# Patient Record
Sex: Male | Born: 1940 | Race: White | Hispanic: No | Marital: Married | Smoking: Current every day smoker
Health system: Southern US, Community
[De-identification: ages and names within clinical notes are randomized; demographics above are authoritative.]

## PROBLEM LIST (undated history)

## (undated) DIAGNOSIS — I10 Essential (primary) hypertension: Secondary | ICD-10-CM

## (undated) DIAGNOSIS — N289 Disorder of kidney and ureter, unspecified: Secondary | ICD-10-CM

## (undated) DIAGNOSIS — C801 Malignant (primary) neoplasm, unspecified: Secondary | ICD-10-CM

## (undated) DIAGNOSIS — E119 Type 2 diabetes mellitus without complications: Secondary | ICD-10-CM

## (undated) DIAGNOSIS — I729 Aneurysm of unspecified site: Secondary | ICD-10-CM

## (undated) HISTORY — PX: JOINT REPLACEMENT: SHX530

## (undated) HISTORY — PX: TOTAL HIP REVISION: SHX763

## (undated) HISTORY — PX: HERNIA REPAIR: SHX51

---

## 2011-06-24 DIAGNOSIS — T84039A Mechanical loosening of unspecified internal prosthetic joint, initial encounter: Secondary | ICD-10-CM | POA: Insufficient documentation

## 2011-06-24 DIAGNOSIS — T84059A Periprosthetic osteolysis of unspecified internal prosthetic joint, initial encounter: Secondary | ICD-10-CM | POA: Insufficient documentation

## 2011-12-24 ENCOUNTER — Ambulatory Visit: Payer: Self-pay

## 2012-01-07 ENCOUNTER — Ambulatory Visit: Payer: Self-pay

## 2012-02-07 ENCOUNTER — Ambulatory Visit: Payer: Self-pay

## 2014-04-14 ENCOUNTER — Inpatient Hospital Stay: Payer: Self-pay | Admitting: Vascular Surgery

## 2014-04-14 LAB — CBC WITH DIFFERENTIAL/PLATELET
Basophil #: 0 10*3/uL (ref 0.0–0.1)
Basophil %: 0.4 %
EOS PCT: 0.1 %
Eosinophil #: 0 10*3/uL (ref 0.0–0.7)
HCT: 34.5 % — ABNORMAL LOW (ref 40.0–52.0)
HGB: 11.3 g/dL — ABNORMAL LOW (ref 13.0–18.0)
LYMPHS ABS: 0.5 10*3/uL — AB (ref 1.0–3.6)
Lymphocyte %: 4.4 %
MCH: 31.1 pg (ref 26.0–34.0)
MCHC: 32.9 g/dL (ref 32.0–36.0)
MCV: 95 fL (ref 80–100)
MONOS PCT: 8.4 %
Monocyte #: 0.9 x10 3/mm (ref 0.2–1.0)
Neutrophil #: 9.4 10*3/uL — ABNORMAL HIGH (ref 1.4–6.5)
Neutrophil %: 86.7 %
Platelet: 142 10*3/uL — ABNORMAL LOW (ref 150–440)
RBC: 3.64 10*6/uL — AB (ref 4.40–5.90)
RDW: 15 % — AB (ref 11.5–14.5)
WBC: 10.9 10*3/uL — AB (ref 3.8–10.6)

## 2014-04-14 LAB — COMPREHENSIVE METABOLIC PANEL
ALBUMIN: 3.2 g/dL — AB (ref 3.4–5.0)
ALK PHOS: 36 U/L — AB
ANION GAP: 10 (ref 7–16)
ANION GAP: 8 (ref 7–16)
Albumin: 2.7 g/dL — ABNORMAL LOW (ref 3.4–5.0)
Alkaline Phosphatase: 44 U/L — ABNORMAL LOW
BILIRUBIN TOTAL: 0.4 mg/dL (ref 0.2–1.0)
BUN: 19 mg/dL — ABNORMAL HIGH (ref 7–18)
BUN: 19 mg/dL — ABNORMAL HIGH (ref 7–18)
Bilirubin,Total: 0.3 mg/dL (ref 0.2–1.0)
CALCIUM: 8.5 mg/dL (ref 8.5–10.1)
CHLORIDE: 108 mmol/L — AB (ref 98–107)
CREATININE: 1.67 mg/dL — AB (ref 0.60–1.30)
Calcium, Total: 7.5 mg/dL — ABNORMAL LOW (ref 8.5–10.1)
Chloride: 105 mmol/L (ref 98–107)
Co2: 26 mmol/L (ref 21–32)
Co2: 25 mmol/L (ref 21–32)
Creatinine: 1.78 mg/dL — ABNORMAL HIGH (ref 0.60–1.30)
EGFR (African American): 48 — ABNORMAL LOW
EGFR (Non-African Amer.): 40 — ABNORMAL LOW
EGFR (Non-African Amer.): 43 — ABNORMAL LOW
GFR CALC AF AMER: 52 — AB
Glucose: 256 mg/dL — ABNORMAL HIGH (ref 65–99)
Glucose: 222 mg/dL — ABNORMAL HIGH (ref 65–99)
OSMOLALITY: 292 (ref 275–301)
OSMOLALITY: 290 (ref 275–301)
POTASSIUM: 3.7 mmol/L (ref 3.5–5.1)
Potassium: 4.7 mmol/L (ref 3.5–5.1)
SGOT(AST): 33 U/L (ref 15–37)
SGOT(AST): 25 U/L (ref 15–37)
SGPT (ALT): 46 U/L
SGPT (ALT): 39 U/L
SODIUM: 141 mmol/L (ref 136–145)
Sodium: 141 mmol/L (ref 136–145)
TOTAL PROTEIN: 6.3 g/dL — AB (ref 6.4–8.2)
Total Protein: 5.5 g/dL — ABNORMAL LOW (ref 6.4–8.2)

## 2014-04-14 LAB — MAGNESIUM: MAGNESIUM: 1.4 mg/dL — AB

## 2014-04-14 LAB — CBC
HCT: 38.6 % — AB (ref 40.0–52.0)
HGB: 12.3 g/dL — ABNORMAL LOW (ref 13.0–18.0)
MCH: 30.8 pg (ref 26.0–34.0)
MCHC: 31.9 g/dL — ABNORMAL LOW (ref 32.0–36.0)
MCV: 97 fL (ref 80–100)
Platelet: 205 10*3/uL (ref 150–440)
RBC: 4.01 10*6/uL — ABNORMAL LOW (ref 4.40–5.90)
RDW: 13.9 % (ref 11.5–14.5)
WBC: 15.2 10*3/uL — AB (ref 3.8–10.6)

## 2014-04-14 LAB — URINALYSIS, COMPLETE
Bacteria: NONE SEEN
Bilirubin,UR: NEGATIVE
Blood: NEGATIVE
Glucose,UR: 50 mg/dL (ref 0–75)
Ketone: NEGATIVE
LEUKOCYTE ESTERASE: NEGATIVE
NITRITE: NEGATIVE
PH: 5 (ref 4.5–8.0)
Protein: 30
SPECIFIC GRAVITY: 1.058 (ref 1.003–1.030)
Squamous Epithelial: NONE SEEN

## 2014-04-14 LAB — LIPASE, BLOOD: Lipase: 169 U/L (ref 73–393)

## 2014-04-14 LAB — PROTIME-INR
INR: 1.1
INR: 1.2
Prothrombin Time: 13.9 secs (ref 11.5–14.7)
Prothrombin Time: 14.7 secs (ref 11.5–14.7)

## 2014-04-15 LAB — CBC WITH DIFFERENTIAL/PLATELET
BASOS ABS: 0 10*3/uL (ref 0.0–0.1)
Basophil %: 0.1 %
EOS ABS: 0 10*3/uL (ref 0.0–0.7)
Eosinophil %: 0 %
HCT: 31 % — AB (ref 40.0–52.0)
HGB: 10 g/dL — AB (ref 13.0–18.0)
Lymphocyte #: 0.9 10*3/uL — ABNORMAL LOW (ref 1.0–3.6)
Lymphocyte %: 6.7 %
MCH: 30.5 pg (ref 26.0–34.0)
MCHC: 32.3 g/dL (ref 32.0–36.0)
MCV: 95 fL (ref 80–100)
MONOS PCT: 11.9 %
Monocyte #: 1.6 x10 3/mm — ABNORMAL HIGH (ref 0.2–1.0)
Neutrophil #: 10.6 10*3/uL — ABNORMAL HIGH (ref 1.4–6.5)
Neutrophil %: 81.3 %
Platelet: 139 10*3/uL — ABNORMAL LOW (ref 150–440)
RBC: 3.27 10*6/uL — ABNORMAL LOW (ref 4.40–5.90)
RDW: 15.1 % — ABNORMAL HIGH (ref 11.5–14.5)
WBC: 13 10*3/uL — ABNORMAL HIGH (ref 3.8–10.6)

## 2014-04-15 LAB — COMPREHENSIVE METABOLIC PANEL
AST: 39 U/L — AB (ref 15–37)
Albumin: 2.7 g/dL — ABNORMAL LOW (ref 3.4–5.0)
Alkaline Phosphatase: 41 U/L — ABNORMAL LOW
Anion Gap: 8 (ref 7–16)
BILIRUBIN TOTAL: 0.6 mg/dL (ref 0.2–1.0)
BUN: 17 mg/dL (ref 7–18)
CHLORIDE: 110 mmol/L — AB (ref 98–107)
CREATININE: 1.43 mg/dL — AB (ref 0.60–1.30)
Calcium, Total: 8 mg/dL — ABNORMAL LOW (ref 8.5–10.1)
Co2: 26 mmol/L (ref 21–32)
GFR CALC NON AF AMER: 51 — AB
Glucose: 171 mg/dL — ABNORMAL HIGH (ref 65–99)
Osmolality: 292 (ref 275–301)
Potassium: 4.6 mmol/L (ref 3.5–5.1)
SGPT (ALT): 29 U/L
Sodium: 144 mmol/L (ref 136–145)
Total Protein: 6.1 g/dL — ABNORMAL LOW (ref 6.4–8.2)

## 2014-04-15 LAB — PROTIME-INR
INR: 1.1
Prothrombin Time: 14.4 secs (ref 11.5–14.7)

## 2014-04-15 LAB — MAGNESIUM: Magnesium: 1.7 mg/dL — ABNORMAL LOW

## 2014-04-16 LAB — CBC WITH DIFFERENTIAL/PLATELET
BASOS ABS: 0 10*3/uL (ref 0.0–0.1)
Basophil %: 0.1 %
EOS ABS: 0 10*3/uL (ref 0.0–0.7)
Eosinophil %: 0.2 %
HCT: 26.9 % — ABNORMAL LOW (ref 40.0–52.0)
HGB: 8.6 g/dL — ABNORMAL LOW (ref 13.0–18.0)
Lymphocyte #: 1 10*3/uL (ref 1.0–3.6)
Lymphocyte %: 8.5 %
MCH: 30.7 pg (ref 26.0–34.0)
MCHC: 31.8 g/dL — AB (ref 32.0–36.0)
MCV: 96 fL (ref 80–100)
Monocyte #: 1.3 x10 3/mm — ABNORMAL HIGH (ref 0.2–1.0)
Monocyte %: 10.8 %
NEUTROS PCT: 80.4 %
Neutrophil #: 9.5 10*3/uL — ABNORMAL HIGH (ref 1.4–6.5)
PLATELETS: 119 10*3/uL — AB (ref 150–440)
RBC: 2.79 10*6/uL — ABNORMAL LOW (ref 4.40–5.90)
RDW: 14.6 % — ABNORMAL HIGH (ref 11.5–14.5)
WBC: 11.8 10*3/uL — ABNORMAL HIGH (ref 3.8–10.6)

## 2014-04-16 LAB — BASIC METABOLIC PANEL
Anion Gap: 5 — ABNORMAL LOW (ref 7–16)
BUN: 22 mg/dL — ABNORMAL HIGH (ref 7–18)
CALCIUM: 8 mg/dL — AB (ref 8.5–10.1)
CHLORIDE: 107 mmol/L (ref 98–107)
CREATININE: 1.46 mg/dL — AB (ref 0.60–1.30)
Co2: 29 mmol/L (ref 21–32)
EGFR (Non-African Amer.): 50 — ABNORMAL LOW
Glucose: 142 mg/dL — ABNORMAL HIGH (ref 65–99)
Osmolality: 287 (ref 275–301)
POTASSIUM: 4.1 mmol/L (ref 3.5–5.1)
SODIUM: 141 mmol/L (ref 136–145)

## 2014-04-16 LAB — MAGNESIUM: Magnesium: 2.3 mg/dL

## 2014-04-17 LAB — BASIC METABOLIC PANEL
Anion Gap: 5 — ABNORMAL LOW (ref 7–16)
BUN: 21 mg/dL — ABNORMAL HIGH (ref 7–18)
Calcium, Total: 8.4 mg/dL — ABNORMAL LOW (ref 8.5–10.1)
Chloride: 100 mmol/L (ref 98–107)
Co2: 32 mmol/L (ref 21–32)
Creatinine: 1.4 mg/dL — ABNORMAL HIGH (ref 0.60–1.30)
GFR CALC NON AF AMER: 53 — AB
GLUCOSE: 147 mg/dL — AB (ref 65–99)
Osmolality: 279 (ref 275–301)
Potassium: 3.8 mmol/L (ref 3.5–5.1)
SODIUM: 137 mmol/L (ref 136–145)

## 2014-04-17 LAB — CBC WITH DIFFERENTIAL/PLATELET
Basophil #: 0 10*3/uL (ref 0.0–0.1)
Basophil %: 0.2 %
EOS ABS: 0 10*3/uL (ref 0.0–0.7)
Eosinophil %: 0.5 %
HCT: 27.6 % — ABNORMAL LOW (ref 40.0–52.0)
HGB: 9.1 g/dL — ABNORMAL LOW (ref 13.0–18.0)
Lymphocyte #: 0.8 10*3/uL — ABNORMAL LOW (ref 1.0–3.6)
Lymphocyte %: 8.4 %
MCH: 31 pg (ref 26.0–34.0)
MCHC: 32.8 g/dL (ref 32.0–36.0)
MCV: 95 fL (ref 80–100)
Monocyte #: 1.1 x10 3/mm — ABNORMAL HIGH (ref 0.2–1.0)
Monocyte %: 11.4 %
NEUTROS ABS: 7.8 10*3/uL — AB (ref 1.4–6.5)
Neutrophil %: 79.5 %
PLATELETS: 134 10*3/uL — AB (ref 150–440)
RBC: 2.92 10*6/uL — ABNORMAL LOW (ref 4.40–5.90)
RDW: 14.2 % (ref 11.5–14.5)
WBC: 9.8 10*3/uL (ref 3.8–10.6)

## 2014-04-19 LAB — BASIC METABOLIC PANEL
Anion Gap: 8 (ref 7–16)
BUN: 26 mg/dL — ABNORMAL HIGH (ref 7–18)
CHLORIDE: 94 mmol/L — AB (ref 98–107)
CO2: 33 mmol/L — AB (ref 21–32)
Calcium, Total: 8.5 mg/dL (ref 8.5–10.1)
Creatinine: 1.47 mg/dL — ABNORMAL HIGH (ref 0.60–1.30)
EGFR (African American): >60
EGFR (Non-African Amer.): 50 — ABNORMAL LOW
GLUCOSE: 208 mg/dL — AB (ref 65–99)
Osmolality: 281 (ref 275–301)
POTASSIUM: 3 mmol/L — AB (ref 3.5–5.1)
Sodium: 135 mmol/L — ABNORMAL LOW (ref 136–145)

## 2014-04-19 LAB — CBC WITH DIFFERENTIAL/PLATELET
BASOS ABS: 0 10*3/uL (ref 0.0–0.1)
Basophil %: 0.1 %
EOS ABS: 0.2 10*3/uL (ref 0.0–0.7)
Eosinophil %: 2.4 %
HCT: 28.3 % — ABNORMAL LOW (ref 40.0–52.0)
HGB: 9.2 g/dL — ABNORMAL LOW (ref 13.0–18.0)
LYMPHS PCT: 8 %
Lymphocyte #: 0.8 10*3/uL — ABNORMAL LOW (ref 1.0–3.6)
MCH: 30.9 pg (ref 26.0–34.0)
MCHC: 32.6 g/dL (ref 32.0–36.0)
MCV: 95 fL (ref 80–100)
Monocyte #: 1.2 x10 3/mm — ABNORMAL HIGH (ref 0.2–1.0)
Monocyte %: 11.4 %
NEUTROS ABS: 8.1 10*3/uL — AB (ref 1.4–6.5)
Neutrophil %: 78.1 %
Platelet: 196 10*3/uL (ref 150–440)
RBC: 2.99 10*6/uL — ABNORMAL LOW (ref 4.40–5.90)
RDW: 14 % (ref 11.5–14.5)
WBC: 10.3 10*3/uL (ref 3.8–10.6)

## 2014-08-07 NOTE — Discharge Summary (Signed)
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   PROCEDURES PERFORMED WHILE IN HOSPITAL: Endovascular repair of a left iliac artery aneurysm with coil embolization of left hypogastric artery and aortobiiliac Excluder stent placement. For full details of that, please see the dictated operative summary.   BRIEF HISTORY OF PRESENT ILLNESS: The patient was admitted the morning of January 7th with acute abdominal and pelvic pain and hypotension. He was found to have a ruptured aneurysm. He was taken emergently to surgery.   HOSPITAL COURSE: The patient was taken emergently to surgery, where he did reasonably well. For full details of that, please see the dictated operative summary. He had an expected postoperative ileus secondary to his retroperitoneal blood and it took about 3 days to advance his diet to regular and increase his activity. He required no blood products after his initial day and his day of surgery. By postoperative day #6, he was afebrile with vital signs stable. He had been walking in the halls. He is tolerating a regular diet and has had regular bowel movements, and his activity had returned to a baseline which was safe for home. He was discharged to home accompanied by his family.   DIET: Regular.   Activity: Will be as tolerated.   RETURN APPOINTMENT: Will be in 3-4 weeks in my office.   HOME MEDICATIONS: Will include lisinopril 20 mg daily, hydrochlorothiazide 25 mg daily, metformin 500 mg b.i.d., multivitamin, vitamin C, and aspirin 325 mg daily.   DISCHARGE INSTRUCTIONS: He is instructed to call or contact or office with any fever greater than 101, wound redness or drainage, severe pain, or other issues.    ____________________________ Algernon Huxley,  MD jsd:mw D: 05/03/2014 15:34:55 ET T: 05/03/2014 17:25:10 ET JOB#: [id number]  cc: Algernon Huxley, MD, <Dictator> Algernon Huxley MD ELECTRONICALLY SIGNED 05/11/2014 11:51

## 2014-08-07 NOTE — Consult Note (Signed)
CHIEF COMPLAINT and HISTORY:  Subjective/Chief Complaint left abdominal and pelvis pain   History of Present Illness Patient presents with one day history of left pelvis and abdominal pain.  He had some nausea and hypotension as well.  Was syncopal at home.  Initial thought it was diverticulitis but CT scan was done and he was found to have a ruptured aneurysm mostly arising from large left iliac aneurysm.  I have reviewed this scan and agree with assessment.  No previous known history of aneurysm.  Awake and alert at current after NS bolus.  Large amount of retroperitoneal blood.   PAST MEDICAL/SURGICAL HISTORY:  Past Medical History:   Kidney Cancer:    Prostate Cancer:    Kidney Stones:    HTN:    Borderline Diabetes:    Knee Surgery - Left:    Hip Replacement - Left:   ALLERGIES:  Allergies:  Sulfa drugs: GI Distress  Aspirin: Other  HOME MEDICATIONS:  Home Medications: Medication Instructions Status  lisinopril 20 mg oral tablet 1 tab(s) orally once a day Active  hydrochlorothiazide 25 mg oral tablet 1 tab(s) orally once a day Active  metformin 500 mg oral tablet 1 tab(s) orally 2 times a day Active  naproxen 250 mg oral tablet 1 tab(s) orally 2 times a day Active  b-12 tablet 1  orally once a day Active  vitamin c extended release 571m  1  orally once a day Active   Family and Social History:  Family History Non-Contributory   Social History positive tobacco (Greater than 1 year), negative ETOH, negative Illicit drugs   Place of Living Home   Review of Systems:  Subjective/Chief Complaint Positive for syncope No TIA/stroke/seizure No heat or cold intolerance No dysuria/hematuria No blurry or double vision No tinnitus or ear pain No rashes or ulcer   Fever/Chills No   Cough No   Sputum No   Abdominal Pain Yes   Diarrhea No   Constipation No   Nausea/Vomiting Yes   SOB/DOE No   Chest Pain No   Telemetry Reviewed NSR   Dysuria No    Tolerating Diet No  Nauseated   Medications/Allergies Reviewed Medications/Allergies reviewed   Physical Exam:  GEN well developed, well nourished, sitting in bed awake and alert.  BP responded to volume   HEENT pink conjunctivae, moist oral mucosa   NECK No masses  trachea midline   RESP normal resp effort  no use of accessory muscles   CARD regular rate  no JVD   VASCULAR ACCESS none   ABD positive tenderness  no hernia  normal BS   GU superpubic tenderness   LYMPH negative neck, negative axillae   EXTR negative cyanosis/clubbing, negative edema   SKIN normal to palpation, skin turgor good   NEURO cranial nerves intact, follows commands, motor/sensory function intact   PSYCH alert, A+O to time, place, person   LABS:  Laboratory Results: Hepatic:    07-Jan-16 00:12, Comprehensive Metabolic Panel  Bilirubin, Total 0.3  Alkaline Phosphatase 44  46-116  NOTE: New Reference Range  10/26/13  SGPT (ALT) 46  14-63  NOTE: New Reference Range  10/26/13  SGOT (AST) 33  Total Protein, Serum 6.3  Albumin, Serum 3.2  Routine Chem:  Glucose, Serum 256  BUN 19  Creatinine (comp) 1.78  Sodium, Serum 141  Potassium, Serum 3.7  Chloride, Serum 105  CO2, Serum 26  Calcium (Total), Serum 8.5  Osmolality (calc) 292  eGFR (African American) 48  eGFR (  Non-African American) 40  eGFR values <37m/min/1.73 m2 may be an indication of chronic  kidney disease (CKD).  Calculated eGFR, using the MRDR Study equation, is useful in   patients with stable renal function.  The eGFR calculation will not be reliable in acutely ill patients  when serum creatinine is changing rapidly. It is not useful in  patients on dialysis. The eGFR calculation may not be applicable  to patients at the low and high extremes of body sizes, pregnant  women, and vegetarians.  Anion Gap 10    07-Jan-16 00:12, Lipase  Lipase 169  Result(s) reported on 14 Apr 2014 at 02:30AM.  Routine Coag:     07-Jan-16 02:12, Prothrombin Time  Prothrombin 13.9  INR 1.1  INR reference interval applies to patients on anticoagulant therapy.  A single INR therapeutic range for coumarins is not optimal for all  indications; however, the suggested range for most indications is  2.0 - 3.0.  Exceptions to the INR Reference Range may include: Prosthetic heart  valves, acute myocardial infarction, prevention of myocardial  infarction, and combinations of aspirin and anticoagulant. The need  for a higher or lower target INR must be assessed individually.  Reference: The Pharmacology and Management of the Vitamin K   antagonists: the seventh ACCP Conference on Antithrombotic and  Thrombolytic Therapy. CGXQJJ.9417Sept:126 (3suppl): [id number]  A HCT value >55% may artifactually increase the PT.  In one study,   the increase was an average of 25%.  Reference:  "Effect on Routine and Special Coagulation Testing Values  of Citrate Anticoagulant Adjustment in Patients with High HCT Values."  American Journal of Clinical Pathology 2006;126:400-405.  Routine Hem:    07-Jan-16 00:12, Hemogram, Platelet Count  WBC (CBC) 15.2  RBC (CBC) 4.01  Hemoglobin (CBC) 12.3  Hematocrit (CBC) 38.6  Platelet Count (CBC) 205  Result(s) reported on 14 Apr 2014 at 02:40AM.  MCV 97  MCH 30.8  MCHC 31.9  RDW 13.9   RADIOLOGY:  Radiology Results: LabUnknown:    07-Jan-16 03:08, CT Angiography Abdomen/Pelvis W/WO Contrast  PACS Image  CT:  CT Angiography Abdomen/Pelvis W/WO Contrast  REASON FOR EXAM:    midline/LLQ pain vomiting hypotension  COMMENTS:       PROCEDURE: CT  - CT ANGIOGRAPHY ABD/PEL W/WO  - Apr 14 2014  3:08AM     CLINICAL DATA:  Left lower abdominal pain. Nausea and vomiting. Pain  began at midnight today. History of prostate cancer with x-ray  therapy.    EXAM:  CTA ABDOMEN AND PELVIS WITHOUT CONTRAST    TECHNIQUE:  Multidetector CT imaging of the abdomen and pelvis was performed  using the  standard protocol during bolus administration of  intravenous contrast. Multiplanar reconstructed images and MIPs were  obtained and reviewed to evaluate the vascular anatomy.    CONTRAST:  80 mL Omnipaque 350    COMPARISON:  None.    FINDINGS:  Images obtained during angiographic phase after administration  intravenouscontrast material demonstrate a large aneurysm arising  from the left common iliac artery measuring about 4.7 cm maximal  diameter. There is extensive retroperitoneal hematoma extending  throughout the left side of the abdomen and pelvis, involving  perirenal, para cell as, and iliopsoas spaces, down to the left  groin and left scrotum. No focal contrast extravasation is noted but  appearance is consistent with acute rupture.    Calcification of the abdominal aorta without additional aneurysm  demonstrated. The abdominal aorta, celiac axis, superior mesenteric  artery, single bilateral renal arteries, and bilateral iliac,  external iliac, internal iliac, and common femoral arteries are  patent.    Diffuse fatty infiltration of the liver. No focal liver lesions  identified. The gallbladder, pancreas, spleen, adrenal glands,  inferior vena cava, and retroperitoneal lymph nodes are  unremarkable. Subcentimeter cysts in the kidneys without evidence of  hydronephrosis. The stomach, small bowel, and colon are  decompressed. No free air in the abdomen.  Pelvis: Prostate gland is enlarged. Prostate calcifications are  present. Bladder wall is mildly thickened, probably due to under  distention. No free pelvic fluid collections. Appendix is normal.  Postoperative changes with left hip arthroplasty. Degenerative  changes in the lumbar spine and right hip. Slight anterior  subluxation of L4 on L5 is likely degenerative. No sclerotic or  destructive bone lesions are appreciated.    Review of the MIP images confirms the above findings.     IMPRESSION:  4.7 cm left iliac  artery aneurysm with acute rupture. Large left  retroperitoneal and pelvic hematoma.    These results were called by telephone at the time of interpretation  on 04/14/2014 at3:11 am to Dr. Marjean Donna , who verbally  acknowledged these results.      Electronically Signed    By: Lucienne Capers M.D.    On: 04/14/2014 03:18         Verified By: Neale Burly, M.D.,   ASSESSMENT AND PLAN:  Assessment/Admission Diagnosis ruptured left iliac aneurysm   Plan this is an emergent, clearly immediate lifethreatening problem and requires emergent repair.  High risk of morbidity and mortality discussed with patient and wife and they desire to proceed.  Will plan stent graft repair and possible coiling of left hypogastric artery.   Electronic Signatures: Algernon Huxley (MD)  (Signed 07-Jan-16 03:52)  Authored: Chief Complaint and History, PAST MEDICAL/SURGICAL HISTORY, ALLERGIES, HOME MEDICATIONS, Family and Social History, Review of Systems, Physical Exam, LABS, RADIOLOGY, Assessment and Plan   Last Updated: 07-Jan-16 03:52 by Algernon Huxley (MD)

## 2014-08-07 NOTE — Op Note (Signed)
PATIENT NAME:  Scott Buckley, Scott Buckley MR#:  [id number] DATE OF BIRTH:  1940-08-25  DATE OF PROCEDURE:  04/14/2014  PREOPERATIVE DIAGNOSES: 1. Ruptured aortoiliac aneurysm.  2. Hypertension.  3. Diabetes.   POSTOPERATIVE DIAGNOSES:  1. Ruptured aortoiliac aneurysm.  2. Hypertension.  3. Diabetes.   PROCEDURES PERFORMED: 1. Ultrasound guidance for vascular access to bilateral femoral arteries.  2. Catheter placement to aorta from right femoral approach.  3. Catheter placement into third order left hypogastric artery branches from left femoral approach.  4. Aortogram and selective left  internal iliac artery angiogram.  5. Coil embolization of the left hypogastric artery and branches with one 18 mm x 14 cm coil and three 16 mm x 14 cm coils.  6. Placement of a Gore Excluder C3 endoprosthesis, primary right with a 28 mm diameter proximal, 12 cm length main body.  7. Right iliac artery extension device with 20 mm diameter x 9.5 cm length.  8. Placement of 2 left iliac artery extender devices, the first with a 14 mm diameter x 12 cm length device to bridge and the second one with a 16 mm diameter x 13 cm device to the left external iliac artery.  9. ProGlide closure device, bilateral femoral arteries.   SURGEON: Jason S dew, MD.   ANESTHESIA: General.   ESTIMATED BLOOD LOSS: Approximately 50 mL.  CONTRAST USED: 85 mL Visipaque.  FLUOROSCOPY TIME: About 14 minutes.   INDICATION FOR PROCEDURE: This is a 74 year old gentleman with abrupt onset of abdominal pain and syncope. He had associated nausea and vomiting. He had a CT scan performed which I have independently reviewed, which suggested a very large left iliac artery aneurysm with a clear rupture and a large amount of blood in the left retroperitoneum. The primary focus of the aneurysm appeared to be in the large left iliac aneurysm. He had ectasia in his right iliac system with diameters in the 17-18 mm range. His aorta, for the most part, was  ectatic, but not frankly aneurysmal until the bifurcation. He was resuscitated with 2 units of blood and saline to get his pressure up in the ER. He was awake and alert at the time of my evaluation, and we recommended emergent surgery for attempt at repair. This was discussed with he, his wife, and his son, who all agreed with an emergent repair. Risks and benefits were discussed. The gravity of the situation was discussed and informed consent was obtained.   DESCRIPTION OF PROCEDURE: The patient is brought to the vascular suite. A general anesthesia  was provided by of our anesthesia colleagues, and the abdomen and groins were sterilely prepped and draped. Ultrasound was used to visualize the femoral arteries bilaterally, and we access both femoral arteries under direct ultrasound guidance with Seldinger needles, and permanent images recorded. The 5 French sheaths were placed. We then placed J wires on each side and performed 2 ProGlide devices in a Perclose fashion in each femoral artery. The 8 French sheaths were then placed. The patient was given 1/2 dose of heparin due to the ruptured aneurysm and 3000 units were given. His pigtail catheter was placed up on the right, and the original aortogram showed a very large left iliac aneurysm with extravasation. This aneurysm, both by CT and angiogram, extended down to the bifurcation of the iliac vessel and to avoid endoleak, coil embolization of left hypogastric artery was quickly performed. I was able to easily cannulate the left hypogastric artery from the left femoral approach using a  rim catheter. I advanced initially a glide catheter, but then exchanged for a rim catheter for a more sturdy catheter, out in the secondary hypogastric artery branches and actually, into a tertiary hypogastric artery branch, which was the largest branch of the hypogastric artery. I then delivered 4 coils, starting out in the secondary hypogastric artery branches up into the main  hypogastric artery with packing the coils at the primary bifurcation in the hypogastric artery. A total of 4 coils were used, one 18 mm coil and three 16 mm coils, all were 14 cm in length. With this, there appeared to be a good coil embolization of the hypogastric artery, and I advanced a wire and catheter up into the aorta from the left femoral approach. I exchanged for a Kumpe catheter at this location, a 12 French sheath was placed up on the left and an 18 French sheath was placed up on the right, over an Amplatz Super Stiff wires. The primary device was placed through the right femoral sheath. This was a 28.5 mm diameter proximal, 12 cm length main body right. Due to the ectasia and slight aneurysmal degeneration of the right iliac artery proximally, a 20 mm extension cuff was planned down to the iliac bifurcation on the right. We deployed just below the left renal artery, which was lower, after imaging through the Kumpe catheter. We deployed the device in a native configuration, and I easily cannulated the contralateral gate with a Kumpe catheter and Glidewire, and confirmed successful cannulation by twirling the pigtail catheter in the main body. With the pigtail catheter in, the Amplatz Super Stiff wire was placed. This was a very long left iliac. We did not have a long enough left iliac limb to get down to the external iliac artery, where we gain seals, so I needed 2 devices, a 14 mm diameter x 12 cm length device was deployed initially from the flow divider into the left common iliac artery, a second device using a 16 mm diameter x 13 cm length left iliac limb was deployed down about 3-4 cm into the external iliac artery, beyond the iliac bifurcation. This allowed [place] to seal the aneurysm on the left. I then performed retrograde arteriogram from the right femoral sheath, completed the deployment of the main body, and then selected a 20 mm diameter, right iliac limb. This was 9.5 cm in length. This was  deployed, going about 3-4 cm into the right iliac limb of the main body and terminating just above the right hypogastric artery. All junction points and seal zones were then ironed out with the compliant balloon, and a pigtail catheter was placed back up the left. Completion angiogram was performed. This demonstrated excellent flow through the stent graft. Both renal arteries are patent, and the right hypogastric artery was patent, the left hypogastric artery was excluded from the stent, and the coils were in excellent location. The stent graft was widely patent with good flow into both iliac vessels. There were no endoleaks identified at completion. At this point, i elected to terminate  the procedure. The sheaths were removed. We completed the ProGlide closures, first on the left, then on the right. The skin was closed with 4-0 Monocryl. Dermabond, and pressure dressings were placed. The patient tolerated the procedure well and was taken to the recovery room in stable condition.    ____________________________ Algernon Huxley, MD jsd:mw D: 04/14/2014 06:28:19 ET T: 04/14/2014 11:41:48 ET JOB#: [id number]  cc: Algernon Huxley, MD, <Dictator> Shanon Brow  Sibyl Parr, MD Algernon Huxley MD ELECTRONICALLY SIGNED 04/19/2014 13:39

## 2014-10-11 DIAGNOSIS — C61 Malignant neoplasm of prostate: Secondary | ICD-10-CM | POA: Insufficient documentation

## 2015-11-02 DIAGNOSIS — G8929 Other chronic pain: Secondary | ICD-10-CM | POA: Insufficient documentation

## 2015-11-02 DIAGNOSIS — M545 Low back pain, unspecified: Secondary | ICD-10-CM | POA: Insufficient documentation

## 2016-01-01 ENCOUNTER — Other Ambulatory Visit: Payer: Self-pay | Admitting: Infectious Diseases

## 2016-01-01 DIAGNOSIS — N183 Chronic kidney disease, stage 3 unspecified: Secondary | ICD-10-CM

## 2016-01-01 DIAGNOSIS — N200 Calculus of kidney: Secondary | ICD-10-CM | POA: Insufficient documentation

## 2016-01-01 DIAGNOSIS — Z9889 Other specified postprocedural states: Principal | ICD-10-CM

## 2016-01-01 DIAGNOSIS — Z87442 Personal history of urinary calculi: Secondary | ICD-10-CM

## 2016-01-01 DIAGNOSIS — N23 Unspecified renal colic: Secondary | ICD-10-CM

## 2016-01-15 ENCOUNTER — Other Ambulatory Visit: Payer: Self-pay | Admitting: Physical Medicine and Rehabilitation

## 2016-01-15 DIAGNOSIS — M5416 Radiculopathy, lumbar region: Secondary | ICD-10-CM

## 2016-01-26 ENCOUNTER — Ambulatory Visit
Admission: RE | Admit: 2016-01-26 | Discharge: 2016-01-26 | Disposition: A | Discharge status: Home / Self Care | Payer: Medicare HMO | Source: Ambulatory Visit | Attending: Physical Medicine and Rehabilitation | Admitting: Physical Medicine and Rehabilitation

## 2016-01-26 DIAGNOSIS — M4316 Spondylolisthesis, lumbar region: Secondary | ICD-10-CM | POA: Insufficient documentation

## 2016-01-26 DIAGNOSIS — M5136 Other intervertebral disc degeneration, lumbar region: Secondary | ICD-10-CM | POA: Insufficient documentation

## 2016-01-26 DIAGNOSIS — M4686 Other specified inflammatory spondylopathies, lumbar region: Secondary | ICD-10-CM | POA: Insufficient documentation

## 2016-01-26 DIAGNOSIS — M5416 Radiculopathy, lumbar region: Secondary | ICD-10-CM | POA: Diagnosis not present

## 2016-01-26 DIAGNOSIS — M48061 Spinal stenosis, lumbar region without neurogenic claudication: Secondary | ICD-10-CM | POA: Insufficient documentation

## 2016-02-23 IMAGING — CT CT ANGIO ABD-PELV WO/W CM
2 of 5 series · 15 of 46 positions shown, 18 images · IV contrast (APPLIED)
Comparison: None.

CLINICAL DATA: Left lower abdominal pain. Nausea and vomiting. Pain
began at midnight today. History of prostate cancer with x-ray
therapy.

EXAM:
CTA ABDOMEN AND PELVIS WITHOUT CONTRAST
TECHNIQUE: Multidetector CT imaging of the abdomen and pelvis was performed
using the standard protocol during bolus administration of
intravenous contrast. Multiplanar reconstructed images and MIPs were
obtained and reviewed to evaluate the vascular anatomy.
CONTRAST:  80 mL Omnipaque 350

[Series 4: arterial · axial · arterial · 0.97mm/px · z∈[-522,-46]mm · 12 of 264 slices shown, 15 images]
[im 17/264  soft-tissue]
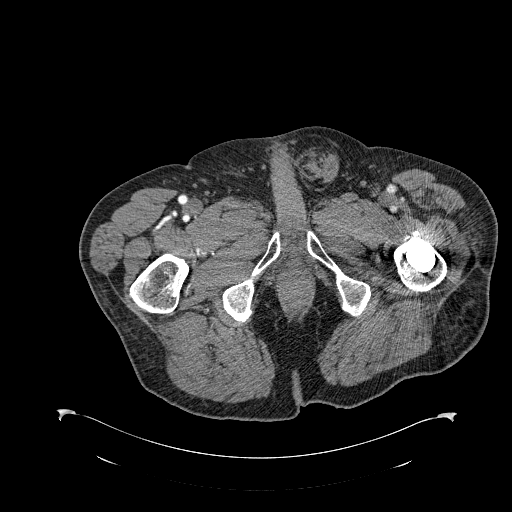
[im 17/264  bone]
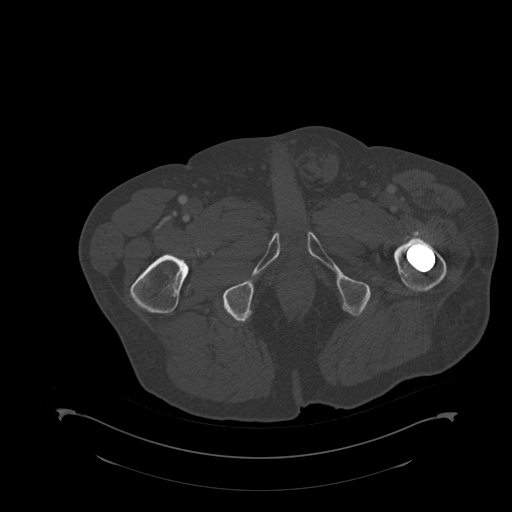
[im 51/264  soft-tissue]
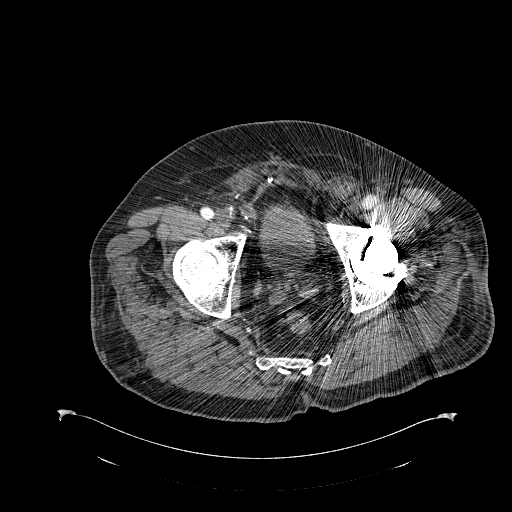
[im 77/264  soft-tissue]
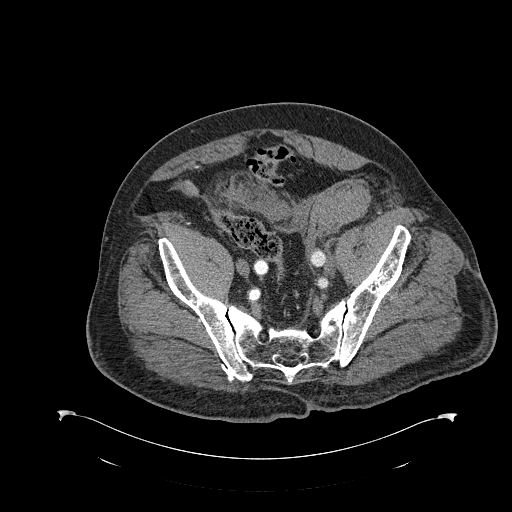
[im 102/264  soft-tissue]
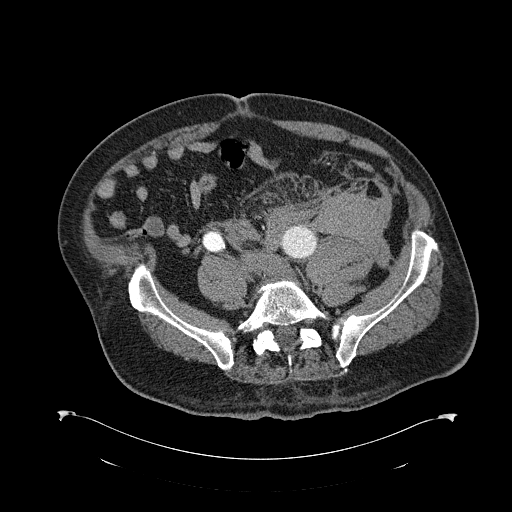
[im 136/264  soft-tissue]
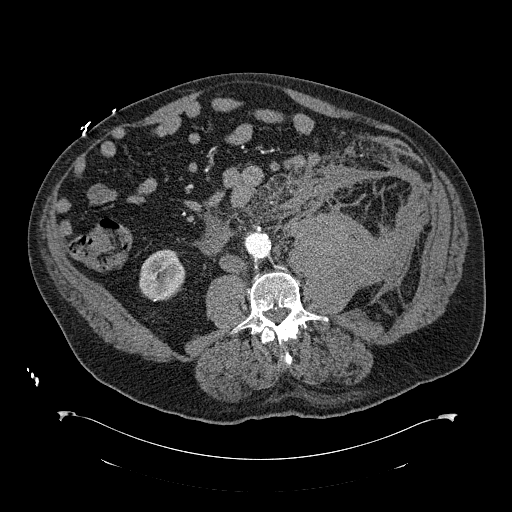
[im 162/264  soft-tissue]
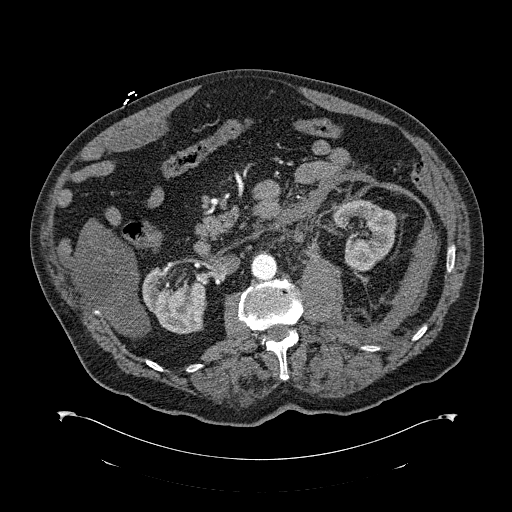
[im 187/264  soft-tissue]
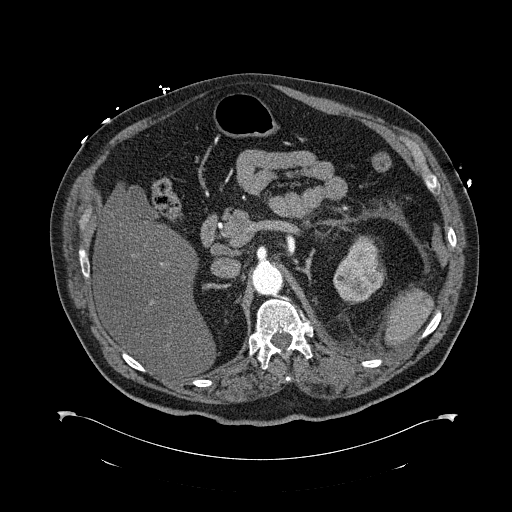
[im 221/264  soft-tissue]
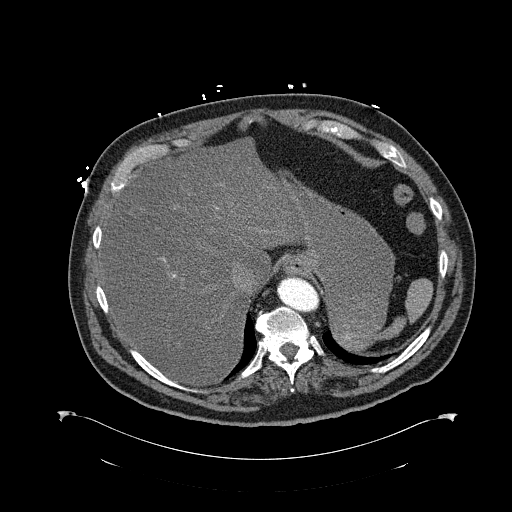
[im 230/264  lung]
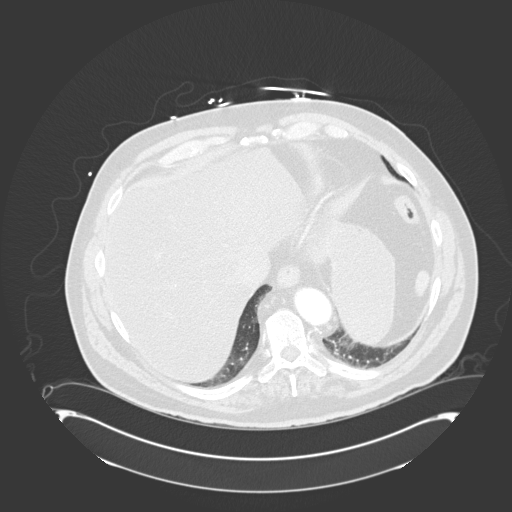
[im 238/264  lung]
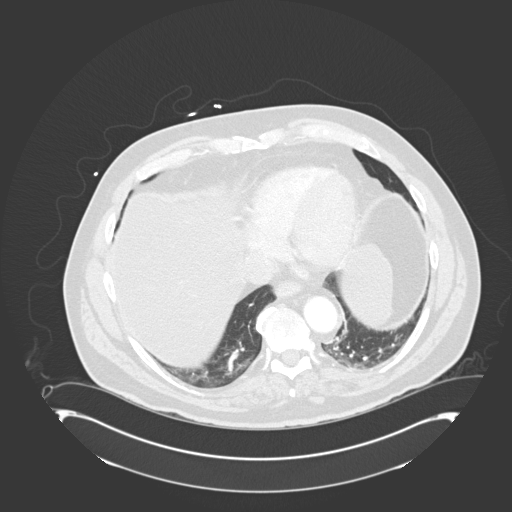
[im 247/264  soft-tissue]
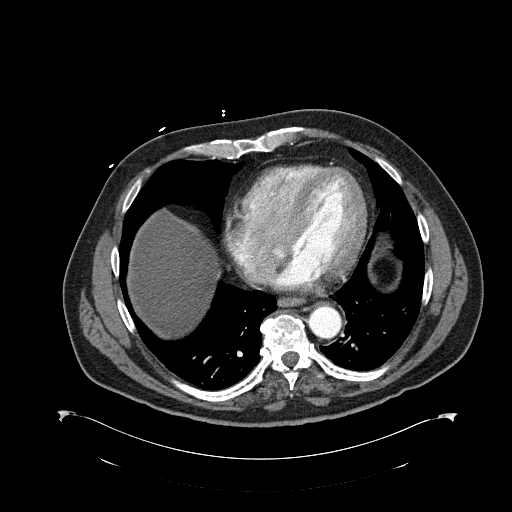
[im 247/264  lung]
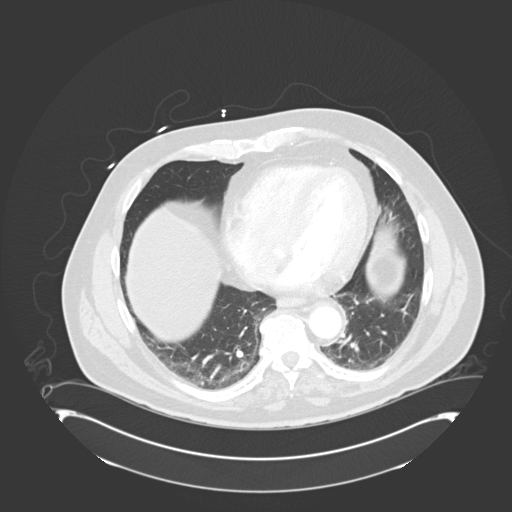
[im 247/264  bone]
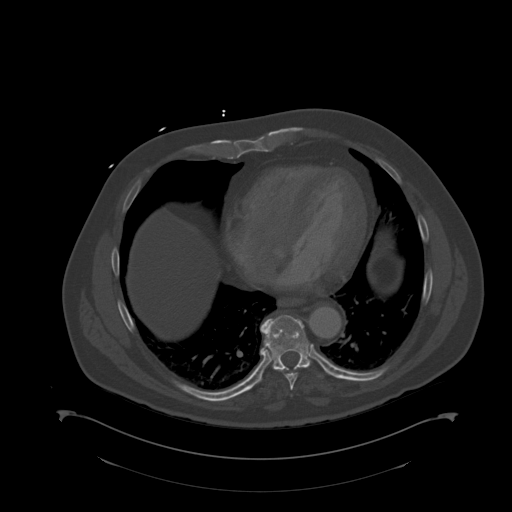
[im 255/264  lung]
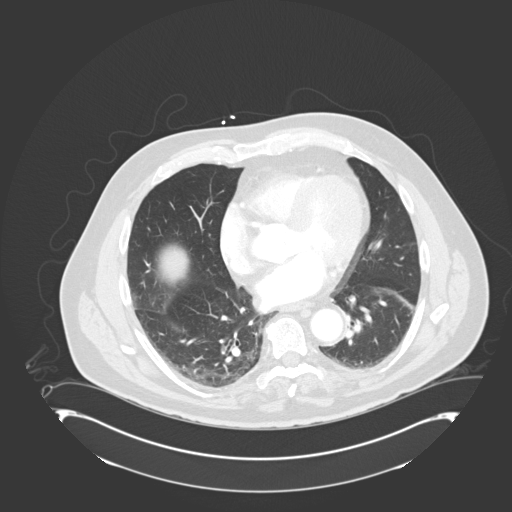

[Series 6: cor arterial mpr cor · coronal · arterial · 1.07mm/px · 3 of 174 slices shown]
[im 44/174  soft-tissue]
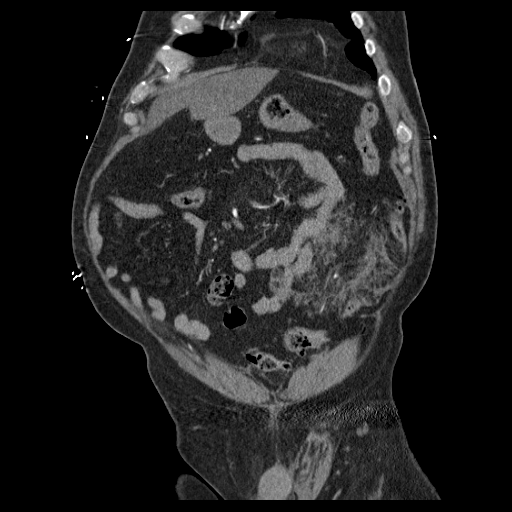
[im 87/174  soft-tissue]
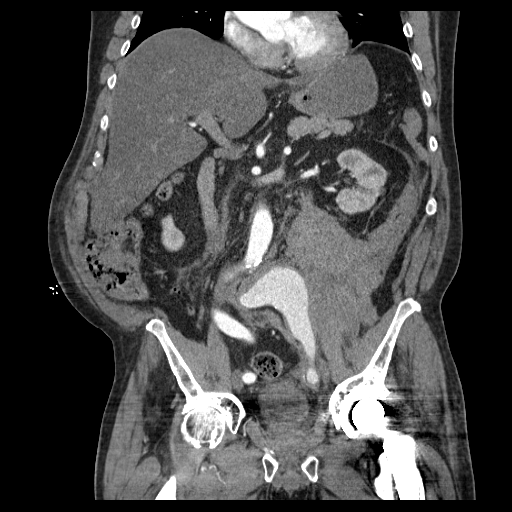
[im 130/174  soft-tissue]
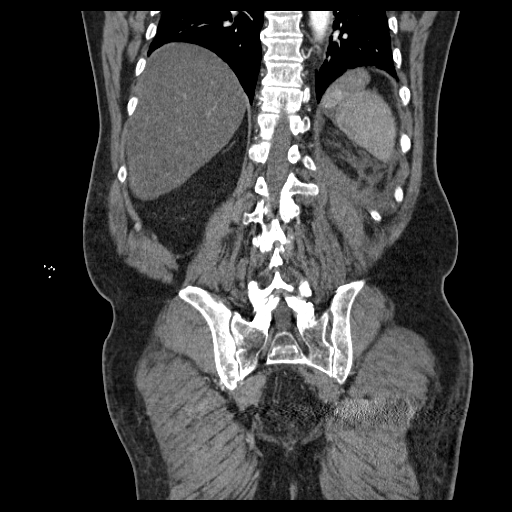

[15 of 46 positions shown; findings below may reference images not displayed]

FINDINGS: Images obtained during angiographic phase after administration
intravenous contrast material demonstrate a large aneurysm arising
from the left common iliac artery measuring about 4.7 cm maximal
diameter. There is extensive retroperitoneal hematoma extending
throughout the left side of the abdomen and pelvis, involving
perirenal, para cell as, and iliopsoas spaces, down to the left
groin and left scrotum. No focal contrast extravasation is noted but
appearance is consistent with acute rupture.

Calcification of the abdominal aorta without additional aneurysm
demonstrated. The abdominal aorta, celiac axis, superior mesenteric
artery, single bilateral renal arteries, and bilateral iliac,
external iliac, internal iliac, and common femoral arteries are
patent.

Diffuse fatty infiltration of the liver. No focal liver lesions
identified. The gallbladder, pancreas, spleen, adrenal glands,
inferior vena cava, and retroperitoneal lymph nodes are
unremarkable. Subcentimeter cysts in the kidneys without evidence of
hydronephrosis. The stomach, small bowel, and colon are
decompressed. No free air in the abdomen.

Pelvis: Prostate gland is enlarged. Prostate calcifications are
present. Bladder wall is mildly thickened, probably due to under
distention. No free pelvic fluid collections. Appendix is normal.
Postoperative changes with left hip arthroplasty. Degenerative
changes in the lumbar spine and right hip. Slight anterior
subluxation of L4 on L5 is likely degenerative. No sclerotic or
destructive bone lesions are appreciated.

Review of the MIP images confirms the above findings.
IMPRESSION: 4.7 cm left iliac artery aneurysm with acute rupture. Large left
retroperitoneal and pelvic hematoma.

These results were called by telephone at the time of interpretation
on 04/14/2014 at [DATE] to Dr. PETIT SAMUEL SERGINE , who verbally
acknowledged these results.

## 2016-07-01 ENCOUNTER — Inpatient Hospital Stay
Admission: EM | Admit: 2016-07-01 | Discharge: 2016-07-03 | DRG: 872 | Disposition: A | Discharge status: Home / Self Care | Payer: MEDICARE | Attending: Specialist | Admitting: Specialist

## 2016-07-01 ENCOUNTER — Emergency Department: Payer: MEDICARE

## 2016-07-01 DIAGNOSIS — E119 Type 2 diabetes mellitus without complications: Secondary | ICD-10-CM

## 2016-07-01 DIAGNOSIS — Z882 Allergy status to sulfonamides status: Secondary | ICD-10-CM | POA: Diagnosis not present

## 2016-07-01 DIAGNOSIS — I129 Hypertensive chronic kidney disease with stage 1 through stage 4 chronic kidney disease, or unspecified chronic kidney disease: Secondary | ICD-10-CM | POA: Diagnosis present

## 2016-07-01 DIAGNOSIS — R531 Weakness: Secondary | ICD-10-CM | POA: Diagnosis present

## 2016-07-01 DIAGNOSIS — N390 Urinary tract infection, site not specified: Secondary | ICD-10-CM | POA: Diagnosis not present

## 2016-07-01 DIAGNOSIS — F17290 Nicotine dependence, other tobacco product, uncomplicated: Secondary | ICD-10-CM | POA: Diagnosis present

## 2016-07-01 DIAGNOSIS — N183 Chronic kidney disease, stage 3 unspecified: Secondary | ICD-10-CM | POA: Diagnosis present

## 2016-07-01 DIAGNOSIS — W19XXXA Unspecified fall, initial encounter: Secondary | ICD-10-CM | POA: Diagnosis not present

## 2016-07-01 DIAGNOSIS — A419 Sepsis, unspecified organism: Secondary | ICD-10-CM | POA: Diagnosis not present

## 2016-07-01 DIAGNOSIS — Z794 Long term (current) use of insulin: Secondary | ICD-10-CM

## 2016-07-01 DIAGNOSIS — I10 Essential (primary) hypertension: Secondary | ICD-10-CM | POA: Diagnosis present

## 2016-07-01 DIAGNOSIS — I959 Hypotension, unspecified: Secondary | ICD-10-CM | POA: Diagnosis not present

## 2016-07-01 DIAGNOSIS — E1122 Type 2 diabetes mellitus with diabetic chronic kidney disease: Secondary | ICD-10-CM | POA: Diagnosis not present

## 2016-07-01 DIAGNOSIS — N1831 Chronic kidney disease, stage 3a: Secondary | ICD-10-CM | POA: Diagnosis present

## 2016-07-01 DIAGNOSIS — Y92002 Bathroom of unspecified non-institutional (private) residence single-family (private) house as the place of occurrence of the external cause: Secondary | ICD-10-CM

## 2016-07-01 HISTORY — DX: Disorder of kidney and ureter, unspecified: N28.9

## 2016-07-01 HISTORY — DX: Malignant (primary) neoplasm, unspecified: C80.1

## 2016-07-01 HISTORY — DX: Type 2 diabetes mellitus without complications: E11.9

## 2016-07-01 HISTORY — DX: Essential (primary) hypertension: I10

## 2016-07-01 HISTORY — DX: Aneurysm of unspecified site: I72.9

## 2016-07-01 LAB — COMPREHENSIVE METABOLIC PANEL
ALT: 35 U/L (ref 17–63)
ANION GAP: 10 (ref 5–15)
AST: 38 U/L (ref 15–41)
Albumin: 3.8 g/dL (ref 3.5–5.0)
Alkaline Phosphatase: 34 U/L — ABNORMAL LOW (ref 38–126)
BUN: 24 mg/dL — ABNORMAL HIGH (ref 6–20)
CHLORIDE: 101 mmol/L (ref 101–111)
CO2: 23 mmol/L (ref 22–32)
CREATININE: 1.55 mg/dL — AB (ref 0.61–1.24)
Calcium: 9.3 mg/dL (ref 8.9–10.3)
GFR, EST AFRICAN AMERICAN: 48 mL/min — AB (ref >60)
GFR, EST NON AFRICAN AMERICAN: 41 mL/min — AB (ref >60)
Glucose, Bld: 189 mg/dL — ABNORMAL HIGH (ref 65–99)
Potassium: 4.2 mmol/L (ref 3.5–5.1)
Sodium: 134 mmol/L — ABNORMAL LOW (ref 135–145)
Total Bilirubin: 0.7 mg/dL (ref 0.3–1.2)
Total Protein: 7.5 g/dL (ref 6.5–8.1)

## 2016-07-01 LAB — URINALYSIS, COMPLETE (UACMP) WITH MICROSCOPIC
Bilirubin Urine: NEGATIVE
Glucose, UA: NEGATIVE mg/dL
Hgb urine dipstick: NEGATIVE
KETONES UR: NEGATIVE mg/dL
LEUKOCYTES UA: NEGATIVE
Nitrite: NEGATIVE
PROTEIN: NEGATIVE mg/dL
Specific Gravity, Urine: 1.017 (ref 1.005–1.030)
pH: 5 (ref 5.0–8.0)

## 2016-07-01 LAB — CBC
HEMATOCRIT: 41.6 % (ref 40.0–52.0)
Hemoglobin: 14.1 g/dL (ref 13.0–18.0)
MCH: 31.7 pg (ref 26.0–34.0)
MCHC: 33.9 g/dL (ref 32.0–36.0)
MCV: 93.6 fL (ref 80.0–100.0)
Platelets: 175 10*3/uL (ref 150–440)
RBC: 4.45 MIL/uL (ref 4.40–5.90)
RDW: 13.4 % (ref 11.5–14.5)
WBC: 17.3 10*3/uL — AB (ref 3.8–10.6)

## 2016-07-01 LAB — TROPONIN I: Troponin I: <0.03 ng/mL (ref <0.03)

## 2016-07-01 LAB — LACTIC ACID, PLASMA
LACTIC ACID, VENOUS: 2.4 mmol/L — AB (ref 0.5–1.9)
LACTIC ACID, VENOUS: 2.1 mmol/L — AB (ref 0.5–1.9)

## 2016-07-01 LAB — GLUCOSE, CAPILLARY: GLUCOSE-CAPILLARY: 162 mg/dL — AB (ref 65–99)

## 2016-07-01 MED ORDER — PIPERACILLIN-TAZOBACTAM 3.375 G IVPB
3.3750 g | Freq: Three times a day (TID) | INTRAVENOUS | Status: DC
  Administered 2016-07-02 – 2016-07-03 (×4): 3.375 g via INTRAVENOUS
  Filled 2016-07-01 (×4): qty 50

## 2016-07-01 MED ORDER — SODIUM CHLORIDE 0.9 % IV SOLN
INTRAVENOUS | Status: AC
  Administered 2016-07-02: via INTRAVENOUS

## 2016-07-01 MED ORDER — VANCOMYCIN HCL 10 G IV SOLR
1250.0000 mg | Freq: Two times a day (BID) | INTRAVENOUS | Status: DC
  Administered 2016-07-02: 1250 mg via INTRAVENOUS
  Filled 2016-07-01 (×4): qty 1250

## 2016-07-01 MED ORDER — PIPERACILLIN-TAZOBACTAM 3.375 G IVPB 30 MIN
3.3750 g | Freq: Once | INTRAVENOUS | Status: AC
  Administered 2016-07-01: 3.375 g via INTRAVENOUS
  Filled 2016-07-01: qty 50

## 2016-07-01 MED ORDER — ONDANSETRON HCL 4 MG PO TABS: 4.0000 mg | ORAL_TABLET | Freq: Four times a day (QID) | ORAL | Status: DC | PRN

## 2016-07-01 MED ORDER — ACETAMINOPHEN 500 MG PO TABS
1000.0000 mg | ORAL_TABLET | Freq: Once | ORAL | Status: AC
  Administered 2016-07-01: 1000 mg via ORAL

## 2016-07-01 MED ORDER — VANCOMYCIN HCL IN DEXTROSE 1-5 GM/200ML-% IV SOLN
1000.0000 mg | Freq: Once | INTRAVENOUS | Status: AC
  Administered 2016-07-01: 1000 mg via INTRAVENOUS
  Filled 2016-07-01: qty 200

## 2016-07-01 MED ORDER — ACETAMINOPHEN 325 MG PO TABS
650.0000 mg | ORAL_TABLET | Freq: Four times a day (QID) | ORAL | Status: DC | PRN
  Administered 2016-07-03: 650 mg via ORAL
  Filled 2016-07-01: qty 2

## 2016-07-01 MED ORDER — ACETAMINOPHEN 500 MG PO TABS
ORAL_TABLET | ORAL | Status: AC
  Administered 2016-07-01: 1000 mg via ORAL
  Filled 2016-07-01: qty 2

## 2016-07-01 MED ORDER — ONDANSETRON HCL 4 MG/2ML IJ SOLN: 4.0000 mg | Freq: Four times a day (QID) | INTRAMUSCULAR | Status: DC | PRN

## 2016-07-01 MED ORDER — ENOXAPARIN SODIUM 40 MG/0.4ML ~~LOC~~ SOLN
40.0000 mg | SUBCUTANEOUS | Status: DC
  Administered 2016-07-02 (×2): 40 mg via SUBCUTANEOUS
  Filled 2016-07-01 (×2): qty 0.4

## 2016-07-01 MED ORDER — SODIUM CHLORIDE 0.9 % IV BOLUS (SEPSIS)
1000.0000 mL | Freq: Once | INTRAVENOUS | Status: AC
  Administered 2016-07-01: 1000 mL via INTRAVENOUS

## 2016-07-01 MED ORDER — INSULIN ASPART 100 UNIT/ML ~~LOC~~ SOLN: 0.0000 [IU] | Freq: Every day | SUBCUTANEOUS | Status: DC

## 2016-07-01 MED ORDER — INSULIN ASPART 100 UNIT/ML ~~LOC~~ SOLN
0.0000 [IU] | Freq: Three times a day (TID) | SUBCUTANEOUS | Status: DC
  Administered 2016-07-02: 2 [IU] via SUBCUTANEOUS
  Administered 2016-07-02 – 2016-07-03 (×2): 1 [IU] via SUBCUTANEOUS
  Filled 2016-07-01: qty 1
  Filled 2016-07-01: qty 2
  Filled 2016-07-01: qty 1

## 2016-07-01 MED ORDER — ACETAMINOPHEN 650 MG RE SUPP: 650.0000 mg | Freq: Four times a day (QID) | RECTAL | Status: DC | PRN

## 2016-07-01 NOTE — ED Notes (Signed)
Informed secretary of [employer] for pt per MD.

## 2016-07-01 NOTE — ED Notes (Addendum)
Pt reports hx of frequent episodes of "diarrhea for many years."   Pt reports medicine change from "metformin to Glimepiride 3 months ago to see if that helps ease up diarrhea since metformin has caused me to have frequent diarrhea."

## 2016-07-01 NOTE — H&P (Signed)
[facility] NAME: Scott Buckley    MR#:  [id number]  DATE OF BIRTH:  03-29-1941  DATE OF ADMISSION:  07/01/2016  PRIMARY CARE PHYSICIAN: No PCP Per Patient   REQUESTING/REFERRING PHYSICIAN: Archie Balboa, MD  CHIEF COMPLAINT:   Chief Complaint  Patient presents with  . Weakness  . Fall    HISTORY OF PRESENT ILLNESS:  Scott Buckley  is a 76 y.o. male who presents with Weakness and urinary frequency. Patient states that he is been having some urinary frequency over the past couple of days, but that today he went to get up out of his chair and felt significantly weak. He then had a fall in the bathroom and was unable to stand up on his legs due to his weakness. Here in the ED he was found to have likely UTI, and met sepsis criteria. Hospitalists were called for admission  PAST MEDICAL HISTORY:   Past Medical History:  Diagnosis Date  . Aneurysm ([place])   . Cancer ([place])   . Diabetes mellitus without complication ([place])   . Hypertension   . Renal disorder     PAST SURGICAL HISTORY:   Past Surgical History:  Procedure Laterality Date  . HERNIA REPAIR    . JOINT REPLACEMENT    . TOTAL HIP REVISION      SOCIAL HISTORY:   Social History  Substance Use Topics  . Smoking status: Light Tobacco Smoker    Types: Cigars  . Smokeless tobacco: Never Used  . Alcohol use Yes     Comment: "3-4oz of liquor a day'    FAMILY HISTORY:  No family history on file.  DRUG ALLERGIES:   Allergies  Allergen Reactions  . Sulfa Antibiotics Nausea And Vomiting    MEDICATIONS AT HOME:   Prior to Admission medications   Not on File    REVIEW OF SYSTEMS:  Review of Systems  Constitutional: Negative for chills, fever, malaise/fatigue and weight loss.  HENT: Negative for ear pain, hearing loss and tinnitus.   Eyes: Negative for blurred vision, double vision, pain and redness.  Respiratory: Negative for cough, hemoptysis and  shortness of breath.   Cardiovascular: Negative for chest pain, palpitations, orthopnea and leg swelling.  Gastrointestinal: Negative for abdominal pain, constipation, diarrhea, nausea and vomiting.  Genitourinary: Positive for frequency. Negative for dysuria and hematuria.  Musculoskeletal: Negative for back pain, joint pain and neck pain.  Skin:       No acne, rash, or lesions  Neurological: Positive for weakness. Negative for dizziness, tremors and focal weakness.  Endo/Heme/Allergies: Negative for polydipsia. Does not bruise/bleed easily.  Psychiatric/Behavioral: Negative for depression. The patient is not nervous/anxious and does not have insomnia.      VITAL SIGNS:   Vitals:   07/01/16 1909 07/01/16 2130 07/01/16 2131 07/01/16 2131  BP:  136/74  136/74  Pulse:  (!) 107 (!) 109 (!) 108  Resp:  16  17  Temp:      TempSrc:      SpO2:   96% 96%  Weight: 111.1 kg (245 lb)     Height: _0  (1.93 m)      Wt Readings from Last 3 Encounters:  07/01/16 111.1 kg (245 lb)    PHYSICAL EXAMINATION:  Physical Exam  Vitals reviewed. Constitutional: He is oriented to person, place, and time. He appears well-developed and well-nourished. No distress.  HENT:  Head: Normocephalic and atraumatic.  Mouth/Throat: Oropharynx is clear and  moist.  Eyes: Conjunctivae and EOM are normal. Pupils are equal, round, and reactive to light. No scleral icterus.  Neck: Normal range of motion. Neck supple. No JVD present. No thyromegaly present.  Cardiovascular: Normal rate, regular rhythm and intact distal pulses.  Exam reveals no gallop and no friction rub.   No murmur heard. Respiratory: Effort normal and breath sounds normal. No respiratory distress. He has no wheezes. He has no rales.  GI: Soft. Bowel sounds are normal. He exhibits no distension. There is no tenderness.  Musculoskeletal: Normal range of motion. He exhibits no edema.  No arthritis, no gout  Lymphadenopathy:    He has no cervical  adenopathy.  Neurological: He is alert and oriented to person, place, and time. No cranial nerve deficit.  No dysarthria, no aphasia  Skin: Skin is warm and dry. No rash noted. No erythema.  Psychiatric: He has a normal mood and affect. His behavior is normal. Judgment and thought content normal.    LABORATORY PANEL:   CBC  Recent Labs Lab 07/01/16 1938  WBC 17.3*  HGB 14.1  HCT 41.6  PLT 175   ------------------------------------------------------------------------------------------------------------------  Chemistries   Recent Labs Lab 07/01/16 1938  NA 134*  K 4.2  CL 101  CO2 23  GLUCOSE 189*  BUN 24*  CREATININE 1.55*  CALCIUM 9.3  AST 38  ALT 35  ALKPHOS 34*  BILITOT 0.7   ------------------------------------------------------------------------------------------------------------------  Cardiac Enzymes  Recent Labs Lab 07/01/16 1938  TROPONINI <0.03   ------------------------------------------------------------------------------------------------------------------  RADIOLOGY:  Dg Chest Port 1 View  Result Date: 07/01/2016 CLINICAL DATA:  Weakness and fever.  Status post fall from standing. EXAM: PORTABLE CHEST 1 VIEW COMPARISON:  None. FINDINGS: The heart size and mediastinal contours are within normal limits. Both lungs are clear. The visualized skeletal structures are unremarkable. IMPRESSION: No active disease. Electronically Signed   By: Kathreen Devoid   On: 07/01/2016 19:47    EKG:   Orders placed or performed during the hospital encounter of 07/01/16  . ED EKG  . ED EKG  . EKG 12-Lead  . EKG 12-Lead    IMPRESSION AND PLAN:  Principal Problem:   Sepsis ([place]) - duties UTI as below, broad-spectrum antibiotics initiated, lactic acid was elevated we will administer IV fluids and monitor serially, patient is hemodynamically stable, cultures sent from the ED. Active Problems:   UTI (urinary tract infection) - antibiotics and cultures as above    HTN (hypertension) - stable, continue home meds   Diabetes ([place]) - sliding scale insulin with corresponding glucose checks   CKD (chronic kidney disease), stage III - at baseline, avoid nephrotoxins and monitor  All the records are reviewed and case discussed with ED provider. Management plans discussed with the patient and/or family.  DVT PROPHYLAXIS: SubQ lovenox  GI PROPHYLAXIS: None  ADMISSION STATUS: Inpatient  CODE STATUS: Full Code Status History    This patient does not have a recorded code status. Please follow your organizational policy for patients in this situation.    Advance Directive Documentation     Most Recent Value  Type of Advance Directive  Living will, Healthcare Power of Attorney  Pre-existing out of facility DNR order (yellow form or pink MOST form)  -  "MOST" Form in Place?  -      TOTAL TIME TAKING CARE OF THIS PATIENT: 45 minutes.    Dyann Goodspeed [place] 07/01/2016, 9:47 PM  [facility]  Office  [phone]  CC: Primary care physician; No  PCP Per Patient

## 2016-07-01 NOTE — ED Triage Notes (Addendum)
Per EMS, pt fell when getting out of shower. Pt reports leg weakness and being unsteady since this am. Pt denies LOC or inj. EMS reports pt's HR being elevated in the 130's, on arrival pt's HR is 125bpm. Pt A&O at this time.

## 2016-07-01 NOTE — ED Notes (Signed)
Pt given sandwich tray and drink per EDP.

## 2016-07-01 NOTE — Progress Notes (Signed)
Pharmacy Antibiotic Note  Scott Buckley is a 76 y.o. male admitted on 07/01/2016 with sepsis.  Pharmacy has been consulted for vancomycin/zosyn dosing.  Plan: Patient received vancomycin 1g IV x 1 and zosyn 3.375g IV x 1 in the ED. Will start a maintenance dose of 1.25g IV q12h w/ 6 hour stack dosing and zosyn 3.375g IV q8h. Will check a vanc trough 3/28 @ 0200 prior to 3rd dose to ensure clearance.  Height: 6\' 4"  (193 cm) Weight: 245 lb (111.1 kg) IBW/kg (Calculated) : 86.8  Temp (24hrs), Avg:99.7 F (37.6 C), Min:98.8 F (37.1 C), Max:100.5 F (38.1 C)   Recent Labs Lab 07/01/16 1938 07/01/16 2202  WBC 17.3*  --   CREATININE 1.55*  --   LATICACIDVEN 2.4* 2.1*    Estimated Creatinine Clearance: 54.5 mL/min (A) (by C-G formula based on SCr of 1.55 mg/dL (H)).    Allergies  Allergen Reactions  . Sulfa Antibiotics Nausea And Vomiting    Thank you for allowing pharmacy to be a part of this patient's care.  Tobie Lords, PharmD, BCPS Clinical Pharmacist 07/01/2016

## 2016-07-01 NOTE — ED Provider Notes (Signed)
[facility] Emergency Department Provider Note  ____________________________________________   I have reviewed the triage vital signs and the nursing notes.   HISTORY  Chief Complaint Weakness and Fall   History limited by: Not Limited   HPI Scott Buckley is a 76 y.o. male who presents to the emergency department today because of concerns for weakness and a fall. The patient states that he felt okay this morning and then around the time started feeling weak. States he felt weak in his bilateral lower extremities. He went to take a shower when he fell. He denies losing consciousness. He has not noticed any chest pain, shortness breath or cough. He has noticed increased frequency of urination without any bad odor or discomfort. No change in defecation. He had felt warm throughout the day.   Past Medical History:  Diagnosis Date  . Aneurysm ([place])   . Cancer ([place])   . Diabetes mellitus without complication ([place])   . Hypertension   . Renal disorder     There are no active problems to display for this patient.   Past Surgical History:  Procedure Laterality Date  . HERNIA REPAIR    . JOINT REPLACEMENT    . TOTAL HIP REVISION      Prior to Admission medications   Not on File    Allergies Patient has no allergy information on record.  No family history on file.  Social History Social History  Substance Use Topics  . Smoking status: Light Tobacco Smoker    Types: Cigars  . Smokeless tobacco: Never Used  . Alcohol use Yes     Comment: "3-4oz of liquor a day'    Review of Systems  Constitutional: Positive for feeling warm. Cardiovascular: Negative for chest pain. Respiratory: Negative for shortness of breath. Gastrointestinal: Negative for abdominal pain, vomiting and diarrhea. Neurological: Positive for lower extremity weakness.   10-point ROS otherwise negative.  ____________________________________________   PHYSICAL  EXAM:  VITAL SIGNS: ED Triage Vitals  Enc Vitals Group     BP 07/01/16 1906 111/75     Pulse Rate 07/01/16 1906 (!) 126     Resp 07/01/16 1906 (!) 21     Temp 07/01/16 1906 (!) 100.5 F (38.1 C)     Temp Source 07/01/16 1906 Oral     SpO2 07/01/16 1906 94 %     Weight 07/01/16 1909 245 lb (111.1 kg)     Height 07/01/16 1909 6\' 4"  (1.93 m)    Constitutional: Alert and oriented. Well appearing and in no distress. Eyes: Conjunctivae are normal. Normal extraocular movements. ENT   Head: Normocephalic and atraumatic.   Nose: No congestion/rhinnorhea.   Mouth/Throat: Mucous membranes are moist.   Neck: No stridor. Hematological/Lymphatic/Immunilogical: No cervical lymphadenopathy. Cardiovascular: Tachycardic, regular rhythm.  No murmurs, rubs, or gallops. Respiratory: Normal respiratory effort without tachypnea nor retractions. Breath sounds are clear and equal bilaterally. No wheezes/rales/rhonchi. Gastrointestinal: Soft and non tender. No rebound. No guarding.  Genitourinary: Deferred Musculoskeletal: Normal range of motion in all extremities. No lower extremity edema. Neurologic:  Normal speech and language. No gross focal neurologic deficits are appreciated.  Skin:  Skin is warm, dry and intact. No rash noted. Psychiatric: Mood and affect are normal. Speech and behavior are normal. Patient exhibits appropriate insight and judgment.  ____________________________________________    LABS (pertinent positives/negatives)  WBC 17.3 Lactic 2.4 Cr 1.55  ____________________________________________   EKG  I, Nance Pear, attending physician, personally viewed and interpreted this EKG  EKG  Time: 1933 Rate: 120 Rhythm: sinus tachycardia Axis: normal Intervals: qtc 430 QRS: narrow ST changes: no st elevation Impression: abnormal ekg   ____________________________________________    RADIOLOGY  CXR IMPRESSION:  No active disease.         ____________________________________________   PROCEDURES  Procedures  CRITICAL CARE Performed by: Nance Pear   Total critical care time: 30 minutes  Critical care time was exclusive of separately billable procedures and treating other patients.  Critical care was necessary to treat or prevent imminent or life-threatening deterioration.  Critical care was time spent personally by me on the following activities: development of treatment plan with patient and/or surrogate as well as nursing, discussions with consultants, evaluation of patient's response to treatment, examination of patient, obtaining history from patient or surrogate, ordering and performing treatments and interventions, ordering and review of laboratory studies, ordering and review of radiographic studies, pulse oximetry and re-evaluation of patient's condition.  ____________________________________________   INITIAL IMPRESSION / ASSESSMENT AND PLAN / ED COURSE  Pertinent labs & imaging results that were available during my care of the patient were reviewed by me and considered in my medical decision making (see chart for details).  Patient presented to the emergency department today because of concerns for weakness and fall. No traumatic injuries from the fall. Patient was tachycardic and febrile upon arrival to the emergency department. Code sepsis was called. Patient's white blood cell and lactic acid levels were elevated. Patient was given multiple broad-spectrum antibiotics. Patient will be admitted to the hospital service.  ____________________________________________   FINAL CLINICAL IMPRESSION(S) / ED DIAGNOSES  Final diagnoses:  Weakness  Sepsis, due to unspecified organism [facility])     Note: This dictation was prepared with Dragon dictation. Any transcriptional errors that result from this process are unintentional     Nance Pear, MD 07/02/16 1429

## 2016-07-02 DIAGNOSIS — R531 Weakness: Secondary | ICD-10-CM | POA: Diagnosis not present

## 2016-07-02 DIAGNOSIS — A419 Sepsis, unspecified organism: Secondary | ICD-10-CM | POA: Diagnosis not present

## 2016-07-02 LAB — CBC
HCT: 38.8 % — ABNORMAL LOW (ref 40.0–52.0)
HEMOGLOBIN: 13.1 g/dL (ref 13.0–18.0)
MCH: 32 pg (ref 26.0–34.0)
MCHC: 33.9 g/dL (ref 32.0–36.0)
MCV: 94.5 fL (ref 80.0–100.0)
Platelets: 166 10*3/uL (ref 150–440)
RBC: 4.11 MIL/uL — ABNORMAL LOW (ref 4.40–5.90)
RDW: 13.8 % (ref 11.5–14.5)
WBC: 16 10*3/uL — ABNORMAL HIGH (ref 3.8–10.6)

## 2016-07-02 LAB — BASIC METABOLIC PANEL
ANION GAP: 7 (ref 5–15)
BUN: 27 mg/dL — AB (ref 6–20)
CALCIUM: 9 mg/dL (ref 8.9–10.3)
CO2: 28 mmol/L (ref 22–32)
Chloride: 105 mmol/L (ref 101–111)
Creatinine, Ser: 1.68 mg/dL — ABNORMAL HIGH (ref 0.61–1.24)
GFR calc Af Amer: 44 mL/min — ABNORMAL LOW (ref >60)
GFR calc non Af Amer: 38 mL/min — ABNORMAL LOW (ref >60)
GLUCOSE: 115 mg/dL — AB (ref 65–99)
Potassium: 3.7 mmol/L (ref 3.5–5.1)
Sodium: 140 mmol/L (ref 135–145)

## 2016-07-02 LAB — GLUCOSE, CAPILLARY
Glucose-Capillary: 111 mg/dL — ABNORMAL HIGH (ref 65–99)
Glucose-Capillary: 117 mg/dL — ABNORMAL HIGH (ref 65–99)
Glucose-Capillary: 133 mg/dL — ABNORMAL HIGH (ref 65–99)
Glucose-Capillary: 146 mg/dL — ABNORMAL HIGH (ref 65–99)
Glucose-Capillary: 165 mg/dL — ABNORMAL HIGH (ref 65–99)

## 2016-07-02 LAB — LACTIC ACID, PLASMA: Lactic Acid, Venous: 1.6 mmol/L (ref 0.5–1.9)

## 2016-07-02 LAB — MRSA PCR SCREENING: MRSA BY PCR: NEGATIVE

## 2016-07-02 NOTE — Progress Notes (Signed)
[facility] [place] NAME: Scott Buckley    MR#:  [id number]  DATE OF BIRTH:  1940-09-17  SUBJECTIVE:   Patient here due to a fever and after a fall. Suspected to have sepsis but no clear source and verified. Wife at bedside, feels better today. Afebrile this a.m.  REVIEW OF SYSTEMS:    Review of Systems  Constitutional: Negative for chills and fever.  HENT: Negative for congestion and tinnitus.   Eyes: Negative for blurred vision and double vision.  Respiratory: Negative for cough, shortness of breath and wheezing.   Cardiovascular: Negative for chest pain, orthopnea and PND.  Gastrointestinal: Negative for abdominal pain, diarrhea, nausea and vomiting.  Genitourinary: Negative for dysuria and hematuria.  Neurological: Positive for weakness. Negative for dizziness, sensory change and focal weakness.  All other systems reviewed and are negative.   Nutrition: Heart Healthy/Carb Control Tolerating Diet: Yes Tolerating PT: Await Eval.    DRUG ALLERGIES:   Allergies  Allergen Reactions  . Sulfa Antibiotics Nausea And Vomiting    VITALS:  Blood pressure 127/69, pulse 82, temperature 99.2 F (37.3 C), temperature source Oral, resp. rate 18, height 6\' 4"  (1.93 m), weight 111.1 kg (245 lb), SpO2 97 %.  PHYSICAL EXAMINATION:   Physical Exam  GENERAL:  76 y.o.-year-old patient lying in the bed in no acute distress.  EYES: Pupils equal, round, reactive to light and accommodation. No scleral icterus. Extraocular muscles intact.  HEENT: Head atraumatic, normocephalic. Oropharynx and nasopharynx clear.  NECK:  Supple, no jugular venous distention. No thyroid enlargement, no tenderness.  LUNGS: Normal breath sounds bilaterally, no wheezing, rales, rhonchi. No use of accessory muscles of respiration.  CARDIOVASCULAR: S1, S2 normal. No murmurs, rubs, or gallops.  ABDOMEN: Soft, nontender, nondistended. Bowel sounds present. No organomegaly or  mass.  EXTREMITIES: No cyanosis, clubbing or edema b/l.    NEUROLOGIC: Cranial nerves II through XII are intact. No focal Motor or sensory deficits b/l.   PSYCHIATRIC: The patient is alert and oriented x 3.  SKIN: No obvious rash, lesion, or ulcer.    LABORATORY PANEL:   CBC  Recent Labs Lab 07/02/16 0133  WBC 16.0*  HGB 13.1  HCT 38.8*  PLT 166   ------------------------------------------------------------------------------------------------------------------  Chemistries   Recent Labs Lab 07/01/16 1938 07/02/16 0133  NA 134* 140  K 4.2 3.7  CL 101 105  CO2 23 28  GLUCOSE 189* 115*  BUN 24* 27*  CREATININE 1.55* 1.68*  CALCIUM 9.3 9.0  AST 38  --   ALT 35  --   ALKPHOS 34*  --   BILITOT 0.7  --    ------------------------------------------------------------------------------------------------------------------  Cardiac Enzymes  Recent Labs Lab 07/01/16 1938  TROPONINI <0.03   ------------------------------------------------------------------------------------------------------------------  RADIOLOGY:  Dg Chest Port 1 View  Result Date: 07/01/2016 CLINICAL DATA:  Weakness and fever.  Status post fall from standing. EXAM: PORTABLE CHEST 1 VIEW COMPARISON:  None. FINDINGS: The heart size and mediastinal contours are within normal limits. Both lungs are clear. The visualized skeletal structures are unremarkable. IMPRESSION: No active disease. Electronically Signed   By: Kathreen Devoid   On: 07/01/2016 19:47     ASSESSMENT AND PLAN:   76 year old male with past medical history of hypertension, diabetes, history of chronic kidney disease stage III who presents to the hospital after fever weakness and a recent fall.  1. Sepsis-this was a suspected diagnosis given patient's fever, elevated lactic acid, tachycardia. -No clear source has been  identified. Patient's urinalysis is negative, chest x-ray is negative for pneumonia. -Patient is empirically on IV  vancomycin, Zosyn. Cultures so far negative. I will get a MRSA PCR negative negative I will DC vancomycin.  2. Weakness status post recent fall-we'll get a physical therapy evaluation to assess mobility.  3. Chronic kidney disease stage III-patient's creatinine is close to baseline.  4. Diabetes type 2 without complication-continue sliding scale insulin, follow blood sugars.  5. Essential hypertension-HCTZ and lisinopril on hold as patient was hypotensive when he presented. Hold antihypertensives for now.   All the records are reviewed and case discussed with Care Management/Social Worker. Management plans discussed with the patient, family and they are in agreement.  CODE STATUS: Full  DVT Prophylaxis: Lovenox  TOTAL TIME TAKING CARE OF THIS PATIENT: 30 minutes.   POSSIBLE D/C IN 1-2 DAYS, DEPENDING ON CLINICAL CONDITION.   Henreitta Leber M.D on 07/02/2016 at 1:31 PM  Between 7am to 6pm - Pager - [phone]  After 6pm go to [email or web address] - [employer] [facility]  Office  [phone]  CC: Primary care physician; No PCP Per Patient

## 2016-07-02 NOTE — Progress Notes (Signed)
Updated attending MD about patient MRSA results and antibiotic regimen, new order received. Pt resting in bed. Continue to assess

## 2016-07-03 DIAGNOSIS — A419 Sepsis, unspecified organism: Secondary | ICD-10-CM | POA: Diagnosis not present

## 2016-07-03 DIAGNOSIS — R531 Weakness: Secondary | ICD-10-CM | POA: Diagnosis not present

## 2016-07-03 LAB — CBC
HCT: 38.6 % — ABNORMAL LOW (ref 40.0–52.0)
Hemoglobin: 13.2 g/dL (ref 13.0–18.0)
MCH: 32.4 pg (ref 26.0–34.0)
MCHC: 34.2 g/dL (ref 32.0–36.0)
MCV: 94.7 fL (ref 80.0–100.0)
Platelets: 178 10*3/uL (ref 150–440)
RBC: 4.08 MIL/uL — AB (ref 4.40–5.90)
RDW: 13.8 % (ref 11.5–14.5)
WBC: 9.3 10*3/uL (ref 3.8–10.6)

## 2016-07-03 LAB — URINE CULTURE

## 2016-07-03 LAB — GLUCOSE, CAPILLARY: Glucose-Capillary: 125 mg/dL — ABNORMAL HIGH (ref 65–99)

## 2016-07-03 LAB — HEMOGLOBIN A1C
Hgb A1c MFr Bld: 6.6 % — ABNORMAL HIGH (ref 4.8–5.6)
MEAN PLASMA GLUCOSE: 143 mg/dL

## 2016-07-03 MED ORDER — AMOXICILLIN-POT CLAVULANATE 875-125 MG PO TABS: 1.0000 | ORAL_TABLET | Freq: Two times a day (BID) | ORAL | 0 refills | Status: AC

## 2016-07-03 NOTE — Progress Notes (Signed)
Patient is alert and oriented and able to verbalize needs. Wife at bedside. No pain at this time. VS stable. All discharge instructions gone over with patient and wife at this time. Hard scripts and printed AVS sent with patient. PIV discontinued. Patient and wife verbalized understanding of all discharge instructions. Patients wife will transport patient home.

## 2016-07-03 NOTE — Evaluation (Signed)
Physical Therapy Evaluation Patient Details Name: Scott Buckley MRN: [id number] DOB: [birth year] Today's Date: 07/03/2016   History of Present Illness  Pt admitted for sepsis due to UTI. Pt with complaints of falls and weakness. Pt with history of cancer, DM, and HTN.  Clinical Impression  Pt is a pleasant 76 year old male who was admitted for sepsis due to UTI. Pt demonstrates all bed mobility/transfers/ambulation at baseline level. Pt also able to perform stair training with safe technique with no cues required for safety. Pt does not require any further PT needs at this time. Pt will be dc in house and does not require follow up. RN aware. Will dc current orders.      Follow Up Recommendations No PT follow up    Equipment Recommendations  None recommended by PT    Recommendations for Other Services       Precautions / Restrictions Precautions Precautions: Fall Restrictions Weight Bearing Restrictions: No      Mobility  Bed Mobility               General bed mobility comments: not performed, as pt received seated at EOB.  Transfers Overall transfer level: Independent Equipment used: None             General transfer comment: safe technique performed without AD required  Ambulation/Gait Ambulation/Gait assistance: Supervision Ambulation Distance (Feet): 200 Feet Assistive device: None Gait Pattern/deviations: WFL(Within Functional Limits)     General Gait Details: safe technique performed with upright posture and good speed. No LOB deviations noted  Stairs Stairs: Yes Stairs assistance: Supervision Stair Management: Two rails Number of Stairs: 4 General stair comments: Safe technique with upright posture and step over technique. Rails used with no LOB noted  Wheelchair Mobility    Modified Rankin (Stroke Patients Only)       Balance Overall balance assessment: History of Falls                                            Pertinent Vitals/Pain Pain Assessment: No/denies pain    Home Living Family/patient expects to be discharged to:: Private residence Living Arrangements: Spouse/significant other Available Help at Discharge: Family Type of Home: House Home Access: Stairs to enter Entrance Stairs-Rails: None Technical brewer of Steps: 1 Home Layout: One level Home Equipment: None      Prior Function Level of Independence: Independent               Hand Dominance        Extremity/Trunk Assessment   Upper Extremity Assessment Upper Extremity Assessment: Overall WFL for tasks assessed    Lower Extremity Assessment Lower Extremity Assessment: Overall WFL for tasks assessed       Communication   Communication: No difficulties  Cognition Arousal/Alertness: Awake/alert Behavior During Therapy: WFL for tasks assessed/performed Overall Cognitive Status: Within Functional Limits for tasks assessed                                        General Comments      Exercises     Assessment/Plan    PT Assessment Patent does not need any further PT services  PT Problem List         PT Treatment Interventions      PT  Goals (Current goals can be found in the Care Plan section)  Acute Rehab PT Goals Patient Stated Goal: to go home PT Goal Formulation: All assessment and education complete, DC therapy Time For Goal Achievement: 07/03/16 Potential to Achieve Goals: Good    Frequency     Barriers to discharge        Co-evaluation               End of Session Equipment Utilized During Treatment: Gait belt Activity Tolerance: Patient tolerated treatment well Patient left: in bed Nurse Communication: Mobility status PT Visit Diagnosis: Unsteadiness on feet (R26.81);Difficulty in walking, not elsewhere classified (R26.2)    Time: 8242-3536 PT Time Calculation (min) (ACUTE ONLY): 9 min   Charges:   PT Evaluation $PT Eval Low Complexity: 1  Procedure PT Treatments $Gait Training: 8-22 mins   PT G Codes:        Scott Buckley, PT, DPT [phone]   Scott Buckley 07/03/2016, 2:49 PM

## 2016-07-03 NOTE — Discharge Summary (Signed)
[facility] [place] NAME: Scott Buckley    MR#:  [id number]  DATE OF BIRTH:  [birth year]  DATE OF ADMISSION:  07/01/2016 ADMITTING PHYSICIAN: Lance Coon, MD  DATE OF DISCHARGE: 07/03/2016 10:55 AM  PRIMARY CARE PHYSICIAN: Leonel Ramsay, MD    ADMISSION DIAGNOSIS:  Weakness [R53.1] Sepsis, due to unspecified organism ([place]) [A41.9]  DISCHARGE DIAGNOSIS:  Principal Problem:   Sepsis ([place]) Active Problems:   UTI (urinary tract infection)   HTN (hypertension)   Diabetes ([place])   CKD (chronic kidney disease), stage III   SECONDARY DIAGNOSIS:   Past Medical History:  Diagnosis Date  . Aneurysm ([place])   . Cancer ([place])   . Diabetes mellitus without complication ([place])   . Hypertension   . Renal disorder     HOSPITAL COURSE:   76 year old male with past medical history of hypertension, diabetes, history of chronic kidney disease stage III who presents to the hospital after fever weakness and a recent fall.  1. Sepsis-this was the suspected diagnosis given patient's fever, elevated lactic acid, tachycardia. -Patient was admitted to the hospital and empirically treated with IV vancomycin, Zosyn. Patient's urinalysis was negative, his chest x-ray was negative for any acute pneumonia. He was observed overnight had no further fever spikes, his cultures have remained negative. -Sepsis has not been ruled out but patient is empirically being discharged on oral Augmentin for a few days.  2. Weakness status post recent fall-physical therapy consult was obtained to assess his mobility and he did not require any services and therefore was discharged home.  3. Chronic kidney disease stage III-patient's creatinine is close to baseline and can be further followed as an outpatient.  4. 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  5. Essential hypertension-patient will resume his HCTZ lisinopril upon  discharge.  DISCHARGE CONDITIONS:   Stable  CONSULTS OBTAINED:    DRUG ALLERGIES:   Allergies  Allergen Reactions  . Sulfa Antibiotics Nausea And Vomiting    DISCHARGE MEDICATIONS:   Allergies as of 07/03/2016      Reactions   Sulfa Antibiotics Nausea And Vomiting      Medication List    TAKE these medications   amoxicillin-clavulanate 875-125 MG tablet Commonly known as:  AUGMENTIN Take 1 tablet by mouth 2 (two) times daily.   aspirin EC 81 MG tablet Take 81 mg by mouth daily.   glimepiride 2 MG tablet Commonly known as:  AMARYL Take 2 mg by mouth daily with breakfast.   hydrochlorothiazide 25 MG tablet Commonly known as:  HYDRODIURIL Take 25 mg by mouth daily.   lisinopril 20 MG tablet Commonly known as:  PRINIVIL,ZESTRIL Take 20 mg by mouth daily.   metFORMIN 500 MG tablet Commonly known as:  GLUCOPHAGE Take 500 mg by mouth 2 (two) times daily with a meal.         DISCHARGE INSTRUCTIONS:   DIET:  Cardiac diet and Diabetic diet  DISCHARGE CONDITION:  Stable  ACTIVITY:  Activity as tolerated  OXYGEN:  Home Oxygen: No.   Oxygen Delivery: room air  DISCHARGE LOCATION:  home   If you experience worsening of your admission symptoms, develop shortness of breath, life threatening emergency, suicidal or homicidal thoughts you must seek medical attention immediately by calling 911 or calling your MD immediately  if symptoms less severe.  You Must read complete instructions/literature along with all the possible adverse reactions/side effects for all the Medicines you take  and that have been prescribed to you. Take any new Medicines after you have completely understood and accpet all the possible adverse reactions/side effects.   Please note  You were cared for by a hospitalist during your hospital stay. If you have any questions about your discharge medications or the care you received while you were in the hospital after you are discharged, you  can call the unit and asked to speak with the hospitalist on call if the hospitalist that took care of you is not available. Once you are discharged, your primary care physician will handle any further medical issues. Please note that NO REFILLS for any discharge medications will be authorized once you are discharged, as it is imperative that you return to your primary care physician (or establish a relationship with a primary care physician if you do not have one) for your aftercare needs so that they can reassess your need for medications and monitor your lab values.     Today   No fever overnight. Feels better wants to go home.    VITAL SIGNS:  Blood pressure 137/78, pulse 81, temperature 98.3 F (36.8 C), temperature source Oral, resp. rate 16, height 6\' 4"  (1.93 m), weight 111.1 kg (245 lb), SpO2 96 %.  I/O:   Intake/Output Summary (Last 24 hours) at 07/03/16 1705 Last data filed at 07/03/16 0800  Gross per 24 hour  Intake              530 ml  Output                0 ml  Net              530 ml    PHYSICAL EXAMINATION:  GENERAL:  76 y.o.-year-old patient lying in the bed with no acute distress.  EYES: Pupils equal, round, reactive to light and accommodation. No scleral icterus. Extraocular muscles intact.  HEENT: Head atraumatic, normocephalic. Oropharynx and nasopharynx clear.  NECK:  Supple, no jugular venous distention. No thyroid enlargement, no tenderness.  LUNGS: Normal breath sounds bilaterally, no wheezing, rales,rhonchi. No use of accessory muscles of respiration.  CARDIOVASCULAR: S1, S2 normal. No murmurs, rubs, or gallops.  ABDOMEN: Soft, non-tender, non-distended. Bowel sounds present. No organomegaly or mass.  EXTREMITIES: No pedal edema, cyanosis, or clubbing.  NEUROLOGIC: Cranial nerves II through XII are intact. No focal motor or sensory defecits b/l.  PSYCHIATRIC: The patient is alert and oriented x 3.  SKIN: No obvious rash, lesion, or ulcer.   DATA REVIEW:    CBC  Recent Labs Lab 07/03/16 1023  WBC 9.3  HGB 13.2  HCT 38.6*  PLT 178    Chemistries   Recent Labs Lab 07/01/16 1938 07/02/16 0133  NA 134* 140  K 4.2 3.7  CL 101 105  CO2 23 28  GLUCOSE 189* 115*  BUN 24* 27*  CREATININE 1.55* 1.68*  CALCIUM 9.3 9.0  AST 38  --   ALT 35  --   ALKPHOS 34*  --   BILITOT 0.7  --     Cardiac Enzymes  Recent Labs Lab 07/01/16 1938  TROPONINI <0.03    Microbiology Results  Results for orders placed or performed during the hospital encounter of 07/01/16  Blood Culture (routine x 2)     Status: None (Preliminary result)   Collection Time: 07/01/16  7:38 PM  Result Value Ref Range Status   Specimen Description BLOOD RIGHT FOREARM  Final   Special Requests   Final  BOTTLES DRAWN AEROBIC AND ANAEROBIC Blood Culture results may not be optimal due to an excessive volume of blood received in culture bottles   Culture NO GROWTH 2 DAYS  Final   Report Status PENDING  Incomplete  Blood Culture (routine x 2)     Status: None (Preliminary result)   Collection Time: 07/01/16  7:39 PM  Result Value Ref Range Status   Specimen Description BLOOD LEFT FOREARM  Final   Special Requests   Final    BOTTLES DRAWN AEROBIC AND ANAEROBIC Blood Culture results may not be optimal due to an excessive volume of blood received in culture bottles   Culture NO GROWTH 2 DAYS  Final   Report Status PENDING  Incomplete  Urine culture     Status: Abnormal   Collection Time: 07/01/16  8:55 PM  Result Value Ref Range Status   Specimen Description URINE, RANDOM  Final   Special Requests NONE  Final   Culture MULTIPLE SPECIES PRESENT, SUGGEST RECOLLECTION (A)  Final   Report Status 07/03/2016 FINAL  Final  MRSA PCR Screening     Status: None   Collection Time: 07/02/16  1:17 PM  Result Value Ref Range Status   MRSA by PCR NEGATIVE NEGATIVE Final    Comment:        The GeneXpert MRSA Assay (FDA approved for NASAL specimens only), is one component  of a comprehensive MRSA colonization surveillance program. It is not intended to diagnose MRSA infection nor to guide or monitor treatment for MRSA infections.     RADIOLOGY:  Dg Chest Port 1 View  Result Date: 07/01/2016 CLINICAL DATA:  Weakness and fever.  Status post fall from standing. EXAM: PORTABLE CHEST 1 VIEW COMPARISON:  None. FINDINGS: The heart size and mediastinal contours are within normal limits. Both lungs are clear. The visualized skeletal structures are unremarkable. IMPRESSION: No active disease. Electronically Signed   By: Kathreen Devoid   On: 07/01/2016 19:47      Management plans discussed with the patient, family and they are in agreement.  CODE STATUS:  Code Status History    Date Active Date Inactive Code Status Order ID Comments User Context   07/01/2016 11:24 PM 07/03/2016  1:55 PM Full Code [id number]  Lance Coon, MD ED    Advance Directive Documentation     Most Recent Value  Type of Advance Directive  Living will  Pre-existing out of facility DNR order (yellow form or pink MOST form)  -  "MOST" Form in Place?  -      TOTAL TIME TAKING CARE OF THIS PATIENT: 40 minutes.    Henreitta Leber M.D on 07/03/2016 at 5:05 PM  Between 7am to 6pm - Pager - [phone]  After 6pm go to [email or web address] - [employer] [facility]  Office  [phone]  CC: Primary care physician; Leonel Ramsay, MD

## 2016-07-06 LAB — CULTURE, BLOOD (ROUTINE X 2)
Culture: NO GROWTH
Culture: NO GROWTH

## 2016-07-12 DIAGNOSIS — I872 Venous insufficiency (chronic) (peripheral): Secondary | ICD-10-CM | POA: Insufficient documentation

## 2016-09-06 ENCOUNTER — Other Ambulatory Visit (INDEPENDENT_AMBULATORY_CARE_PROVIDER_SITE_OTHER): Payer: Medicare HMO

## 2016-09-06 ENCOUNTER — Encounter (INDEPENDENT_AMBULATORY_CARE_PROVIDER_SITE_OTHER): Payer: Self-pay | Admitting: Vascular Surgery

## 2016-09-06 ENCOUNTER — Other Ambulatory Visit (INDEPENDENT_AMBULATORY_CARE_PROVIDER_SITE_OTHER): Payer: Self-pay | Admitting: Vascular Surgery

## 2016-09-06 ENCOUNTER — Ambulatory Visit (INDEPENDENT_AMBULATORY_CARE_PROVIDER_SITE_OTHER): Payer: Medicare HMO | Admitting: Vascular Surgery

## 2016-09-06 VITALS — BP 122/82 | HR 70 | Resp 16 | Wt 252.0 lb

## 2016-09-06 DIAGNOSIS — E118 Type 2 diabetes mellitus with unspecified complications: Secondary | ICD-10-CM

## 2016-09-06 DIAGNOSIS — I723 Aneurysm of iliac artery: Secondary | ICD-10-CM

## 2016-09-06 DIAGNOSIS — I10 Essential (primary) hypertension: Secondary | ICD-10-CM

## 2016-09-06 DIAGNOSIS — N183 Chronic kidney disease, stage 3 unspecified: Secondary | ICD-10-CM

## 2016-09-06 NOTE — Assessment & Plan Note (Signed)
blood glucose control important in reducing the progression of atherosclerotic disease. Also, involved in wound healing. On appropriate medications.  

## 2016-09-06 NOTE — Assessment & Plan Note (Signed)
blood pressure control important in reducing the progression of atherosclerotic disease. On appropriate oral medications.  

## 2016-09-06 NOTE — Assessment & Plan Note (Signed)
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 From an aneurysm standpoint, he is doing well. His duplex today demonstrates a patent stent graft without evidence of endoleak and mild decrease in size of his left common iliac artery aneurysm now measuring 3.1 cm in maximal diameter. The right iliac artery measures 1.9 cm in maximal diameter. The aorta measures 2.7 cm in maximal diameter. We will continue to follow this on an annual basis and he will contact our office with problems in the interim.

## 2016-09-06 NOTE — Progress Notes (Signed)
MRN : [id number]  Scott Buckley is a 76 y.o. (07/09/1940) male who presents with chief complaint of  Chief Complaint  Patient presents with  . Follow-up  .  History of Present Illness: Patient returns today in follow up of Iliac artery aneurysm. 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 He had a pretty severe sounding UTI earlier this year which required hospitalization and extended antibiotics. He is currently doing reasonably well although he still has low back pain and fatigues easily. His duplex today demonstrates a patent stent graft without evidence of endoleak and mild decrease in size of his left common iliac artery aneurysm now measuring 3.1 cm in maximal diameter. The right iliac artery measures 1.9 cm in maximal diameter. The aorta measures 2.7 cm in maximal diameter.  Current Outpatient Prescriptions  Medication Sig Dispense Refill  . Acetaminophen (TYLENOL 8 HOUR PO) Take by mouth as needed.    Marland Kitchen aspirin EC 81 MG tablet Take 81 mg by mouth daily.    Marland Kitchen glimepiride (AMARYL) 2 MG tablet Take 2 mg by mouth daily with breakfast.    . hydrochlorothiazide (HYDRODIURIL) 25 MG tablet Take 25 mg by mouth daily.    Marland Kitchen lisinopril (PRINIVIL,ZESTRIL) 20 MG tablet Take 20 mg by mouth daily.    . metFORMIN (GLUCOPHAGE) 500 MG tablet Take 500 mg by mouth 2 (two) times daily with a meal.     No current facility-administered medications for this visit.     Past Medical History:  Diagnosis Date  . Aneurysm ([place])   . Cancer ([place])   . Diabetes mellitus without complication ([place])   . Hypertension   . Renal disorder     Past Surgical History:  Procedure Laterality Date  . HERNIA REPAIR    . JOINT REPLACEMENT    . TOTAL HIP REVISION      Social History Social History  Substance Use Topics  . Smoking status: Light Tobacco Smoker    Types: Cigars  . Smokeless tobacco: Never Used  . Alcohol use Yes     Comment: "3-4oz of liquor a day'      Family History No bleeding or clotting disorders  Allergies  Allergen Reactions  . Sulfa Antibiotics Nausea And Vomiting     REVIEW OF SYSTEMS (Negative unless checked)  Constitutional: '[]' Weight loss  '[]' Fever  '[]' Chills Cardiac: '[]' Chest pain   '[]' Chest pressure   '[]' Palpitations   '[]' Shortness of breath when laying flat   '[]' Shortness of breath at rest   '[]' Shortness of breath with exertion. Vascular:  '[]' Pain in legs with walking   '[]' Pain in legs at rest   '[]' Pain in legs when laying flat   '[]' Claudication   '[]' Pain in feet when walking  '[]' Pain in feet at rest  '[]' Pain in feet when laying flat   '[]' History of DVT   '[]' Phlebitis   '[]' Swelling in legs   '[]' Varicose veins   '[]' Non-healing ulcers Pulmonary:   '[]' Uses home oxygen   '[]' Productive cough   '[]' Hemoptysis   '[]' Wheeze  '[]' COPD   '[]' Asthma Neurologic:  '[]' Dizziness  '[]' Blackouts   '[]' Seizures   '[]' History of stroke   '[]' History of TIA  '[]' Aphasia   '[]' Temporary blindness   '[]' Dysphagia   '[]' Weakness or numbness in arms   '[]' Weakness or numbness in legs Musculoskeletal:  '[x]' Arthritis   '[]' Joint swelling   '[]' Joint pain   '[x]' Low back pain Hematologic:  '[]' Easy bruising  '[]' Easy bleeding   '[]' Hypercoagulable state   '[]'   Anemic   Gastrointestinal:  '[]' Blood in stool   '[]' Vomiting blood  '[]' Gastroesophageal reflux/heartburn   '[]' Abdominal pain Genitourinary:  '[]' Chronic kidney disease   '[]' Difficult urination  '[x]' Frequent urination  '[]' Burning with urination   '[]' Hematuria Skin:  '[]' Rashes   '[]' Ulcers   '[]' Wounds Psychological:  '[]' History of anxiety   '[]'  History of major depression.  Physical Examination  BP 122/82   Pulse 70   Resp 16   Wt 114.3 kg (252 lb)   BMI 30.67 kg/m  Gen:  WD/WN, NAD Head: [place]/AT, No temporalis wasting. Ear/Nose/Throat: Hearing grossly intact, nares w/o erythema or drainage, trachea midline Eyes: Conjunctiva clear. Sclera non-icteric Neck: Supple.  No JVD.  Pulmonary:  Good air movement, no use of accessory muscles.  Cardiac: RRR, normal S1,  S2 Vascular:  Vessel Right Left  Radial Palpable Palpable                                   Gastrointestinal: soft, non-tender/non-distended.  Musculoskeletal: M/S 5/5 throughout.  No deformity or atrophy. Neurologic: Sensation grossly intact in extremities.  Symmetrical.  Speech is fluent.  Psychiatric: Judgment intact, Mood & affect appropriate for pt's clinical situation. Dermatologic: No rashes or ulcers noted.  No cellulitis or open wounds.       Labs Recent Results (from the past 2160 hour(s))  CBC     Status: Abnormal   Collection Time: 07/01/16  7:38 PM  Result Value Ref Range   WBC 17.3 (H) 3.8 - 10.6 K/uL   RBC 4.45 4.40 - 5.90 MIL/uL   Hemoglobin 14.1 13.0 - 18.0 g/dL   HCT 41.6 40.0 - 52.0 %   MCV 93.6 80.0 - 100.0 fL   MCH 31.7 26.0 - 34.0 pg   MCHC 33.9 32.0 - 36.0 g/dL   RDW 13.4 11.5 - 14.5 %   Platelets 175 150 - 440 K/uL  Lactic acid, plasma     Status: Abnormal   Collection Time: 07/01/16  7:38 PM  Result Value Ref Range   Lactic Acid, Venous 2.4 (HH) 0.5 - 1.9 mmol/L    Comment: CRITICAL RESULT CALLED TO, READ BACK BY AND VERIFIED WITH CASEY ROBERTS AT 2048 07/01/2016 BY TFK.   Comprehensive metabolic panel     Status: Abnormal   Collection Time: 07/01/16  7:38 PM  Result Value Ref Range   Sodium 134 (L) 135 - 145 mmol/L   Potassium 4.2 3.5 - 5.1 mmol/L   Chloride 101 101 - 111 mmol/L   CO2 23 22 - 32 mmol/L   Glucose, Bld 189 (H) 65 - 99 mg/dL   BUN 24 (H) 6 - 20 mg/dL   Creatinine, Ser 1.55 (H) 0.61 - 1.24 mg/dL   Calcium 9.3 8.9 - 10.3 mg/dL   Total Protein 7.5 6.5 - 8.1 g/dL   Albumin 3.8 3.5 - 5.0 g/dL   AST 38 15 - 41 U/L   ALT 35 17 - 63 U/L   Alkaline Phosphatase 34 (L) 38 - 126 U/L   Total Bilirubin 0.7 0.3 - 1.2 mg/dL   GFR calc non Af Amer 41 (L) >60 mL/min   GFR calc Af Amer 48 (L) >60 mL/min    Comment: (NOTE) The eGFR has been calculated using the CKD EPI equation. This calculation has not been validated in all  clinical situations. eGFR's persistently <60 mL/min signify possible Chronic Kidney Disease.    Anion gap 10 5 - 15  Troponin I     Status: None   Collection Time: 07/01/16  7:38 PM  Result Value Ref Range   Troponin I <0.03 <0.03 ng/mL  Blood Culture (routine x 2)     Status: None   Collection Time: 07/01/16  7:38 PM  Result Value Ref Range   Specimen Description BLOOD RIGHT FOREARM    Special Requests      BOTTLES DRAWN AEROBIC AND ANAEROBIC Blood Culture results may not be optimal due to an excessive volume of blood received in culture bottles   Culture NO GROWTH 5 DAYS    Report Status 07/06/2016 FINAL   Blood Culture (routine x 2)     Status: None   Collection Time: 07/01/16  7:39 PM  Result Value Ref Range   Specimen Description BLOOD LEFT FOREARM    Special Requests      BOTTLES DRAWN AEROBIC AND ANAEROBIC Blood Culture results may not be optimal due to an excessive volume of blood received in culture bottles   Culture NO GROWTH 5 DAYS    Report Status 07/06/2016 FINAL   Urinalysis, Complete w Microscopic     Status: Abnormal   Collection Time: 07/01/16  8:55 PM  Result Value Ref Range   Color, Urine YELLOW (A) YELLOW   APPearance CLEAR (A) CLEAR   Specific Gravity, Urine 1.017 1.005 - 1.030   pH 5.0 5.0 - 8.0   Glucose, UA NEGATIVE NEGATIVE mg/dL   Hgb urine dipstick NEGATIVE NEGATIVE   Bilirubin Urine NEGATIVE NEGATIVE   Ketones, ur NEGATIVE NEGATIVE mg/dL   Protein, ur NEGATIVE NEGATIVE mg/dL   Nitrite NEGATIVE NEGATIVE   Leukocytes, UA NEGATIVE NEGATIVE   RBC / HPF 0-5 0 - 5 RBC/hpf   WBC, UA 0-5 0 - 5 WBC/hpf   Bacteria, UA RARE (A) NONE SEEN   Squamous Epithelial / LPF 0-5 (A) NONE SEEN   Mucous PRESENT   Urine culture     Status: Abnormal   Collection Time: 07/01/16  8:55 PM  Result Value Ref Range   Specimen Description URINE, RANDOM    Special Requests NONE    Culture MULTIPLE SPECIES PRESENT, SUGGEST RECOLLECTION (A)    Report Status 07/03/2016 FINAL    Glucose, capillary     Status: Abnormal   Collection Time: 07/01/16  9:28 PM  Result Value Ref Range   Glucose-Capillary 162 (H) 65 - 99 mg/dL  Lactic acid, plasma     Status: Abnormal   Collection Time: 07/01/16 10:02 PM  Result Value Ref Range   Lactic Acid, Venous 2.1 (HH) 0.5 - 1.9 mmol/L    Comment: CRITICAL RESULT CALLED TO, READ BACK BY AND VERIFIED WITH CASEY ROBERT ON 07/01/16 AT 2310 BY TLB   Glucose, capillary     Status: Abnormal   Collection Time: 07/02/16 12:16 AM  Result Value Ref Range   Glucose-Capillary 146 (H) 65 - 99 mg/dL   Comment 1 Notify RN   Basic metabolic panel     Status: Abnormal   Collection Time: 07/02/16  1:33 AM  Result Value Ref Range   Sodium 140 135 - 145 mmol/L   Potassium 3.7 3.5 - 5.1 mmol/L   Chloride 105 101 - 111 mmol/L   CO2 28 22 - 32 mmol/L   Glucose, Bld 115 (H) 65 - 99 mg/dL   BUN 27 (H) 6 - 20 mg/dL   Creatinine, Ser 1.68 (H) 0.61 - 1.24 mg/dL   Calcium 9.0 8.9 - 10.3 mg/dL   GFR  calc non Af Amer 38 (L) >60 mL/min   GFR calc Af Amer 44 (L) >60 mL/min    Comment: (NOTE) The eGFR has been calculated using the CKD EPI equation. This calculation has not been validated in all clinical situations. eGFR's persistently <60 mL/min signify possible Chronic Kidney Disease.    Anion gap 7 5 - 15  CBC     Status: Abnormal   Collection Time: 07/02/16  1:33 AM  Result Value Ref Range   WBC 16.0 (H) 3.8 - 10.6 K/uL   RBC 4.11 (L) 4.40 - 5.90 MIL/uL   Hemoglobin 13.1 13.0 - 18.0 g/dL   HCT 38.8 (L) 40.0 - 52.0 %   MCV 94.5 80.0 - 100.0 fL   MCH 32.0 26.0 - 34.0 pg   MCHC 33.9 32.0 - 36.0 g/dL   RDW 13.8 11.5 - 14.5 %   Platelets 166 150 - 440 K/uL  Hemoglobin A1c     Status: Abnormal   Collection Time: 07/02/16  1:33 AM  Result Value Ref Range   Hgb A1c MFr Bld 6.6 (H) 4.8 - 5.6 %    Comment: (NOTE)         Pre-diabetes: 5.7 - 6.4         Diabetes: >6.4         Glycemic control for adults with diabetes: <7.0    Mean Plasma  Glucose 143 mg/dL    Comment: (NOTE) Performed At: [facility] [address] [place], [place] [id number] Lindon Romp MD WF:[id number]   Lactic acid, plasma     Status: None   Collection Time: 07/02/16  1:33 AM  Result Value Ref Range   Lactic Acid, Venous 1.6 0.5 - 1.9 mmol/L  Glucose, capillary     Status: Abnormal   Collection Time: 07/02/16  7:29 AM  Result Value Ref Range   Glucose-Capillary 133 (H) 65 - 99 mg/dL  Glucose, capillary     Status: Abnormal   Collection Time: 07/02/16 11:53 AM  Result Value Ref Range   Glucose-Capillary 165 (H) 65 - 99 mg/dL  MRSA PCR Screening     Status: None   Collection Time: 07/02/16  1:17 PM  Result Value Ref Range   MRSA by PCR NEGATIVE NEGATIVE    Comment:        The GeneXpert MRSA Assay (FDA approved for NASAL specimens only), is one component of a comprehensive MRSA colonization surveillance program. It is not intended to diagnose MRSA infection nor to guide or monitor treatment for MRSA infections.   Glucose, capillary     Status: Abnormal   Collection Time: 07/02/16  4:38 PM  Result Value Ref Range   Glucose-Capillary 111 (H) 65 - 99 mg/dL  Glucose, capillary     Status: Abnormal   Collection Time: 07/02/16  9:29 PM  Result Value Ref Range   Glucose-Capillary 117 (H) 65 - 99 mg/dL  Glucose, capillary     Status: Abnormal   Collection Time: 07/03/16  7:32 AM  Result Value Ref Range   Glucose-Capillary 125 (H) 65 - 99 mg/dL   Comment 1 Notify RN    Comment 2 Document in Chart   CBC     Status: Abnormal   Collection Time: 07/03/16 10:23 AM  Result Value Ref Range   WBC 9.3 3.8 - 10.6 K/uL   RBC 4.08 (L) 4.40 - 5.90 MIL/uL   Hemoglobin 13.2 13.0 - 18.0 g/dL   HCT 38.6 (L) 40.0 - 52.0 %  MCV 94.7 80.0 - 100.0 fL   MCH 32.4 26.0 - 34.0 pg   MCHC 34.2 32.0 - 36.0 g/dL   RDW 13.8 11.5 - 14.5 %   Platelets 178 150 - 440 K/uL    Radiology No results found.    Assessment/Plan  HTN  (hypertension) blood pressure control important in reducing the progression of atherosclerotic disease. On appropriate oral medications.   Diabetes ([place]) blood glucose control important in reducing the progression of atherosclerotic disease. Also, involved in wound healing. On appropriate medications.   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 From an aneurysm standpoint, he is doing well. His duplex today demonstrates a patent stent graft without evidence of endoleak and mild decrease in size of his left common iliac artery aneurysm now measuring 3.1 cm in maximal diameter. The right iliac artery measures 1.9 cm in maximal diameter. The aorta measures 2.7 cm in maximal diameter. We will continue to follow this on an annual basis and he will contact our office with problems in the interim.    Leotis Pain, MD  09/06/2016 9:58 AM    This note was created with Dragon medical transcription system.  Any errors from dictation are purely unintentional

## 2017-02-07 NOTE — Progress Notes (Signed)
07/03/16 1439  PT Visit Information  Last PT Received On 07/03/16  Assistance Needed +1  History of Present Illness Pt admitted for sepsis due to UTI. Pt with complaints of falls and weakness. Pt with history of cancer, DM, and HTN.  Precautions  Precautions Fall  Restrictions  Weight Bearing Restrictions No  Home Living  Family/patient expects to be discharged to: Private residence  Living Arrangements Spouse/significant other  Available Help at Discharge Family  Type of [place] to enter  Entrance Stairs-Number of Steps 1  Entrance Stairs-Rails None  Home Layout One level  Home Equipment None  Prior Function  Level of Independence Independent  Communication  Communication No difficulties  Pain Assessment  Pain Assessment No/denies pain  Cognition  Arousal/Alertness Awake/alert  Behavior During Therapy WFL for tasks assessed/performed  Overall Cognitive Status Within Functional Limits for tasks assessed  Upper Extremity Assessment  Upper Extremity Assessment Overall WFL for tasks assessed  Lower Extremity Assessment  Lower Extremity Assessment Overall WFL for tasks assessed  Bed Mobility  General bed mobility comments not performed, as pt received seated at EOB.  Transfers  Overall transfer level Independent  Equipment used None  General transfer comment safe technique performed without AD required  Ambulation/Gait  Ambulation/Gait assistance Supervision  Ambulation Distance (Feet) 200 Feet  Assistive device None  Gait Pattern/deviations WFL(Within Functional Limits)  General Gait Details safe technique performed with upright posture and good speed. No LOB deviations noted  Stairs Yes  Stairs assistance Supervision  Stair Management Two rails  Number of Stairs 4  General stair comments Safe technique with upright posture and step over technique. Rails used with no LOB noted  Balance  Overall balance assessment History of Falls  PT - End of  Session  Equipment Utilized During Treatment Gait belt  Activity Tolerance Patient tolerated treatment well  Patient left in bed  Nurse Communication Mobility status  PT Assessment  PT Recommendation/Assessment Patent does not need any further PT services  PT Visit Diagnosis Unsteadiness on feet (R26.81);Difficulty in walking, not elsewhere classified (R26.2)  No Skilled PT Patient is independent with all acitivity/mobility  AM-PAC PT "6 Clicks" Daily Activity Outcome Measure  Difficulty turning over in bed (including adjusting bedclothes, sheets and blankets)? 4  Difficulty moving from lying on back to sitting on the side of the bed?  4  Difficulty sitting down on and standing up from a chair with arms (e.g., wheelchair, bedside commode, etc,.)? 4  Help needed moving to and from a bed to chair (including a wheelchair)? 4  Help needed walking in hospital room? 4  Help needed climbing 3-5 steps with a railing?  4  6 Click Score 24  Mobility G Code  CH  PT Recommendation  Follow Up Recommendations No PT follow up  PT equipment None recommended by PT  Acute Rehab PT Goals  Patient Stated Goal to go home  PT Goal Formulation All assessment and education complete, DC therapy  Time For Goal Achievement 07/03/16  Potential to Achieve Goals Good  PT Time Calculation  PT Start Time (ACUTE ONLY) 1007  PT Stop Time (ACUTE ONLY) 1016  PT Time Calculation (min) (ACUTE ONLY) 9 min  PT G-Codes **NOT FOR INPATIENT CLASS**  Functional Assessment Tool Used Clinical judgement  Functional Limitation Mobility: Walking and moving around  Mobility: Walking and Moving Around Current Status (P3790) CH  Mobility: Walking and Moving Around Goal Status (W4097) CH  Mobility: Walking and Moving Around  Discharge Status [phone]) [place]  PT General Charges  $$ ACUTE PT VISIT 1 Procedure  PT Evaluation  $PT Eval Low Complexity 1 Procedure  PT Treatments  $Gait Training 8-22 mins   Late G code entry [name], [place], [place]

## 2017-07-01 ENCOUNTER — Emergency Department: Payer: Medicare HMO

## 2017-07-01 ENCOUNTER — Encounter: Payer: Self-pay | Admitting: Emergency Medicine

## 2017-07-01 ENCOUNTER — Emergency Department
Admission: EM | Admit: 2017-07-01 | Discharge: 2017-07-02 | Disposition: A | Discharge status: Home / Self Care | Payer: Medicare HMO | Attending: Emergency Medicine | Admitting: Emergency Medicine

## 2017-07-01 ENCOUNTER — Other Ambulatory Visit: Payer: Self-pay

## 2017-07-01 DIAGNOSIS — N183 Chronic kidney disease, stage 3 (moderate): Secondary | ICD-10-CM | POA: Insufficient documentation

## 2017-07-01 DIAGNOSIS — E1122 Type 2 diabetes mellitus with diabetic chronic kidney disease: Secondary | ICD-10-CM | POA: Insufficient documentation

## 2017-07-01 DIAGNOSIS — R110 Nausea: Secondary | ICD-10-CM | POA: Insufficient documentation

## 2017-07-01 DIAGNOSIS — Z7984 Long term (current) use of oral hypoglycemic drugs: Secondary | ICD-10-CM | POA: Diagnosis not present

## 2017-07-01 DIAGNOSIS — Z7982 Long term (current) use of aspirin: Secondary | ICD-10-CM | POA: Insufficient documentation

## 2017-07-01 DIAGNOSIS — R197 Diarrhea, unspecified: Secondary | ICD-10-CM | POA: Diagnosis not present

## 2017-07-01 DIAGNOSIS — R509 Fever, unspecified: Secondary | ICD-10-CM | POA: Diagnosis not present

## 2017-07-01 DIAGNOSIS — F17228 Nicotine dependence, chewing tobacco, with other nicotine-induced disorders: Secondary | ICD-10-CM | POA: Diagnosis not present

## 2017-07-01 DIAGNOSIS — R531 Weakness: Secondary | ICD-10-CM | POA: Insufficient documentation

## 2017-07-01 DIAGNOSIS — F17290 Nicotine dependence, other tobacco product, uncomplicated: Secondary | ICD-10-CM | POA: Diagnosis not present

## 2017-07-01 DIAGNOSIS — Z8546 Personal history of malignant neoplasm of prostate: Secondary | ICD-10-CM | POA: Insufficient documentation

## 2017-07-01 DIAGNOSIS — Z79899 Other long term (current) drug therapy: Secondary | ICD-10-CM | POA: Insufficient documentation

## 2017-07-01 DIAGNOSIS — I129 Hypertensive chronic kidney disease with stage 1 through stage 4 chronic kidney disease, or unspecified chronic kidney disease: Secondary | ICD-10-CM | POA: Diagnosis not present

## 2017-07-01 LAB — URINALYSIS, COMPLETE (UACMP) WITH MICROSCOPIC
Bacteria, UA: NONE SEEN
Bilirubin Urine: NEGATIVE
GLUCOSE, UA: NEGATIVE mg/dL
HGB URINE DIPSTICK: NEGATIVE
KETONES UR: NEGATIVE mg/dL
LEUKOCYTES UA: NEGATIVE
NITRITE: NEGATIVE
PH: 5 (ref 5.0–8.0)
Protein, ur: NEGATIVE mg/dL
Specific Gravity, Urine: 1.015 (ref 1.005–1.030)

## 2017-07-01 LAB — CBC WITH DIFFERENTIAL/PLATELET
BASOS ABS: 0.1 10*3/uL (ref 0–0.1)
Basophils Relative: 0 %
Eosinophils Absolute: 0 10*3/uL (ref 0–0.7)
Eosinophils Relative: 0 %
HEMATOCRIT: 42.8 % (ref 40.0–52.0)
Hemoglobin: 14.1 g/dL (ref 13.0–18.0)
LYMPHS ABS: 1.2 10*3/uL (ref 1.0–3.6)
LYMPHS PCT: 8 %
MCH: 30.9 pg (ref 26.0–34.0)
MCHC: 33 g/dL (ref 32.0–36.0)
MCV: 93.8 fL (ref 80.0–100.0)
MONO ABS: 1.2 10*3/uL — AB (ref 0.2–1.0)
MONOS PCT: 8 %
NEUTROS ABS: 13 10*3/uL — AB (ref 1.4–6.5)
Neutrophils Relative %: 84 %
Platelets: 173 10*3/uL (ref 150–440)
RBC: 4.56 MIL/uL (ref 4.40–5.90)
RDW: 14.1 % (ref 11.5–14.5)
WBC: 15.5 10*3/uL — ABNORMAL HIGH (ref 3.8–10.6)

## 2017-07-01 LAB — COMPREHENSIVE METABOLIC PANEL
ALT: 34 U/L (ref 17–63)
ANION GAP: 11 (ref 5–15)
AST: 29 U/L (ref 15–41)
Albumin: 3.8 g/dL (ref 3.5–5.0)
Alkaline Phosphatase: 40 U/L (ref 38–126)
BUN: 27 mg/dL — ABNORMAL HIGH (ref 6–20)
CO2: 22 mmol/L (ref 22–32)
CREATININE: 1.52 mg/dL — AB (ref 0.61–1.24)
Calcium: 9.8 mg/dL (ref 8.9–10.3)
Chloride: 103 mmol/L (ref 101–111)
GFR, EST AFRICAN AMERICAN: 49 mL/min — AB (ref >60)
GFR, EST NON AFRICAN AMERICAN: 42 mL/min — AB (ref >60)
Glucose, Bld: 182 mg/dL — ABNORMAL HIGH (ref 65–99)
POTASSIUM: 4 mmol/L (ref 3.5–5.1)
Sodium: 136 mmol/L (ref 135–145)
Total Bilirubin: 0.9 mg/dL (ref 0.3–1.2)
Total Protein: 7.8 g/dL (ref 6.5–8.1)

## 2017-07-01 LAB — TROPONIN I: Troponin I: <0.03 ng/mL (ref <0.03)

## 2017-07-01 LAB — LACTIC ACID, PLASMA: Lactic Acid, Venous: 1.9 mmol/L (ref 0.5–1.9)

## 2017-07-01 LAB — INFLUENZA PANEL BY PCR (TYPE A & B)
Influenza A By PCR: NEGATIVE
Influenza B By PCR: NEGATIVE

## 2017-07-01 LAB — LIPASE, BLOOD: LIPASE: 32 U/L (ref 11–51)

## 2017-07-01 MED ORDER — SODIUM CHLORIDE 0.9 % IV BOLUS
1000.0000 mL | Freq: Once | INTRAVENOUS | Status: AC
Start: 2017-07-01 — End: 2017-07-01
  Administered 2017-07-01: 1000 mL via INTRAVENOUS

## 2017-07-01 MED ORDER — ONDANSETRON HCL 4 MG/2ML IJ SOLN
4.0000 mg | Freq: Once | INTRAMUSCULAR | Status: AC | PRN
  Administered 2017-07-01: 4 mg via INTRAVENOUS
  Filled 2017-07-01: qty 2

## 2017-07-01 NOTE — ED Provider Notes (Signed)
[facility] Emergency Department Provider Note  ____________________________________________  Time seen: Approximately 6:38 PM  I have reviewed the triage vital signs and the nursing notes.   HISTORY  Chief Complaint Fever and Nausea   HPI Scott Buckley is a 77 y.o. male with a history of diabetes, chronic kidney disease, kidney stones, prostate cancer, recurrent UTIs, hypertension who presents for evaluation of fever, nausea, weakness, diarrhea.  Patient reports 3 episodes of watery diarrhea last night.  That resolved today.  Today he started feeling generalized weakness and severe constant nausea. No vomiting. Had fever and chills at home today. No abdominal pain, flank pain, dysuria however he does endorse urinary frequency today. No CP, SOB, cough or URI symptoms. No rash or ulcers.    Past Medical History:  Diagnosis Date  . Aneurysm ([place])   . Cancer ([place])   . Diabetes mellitus without complication ([place])   . Hypertension   . Renal disorder     Patient Active Problem List   Diagnosis Date Noted  . 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 Sepsis ([place]) 07/01/2016  . UTI (urinary tract infection) 07/01/2016  . HTN (hypertension) 07/01/2016  . Diabetes ([place]) 07/01/2016  . CKD (chronic kidney disease), stage III ([place]) 07/01/2016    Past Surgical History:  Procedure Laterality Date  . HERNIA REPAIR    . JOINT REPLACEMENT    . TOTAL HIP REVISION      Prior to Admission medications   Medication Sig Start Date End Date Taking? Authorizing Provider  Acetaminophen (TYLENOL 8 HOUR PO) Take by mouth as needed.    [provider]  aspirin EC 81 MG tablet Take 81 mg by mouth daily.    [provider]  glimepiride (AMARYL) 2 MG tablet Take 2 mg by mouth daily with breakfast.    [provider]  hydrochlorothiazide (HYDRODIURIL) 25 MG tablet Take 25 mg by mouth daily.    [provider]  lisinopril  (PRINIVIL,ZESTRIL) 20 MG tablet Take 20 mg by mouth daily.    [provider]  metFORMIN (GLUCOPHAGE) 500 MG tablet Take 500 mg by mouth 2 (two) times daily with a meal.    [provider]    Allergies Sulfa antibiotics  No family history on file.  Social History Social History   Tobacco Use  . Smoking status: Light Tobacco Smoker    Types: Cigars  . Smokeless tobacco: Current User    Types: Chew  Substance Use Topics  . Alcohol use: Yes    Comment: "3-4oz of liquor a day'  . Drug use: No    Review of Systems  Constitutional: + fever, chills, generalized weakness Eyes: Negative for visual changes. ENT: Negative for sore throat. Neck: No neck pain  Cardiovascular: Negative for chest pain. Respiratory: Negative for shortness of breath. Gastrointestinal: Negative for abdominal pain, vomiting. + nausea and diarrhea. Genitourinary: Negative for dysuria. Musculoskeletal: Negative for back pain. Skin: Negative for rash. Neurological: Negative for headaches, weakness or numbness. Psych: No SI or HI  ____________________________________________   PHYSICAL EXAM:  VITAL SIGNS: ED Triage Vitals  Enc Vitals Group     BP 07/01/17 1823 110/79     Pulse Rate 07/01/17 1823 (!) 102     Resp 07/01/17 1823 20     Temp 07/01/17 1823 99 F (37.2 C)     Temp Source 07/01/17 1823 Oral     SpO2 07/01/17 1823 (!) 89 %     Weight 07/01/17  1802 251 lb (113.9 kg)     Height 07/01/17 1802 6\' 4"  (1.93 m)     Head Circumference --      Peak Flow --      Pain Score 07/01/17 1802 0     Pain Loc --      Pain Edu? --      Excl. in [place]? --     Constitutional: Alert and oriented. Well appearing and in no apparent distress. HEENT:      Head: Normocephalic and atraumatic.         Eyes: Conjunctivae are normal. Sclera is non-icteric.       Mouth/Throat: Mucous membranes are moist.       Neck: Supple with no signs of meningismus. Cardiovascular: Tachycardic with regular  rhythm. No murmurs, gallops, or rubs. 2+ symmetrical distal pulses are present in all extremities. No JVD. Respiratory: Normal respiratory effort. Lungs are clear to auscultation bilaterally. No wheezes, crackles, or rhonchi.  Gastrointestinal: Soft, non tender, and non distended with positive bowel sounds. No rebound or guarding. Genitourinary: No CVA tenderness. Musculoskeletal: Nontender with normal range of motion in all extremities. No edema, cyanosis, or erythema of extremities. Neurologic: Normal speech and language. Face is symmetric. Moving all extremities. No gross focal neurologic deficits are appreciated. Skin: Skin is warm, dry and intact. No rash noted. Psychiatric: Mood and affect are normal. Speech and behavior are normal.  ____________________________________________   LABS (all labs ordered are listed, but only abnormal results are displayed)  Labs Reviewed  COMPREHENSIVE METABOLIC PANEL - Abnormal; Notable for the following components:      Result Value   Glucose, Bld 182 (*)    BUN 27 (*)    Creatinine, Ser 1.52 (*)    GFR calc non Af Amer 42 (*)    GFR calc Af Amer 49 (*)    All other components within normal limits  CBC WITH DIFFERENTIAL/PLATELET - Abnormal; Notable for the following components:   WBC 15.5 (*)    Neutro Abs 13.0 (*)    Monocytes Absolute 1.2 (*)    All other components within normal limits  LIPASE, BLOOD  INFLUENZA PANEL BY PCR (TYPE A & B)  TROPONIN I  URINALYSIS, COMPLETE (UACMP) WITH MICROSCOPIC   ____________________________________________  EKG  ED ECG REPORT I, Rudene Re, the attending physician, personally viewed and interpreted this ECG.  Sinus tachycardia, rate of 102, first-degree AV block, normal QRS and QTC, normal axis, no ST elevations or depressions, Q waves in lead V3.  Unchanged from prior from 2018 ____________________________________________  RADIOLOGY  I have personally reviewed the images performed during  this visit and I agree with the Radiologist's read.   Interpretation by Radiologist:  Dg Chest 2 View  Result Date: 07/01/2017 CLINICAL DATA:  Fever, nausea, and dry heaving for 2 days.  Hypoxia. EXAM: CHEST - 2 VIEW COMPARISON:  07/01/2016 FINDINGS: The cardiac silhouette is normal in size. The thoracic aorta is mildly tortuous. The lungs are clear. No pleural effusion or pneumothorax is identified. No acute osseous abnormality is seen. IMPRESSION: No active cardiopulmonary disease. Electronically Signed   By: Logan Bores M.D.   On: 07/01/2017 19:26      ____________________________________________   PROCEDURES  Procedure(s) performed: None Procedures Critical Care performed:  None ____________________________________________   INITIAL IMPRESSION / ASSESSMENT AND PLAN / ED COURSE  77 y.o. male with a history of diabetes, chronic kidney disease, kidney stones, prostate cancer, recurrent UTIs, hypertension who presents for evaluation of  fever, nausea, weakness, diarrhea, urinary frequency.  Patient is well-appearing, no distress, satting 89% on room air with clear lungs, that improved on 2 L nasal cannula, low-grade temp of 99, heart rate of 102, abdomen soft with no tenderness, no ulcers or rashes.  Differential diagnosis including flu versus pneumonia versus urinary tract infection versus viral illness.  Will do chest x-ray, flu swab, EKG, basic labs, urinalysis.  Will give IV fluids and Zofran.  Clinical Course as of Jul 02 2047  Tue Jul 01, 2017  2042 Labs unchanged from baseline. CXR negative. Flu negative. UA pending. Patient was weaned off of oxygen and sating 100% on RA most likely consistent with atelectasis. Care transferred to Dr. Archie Balboa.   [CV]    Clinical Course User Index [CV] Alfred Levins [place], MD     As part of my medical decision making, I reviewed the following data within the [facility] notes reviewed and incorporated, Labs reviewed ,  EKG interpreted , Old EKG reviewed, Old chart reviewed, Radiograph reviewed , Notes from prior ED visits and [place] Controlled Substance Database    Pertinent labs & imaging results that were available during my care of the patient were reviewed by me and considered in my medical decision making (see chart for details).    ____________________________________________   FINAL CLINICAL IMPRESSION(S) / ED DIAGNOSES  Final diagnoses:  Fever, unspecified fever cause  Nausea  Generalized weakness      NEW MEDICATIONS STARTED DURING THIS VISIT:  ED Discharge Orders    None       Note:  This document was prepared using Dragon voice recognition software and may include unintentional dictation errors.    Rudene Re, MD 07/01/17 2049

## 2017-07-01 NOTE — ED Triage Notes (Signed)
Pt in via [facility] from home with complaints of fever, nausea, dry heaves x 2 days.  Pt dry heaving upon arrival.  NAD noted at this time.

## 2017-07-02 ENCOUNTER — Encounter: Payer: Self-pay | Admitting: Radiology

## 2017-07-02 ENCOUNTER — Emergency Department: Payer: Medicare HMO

## 2017-07-02 MED ORDER — ONDANSETRON 4 MG PO TBDP: 4.0000 mg | ORAL_TABLET | Freq: Three times a day (TID) | ORAL | 0 refills | Status: DC | PRN

## 2017-07-02 MED ORDER — IOPAMIDOL (ISOVUE-300) INJECTION 61%
75.0000 mL | Freq: Once | INTRAVENOUS | Status: AC | PRN
  Administered 2017-07-02: 75 mL via INTRAVENOUS

## 2017-07-02 NOTE — Discharge Instructions (Signed)
Fortunately today your blood work, your urinalysis, and your CT scan were reassuring.  Please use your nausea medication as needed for severe symptoms and follow-up with your primary care physician in 2 days for reevaluation.  Return to the emergency department sooner for any concerns.  It was a pleasure to take care of you today, and thank you for coming to our emergency department.  If you have any questions or concerns before leaving please ask the nurse to grab me and I'm more than happy to go through your aftercare instructions again.  If you were prescribed any opioid pain medication today such as Norco, Vicodin, Percocet, morphine, hydrocodone, or oxycodone please make sure you do not drive when you are taking this medication as it can alter your ability to drive safely.  If you have any concerns once you are home that you are not improving or are in fact getting worse before you can make it to your follow-up appointment, please do not hesitate to call 911 and come back for further evaluation.  Darel Hong, MD  Results for orders placed or performed during the hospital encounter of 07/01/17  Lipase, blood  Result Value Ref Range   Lipase 32 11 - 51 U/L  Comprehensive metabolic panel  Result Value Ref Range   Sodium 136 135 - 145 mmol/L   Potassium 4.0 3.5 - 5.1 mmol/L   Chloride 103 101 - 111 mmol/L   CO2 22 22 - 32 mmol/L   Glucose, Bld 182 (H) 65 - 99 mg/dL   BUN 27 (H) 6 - 20 mg/dL   Creatinine, Ser 1.52 (H) 0.61 - 1.24 mg/dL   Calcium 9.8 8.9 - 10.3 mg/dL   Total Protein 7.8 6.5 - 8.1 g/dL   Albumin 3.8 3.5 - 5.0 g/dL   AST 29 15 - 41 U/L   ALT 34 17 - 63 U/L   Alkaline Phosphatase 40 38 - 126 U/L   Total Bilirubin 0.9 0.3 - 1.2 mg/dL   GFR calc non Af Amer 42 (L) >60 mL/min   GFR calc Af Amer 49 (L) >60 mL/min   Anion gap 11 5 - 15  Urinalysis, Complete w Microscopic  Result Value Ref Range   Color, Urine YELLOW (A) YELLOW   APPearance CLEAR (A) CLEAR   Specific  Gravity, Urine 1.015 1.005 - 1.030   pH 5.0 5.0 - 8.0   Glucose, UA NEGATIVE NEGATIVE mg/dL   Hgb urine dipstick NEGATIVE NEGATIVE   Bilirubin Urine NEGATIVE NEGATIVE   Ketones, ur NEGATIVE NEGATIVE mg/dL   Protein, ur NEGATIVE NEGATIVE mg/dL   Nitrite NEGATIVE NEGATIVE   Leukocytes, UA NEGATIVE NEGATIVE   RBC / HPF 0-5 0 - 5 RBC/hpf   WBC, UA 0-5 0 - 5 WBC/hpf   Bacteria, UA NONE SEEN NONE SEEN   Squamous Epithelial / LPF 0-5 (A) NONE SEEN   Mucus PRESENT    Hyaline Casts, UA PRESENT   Influenza panel by PCR (type A & B)  Result Value Ref Range   Influenza A By PCR NEGATIVE NEGATIVE   Influenza B By PCR NEGATIVE NEGATIVE  CBC with Differential  Result Value Ref Range   WBC 15.5 (H) 3.8 - 10.6 K/uL   RBC 4.56 4.40 - 5.90 MIL/uL   Hemoglobin 14.1 13.0 - 18.0 g/dL   HCT 42.8 40.0 - 52.0 %   MCV 93.8 80.0 - 100.0 fL   MCH 30.9 26.0 - 34.0 pg   MCHC 33.0 32.0 - 36.0 g/dL  RDW 14.1 11.5 - 14.5 %   Platelets 173 150 - 440 K/uL   Neutrophils Relative % 84 %   Neutro Abs 13.0 (H) 1.4 - 6.5 K/uL   Lymphocytes Relative 8 %   Lymphs Abs 1.2 1.0 - 3.6 K/uL   Monocytes Relative 8 %   Monocytes Absolute 1.2 (H) 0.2 - 1.0 K/uL   Eosinophils Relative 0 %   Eosinophils Absolute 0.0 0 - 0.7 K/uL   Basophils Relative 0 %   Basophils Absolute 0.1 0 - 0.1 K/uL  Troponin I  Result Value Ref Range   Troponin I <0.03 <0.03 ng/mL  Lactic acid, plasma  Result Value Ref Range   Lactic Acid, Venous 1.9 0.5 - 1.9 mmol/L   Dg Chest 2 View  Result Date: 07/01/2017 CLINICAL DATA:  Fever, nausea, and dry heaving for 2 days.  Hypoxia. EXAM: CHEST - 2 VIEW COMPARISON:  07/01/2016 FINDINGS: The cardiac silhouette is normal in size. The thoracic aorta is mildly tortuous. The lungs are clear. No pleural effusion or pneumothorax is identified. No acute osseous abnormality is seen. IMPRESSION: No active cardiopulmonary disease. Electronically Signed   By: Logan Bores M.D.   On: 07/01/2017 19:26   Ct  Abdomen Pelvis W Contrast  Result Date: 07/02/2017 CLINICAL DATA:  77 year old male with fever nausea weakness and diarrhea. EXAM: CT ABDOMEN AND PELVIS WITH CONTRAST TECHNIQUE: Multidetector CT imaging of the abdomen and pelvis was performed using the standard protocol following bolus administration of intravenous contrast. CONTRAST:  60mL ISOVUE-300 IOPAMIDOL (ISOVUE-300) INJECTION 61% COMPARISON:  Abdomen and Pelvis CTA 04/14/2014 FINDINGS: Lower chest: Negative. No pericardial or pleural effusion. Chronic tortuosity of the descending thoracic aorta. Hepatobiliary: Chronic hepatic steatosis. Otherwise negative liver and gallbladder. Pancreas: Negative. Spleen: Negative. Adrenals/Urinary Tract: Normal adrenal glands. Chronic small left renal cysts. Chronic small surgical clips in the right pararenal space. Bilateral renal enhancement and contrast excretion appears symmetric and within normal limits. No hydroureter. Diminutive and unremarkable urinary bladder. Stomach/Bowel: Decompressed rectum. Redundant sigmoid colon. Moderate diverticulosis of the proximal sigmoid, and throughout the descending colon but no active inflammation. Decompressed transverse colon and right colon. Normal appendix (series 2, image 60). Negative terminal ileum. No dilated small bowel.  Decompressed stomach and duodenum. No abdominal free air or free fluid. 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 Coil embolization of the left internal iliac artery branches. The endograft and other major arterial structures in the abdomen and pelvis appear patent. Portal venous system appears patent. Reproductive: Negative. Other: No pelvic free fluid. Previous right inguinal hernia repair with mesh appears stable. Musculoskeletal: No acute osseous abnormality identified. Chronic left hip arthroplasty. IMPRESSION: 1. No acute or inflammatory  process in the abdomen or pelvis. 2. 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 No adverse features. 3. Diverticulosis of the large bowel without active inflammation. Normal appendix. 4. Chronic fatty liver disease. 5. Chronic postoperative changes to the right pararenal space. Electronically Signed   By: Genevie Ann M.D.   On: 07/02/2017 00:46

## 2017-07-02 NOTE — ED Provider Notes (Signed)
Care signed over from Dr. Archie Balboa pending results of CT scan.  Patient CT shows no acute disease.  The patient's symptoms are resolved and he is able to eat and drink.  I had a lengthy discussion with the patient and his wife at bedside regarding the diagnostic uncertainty and the importance of following up within 2 days for recheck.  Strict return precautions have been given.   Darel Hong, MD 07/02/17 [phone]

## 2017-07-03 ENCOUNTER — Other Ambulatory Visit: Payer: Self-pay | Admitting: Family Medicine

## 2017-07-03 ENCOUNTER — Ambulatory Visit
Admission: RE | Admit: 2017-07-03 | Discharge: 2017-07-03 | Disposition: A | Discharge status: Home / Self Care | Payer: Medicare HMO | Source: Ambulatory Visit | Attending: Family Medicine | Admitting: Family Medicine

## 2017-07-03 DIAGNOSIS — M7989 Other specified soft tissue disorders: Secondary | ICD-10-CM | POA: Diagnosis not present

## 2017-07-03 DIAGNOSIS — Z09 Encounter for follow-up examination after completed treatment for conditions other than malignant neoplasm: Secondary | ICD-10-CM | POA: Diagnosis not present

## 2017-07-17 DIAGNOSIS — L03116 Cellulitis of left lower limb: Secondary | ICD-10-CM | POA: Insufficient documentation

## 2017-08-12 ENCOUNTER — Emergency Department: Payer: Medicare HMO

## 2017-08-12 ENCOUNTER — Emergency Department
Admission: EM | Admit: 2017-08-12 | Discharge: 2017-08-12 | Disposition: A | Discharge status: Home / Self Care | Payer: Medicare HMO | Attending: Emergency Medicine | Admitting: Emergency Medicine

## 2017-08-12 ENCOUNTER — Other Ambulatory Visit: Payer: Self-pay

## 2017-08-12 DIAGNOSIS — N183 Chronic kidney disease, stage 3 (moderate): Secondary | ICD-10-CM | POA: Insufficient documentation

## 2017-08-12 DIAGNOSIS — R531 Weakness: Secondary | ICD-10-CM

## 2017-08-12 DIAGNOSIS — Z7982 Long term (current) use of aspirin: Secondary | ICD-10-CM | POA: Diagnosis not present

## 2017-08-12 DIAGNOSIS — F17220 Nicotine dependence, chewing tobacco, uncomplicated: Secondary | ICD-10-CM | POA: Insufficient documentation

## 2017-08-12 DIAGNOSIS — Z7984 Long term (current) use of oral hypoglycemic drugs: Secondary | ICD-10-CM | POA: Insufficient documentation

## 2017-08-12 DIAGNOSIS — E1122 Type 2 diabetes mellitus with diabetic chronic kidney disease: Secondary | ICD-10-CM | POA: Diagnosis not present

## 2017-08-12 DIAGNOSIS — R509 Fever, unspecified: Secondary | ICD-10-CM | POA: Insufficient documentation

## 2017-08-12 DIAGNOSIS — I129 Hypertensive chronic kidney disease with stage 1 through stage 4 chronic kidney disease, or unspecified chronic kidney disease: Secondary | ICD-10-CM | POA: Insufficient documentation

## 2017-08-12 DIAGNOSIS — F17290 Nicotine dependence, other tobacco product, uncomplicated: Secondary | ICD-10-CM | POA: Diagnosis not present

## 2017-08-12 LAB — CBC WITH DIFFERENTIAL/PLATELET
BASOS PCT: 0 %
Basophils Absolute: 0.1 10*3/uL (ref 0–0.1)
Eosinophils Absolute: 0 10*3/uL (ref 0–0.7)
Eosinophils Relative: 0 %
HEMATOCRIT: 40.5 % (ref 40.0–52.0)
HEMOGLOBIN: 13.5 g/dL (ref 13.0–18.0)
Lymphocytes Relative: 10 %
Lymphs Abs: 1.2 10*3/uL (ref 1.0–3.6)
MCH: 31.2 pg (ref 26.0–34.0)
MCHC: 33.2 g/dL (ref 32.0–36.0)
MCV: 93.9 fL (ref 80.0–100.0)
MONOS PCT: 15 %
Monocytes Absolute: 1.7 10*3/uL — ABNORMAL HIGH (ref 0.2–1.0)
NEUTROS PCT: 75 %
Neutro Abs: 8.5 10*3/uL — ABNORMAL HIGH (ref 1.4–6.5)
Platelets: 252 10*3/uL (ref 150–440)
RBC: 4.31 MIL/uL — AB (ref 4.40–5.90)
RDW: 14 % (ref 11.5–14.5)
WBC: 11.3 10*3/uL — AB (ref 3.8–10.6)

## 2017-08-12 LAB — COMPREHENSIVE METABOLIC PANEL
ALT: 47 U/L (ref 17–63)
ANION GAP: 13 (ref 5–15)
AST: 59 U/L — ABNORMAL HIGH (ref 15–41)
Albumin: 3.5 g/dL (ref 3.5–5.0)
Alkaline Phosphatase: 51 U/L (ref 38–126)
BILIRUBIN TOTAL: 0.9 mg/dL (ref 0.3–1.2)
BUN: 40 mg/dL — ABNORMAL HIGH (ref 6–20)
CO2: 25 mmol/L (ref 22–32)
Calcium: 9.7 mg/dL (ref 8.9–10.3)
Chloride: 95 mmol/L — ABNORMAL LOW (ref 101–111)
Creatinine, Ser: 1.97 mg/dL — ABNORMAL HIGH (ref 0.61–1.24)
GFR calc non Af Amer: 31 mL/min — ABNORMAL LOW (ref >60)
GFR, EST AFRICAN AMERICAN: 36 mL/min — AB (ref >60)
GLUCOSE: 131 mg/dL — AB (ref 65–99)
POTASSIUM: 4.4 mmol/L (ref 3.5–5.1)
SODIUM: 133 mmol/L — AB (ref 135–145)
TOTAL PROTEIN: 8.3 g/dL — AB (ref 6.5–8.1)

## 2017-08-12 LAB — URINALYSIS, COMPLETE (UACMP) WITH MICROSCOPIC
BILIRUBIN URINE: NEGATIVE
Glucose, UA: NEGATIVE mg/dL
KETONES UR: NEGATIVE mg/dL
LEUKOCYTES UA: NEGATIVE
Nitrite: NEGATIVE
PH: 5 (ref 5.0–8.0)
Protein, ur: NEGATIVE mg/dL
Specific Gravity, Urine: 1.016 (ref 1.005–1.030)

## 2017-08-12 LAB — LACTIC ACID, PLASMA: Lactic Acid, Venous: 1.7 mmol/L (ref 0.5–1.9)

## 2017-08-12 LAB — PROTIME-INR
INR: 1.09
PROTHROMBIN TIME: 14 s (ref 11.4–15.2)

## 2017-08-12 MED ORDER — ACETAMINOPHEN 325 MG PO TABS
650.0000 mg | ORAL_TABLET | Freq: Once | ORAL | Status: AC
  Administered 2017-08-12: 650 mg via ORAL
  Filled 2017-08-12: qty 2

## 2017-08-12 MED ORDER — SODIUM CHLORIDE 0.9 % IV BOLUS
1000.0000 mL | Freq: Once | INTRAVENOUS | Status: AC
  Administered 2017-08-12: 1000 mL via INTRAVENOUS

## 2017-08-12 NOTE — ED Triage Notes (Addendum)
Pt arrives from PCP with fever of 102. Feeling bad since Friday. Wife states low BP. Had EMT at house to check on pt. Wife states pt is weak and can barely stand up. No tylenol or motrin. Denies CP, SOB, N&V&D. Denies cough. Alert, oriented. In wheelchair. Wife states that pt has been urinating more frequently. CBG at home has been under 200.   Hx DM.

## 2017-08-12 NOTE — ED Provider Notes (Signed)
[facility] Emergency Department Provider Note  ____________________________________________   I have reviewed the triage vital signs and the nursing notes.   HISTORY  Chief Complaint Fever and Weakness   History limited by: Not Limited   HPI Scott Buckley is a 77 y.o. male who presents to the emergency department today sent from doctors office because of fever. Patient states that he went to doctors office because of weakness. Has been feeling weak for the past three days. Notices that he is having a hard time getting up and getting out of chairs. He denies any associated chest pain, cough or shortness of breath. The patient states he had similar symptoms and was seen in March. Did not find the etiology at that time, but states he has been feeling better since then until a few days ago. Was found to have a fever at doctors office, states highest temp at home of 99.    Per medical record review patient has a history of DM, HTN.  Past Medical History:  Diagnosis Date  . Aneurysm ([place])   . Cancer ([place])   . Diabetes mellitus without complication ([place])   . Hypertension   . Renal disorder     Patient Active Problem List   Diagnosis Date Noted  . 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 Sepsis ([place]) 07/01/2016  . UTI (urinary tract infection) 07/01/2016  . HTN (hypertension) 07/01/2016  . Diabetes ([place]) 07/01/2016  . CKD (chronic kidney disease), stage III ([place]) 07/01/2016    Past Surgical History:  Procedure Laterality Date  . HERNIA REPAIR    . JOINT REPLACEMENT    . TOTAL HIP REVISION      Prior to Admission medications   Medication Sig Start Date End Date Taking? Authorizing Provider  Acetaminophen (TYLENOL 8 HOUR PO) Take by mouth as needed.    [provider]  aspirin EC 81 MG tablet Take 81 mg by mouth daily.    [provider]  glimepiride (AMARYL) 2 MG tablet Take 2 mg by mouth daily with breakfast.    [provider]  hydrochlorothiazide (HYDRODIURIL) 25 MG tablet Take 25 mg by mouth daily.    [provider]  lisinopril (PRINIVIL,ZESTRIL) 20 MG tablet Take 20 mg by mouth daily.    [provider]  metFORMIN (GLUCOPHAGE) 500 MG tablet Take 500 mg by mouth 2 (two) times daily with a meal.    [provider]  ondansetron (ZOFRAN ODT) 4 MG disintegrating tablet Take 1 tablet (4 mg total) by mouth every 8 (eight) hours as needed for nausea or vomiting. 07/02/17   Darel Hong, MD    Allergies Sulfa antibiotics  History reviewed. No pertinent family history.  Social History Social History   Tobacco Use  . Smoking status: Light Tobacco Smoker    Types: Cigars  . Smokeless tobacco: Current User    Types: Chew  Substance Use Topics  . Alcohol use: Yes    Comment: "3-4oz of liquor a day'  . Drug use: No    Review of Systems Constitutional: No fever/chills. Positive for generalized weakness.  Eyes: No visual changes. ENT: No sore throat. Cardiovascular: Denies chest pain. Respiratory: Denies shortness of breath. Gastrointestinal: No abdominal pain.  No nausea, no vomiting.  No diarrhea.   Genitourinary: Negative for dysuria. Musculoskeletal: Negative for back pain. Skin: Negative for rash. Neurological: Negative for headaches, focal weakness or numbness.  ____________________________________________   PHYSICAL EXAM:  VITAL SIGNS: ED Triage Vitals  Enc Vitals Group     BP 08/12/17 1340 106/67     Pulse Rate 08/12/17 1340 (!) 109     Resp 08/12/17 1340 18     Temp 08/12/17 1340 (!) 102.4 F (39.1 C)     Temp Source 08/12/17 1340 Oral     SpO2 08/12/17 1340 90 %     Weight --      Height --      Head Circumference --      Peak Flow --      Pain Score 08/12/17 1341 0   Constitutional: Alert and oriented. Well appearing and in no distress. Eyes: Conjunctivae are normal.  ENT   Head: Normocephalic and atraumatic.   Nose: No  congestion/rhinnorhea.   Mouth/Throat: Mucous membranes are moist.   Neck: No stridor. Hematological/Lymphatic/Immunilogical: No cervical lymphadenopathy. Cardiovascular: Normal rate, regular rhythm.  No murmurs, rubs, or gallops.  Respiratory: Normal respiratory effort without tachypnea nor retractions. Breath sounds are clear and equal bilaterally. No wheezes/rales/rhonchi. Gastrointestinal: Soft and non tender. No rebound. No guarding.  Genitourinary: Deferred Musculoskeletal: Normal range of motion in all extremities. No lower extremity edema. Neurologic:  Normal speech and language. No gross focal neurologic deficits are appreciated.  Skin:  Skin is warm, dry and intact. No rash noted. Psychiatric: Mood and affect are normal. Speech and behavior are normal. Patient exhibits appropriate insight and judgment.  ____________________________________________    LABS (pertinent positives/negatives)  CMP na 133, glu 131, bun 40, cr 1.97 Lactic 1.7 CBC wbc 11.3, hgb 13.5, plt 252  ____________________________________________   EKG  I, Nance Pear, attending physician, personally viewed and interpreted this EKG  EKG Time: 1356 Rate: 112 Rhythm: sinus tachycardia  Axis: left axis deviation Intervals: qtc 436 QRS: narrow ST changes: no st elevation Impression: abnormal ekg  ____________________________________________    RADIOLOGY  CXR No acute disease   ____________________________________________   PROCEDURES  Procedures  ____________________________________________   INITIAL IMPRESSION / ASSESSMENT AND PLAN / ED COURSE  Pertinent labs & imaging results that were available during my care of the patient were reviewed by me and considered in my medical decision making (see chart for details).  Patient presents from [facility] office today with main complaint of generalized weakness.  Differential would be broad.  Clinical history and exam no obvious  source of infection.  Blood work without any significant leukocytosis.  No significant anemia and electrolytes without any overly concerning findings.  Creatinine was a little bit elevated concerning for some dehydration.  Patient did feel better after IV fluids.  At this point unclear etiology of fever, perhaps viral but again no obvious source of infection.  At this point given the patient feels better and lack of other concerning findings do feel patient is appropriate for further outpatient work-up.  ____________________________________________   FINAL CLINICAL IMPRESSION(S) / ED DIAGNOSES  Final diagnoses:  Generalized weakness     Note: This dictation was prepared with Dragon dictation. Any transcriptional errors that result from this process are unintentional     Nance Pear, MD 08/13/17 [phone]

## 2017-08-12 NOTE — ED Notes (Signed)
Pt cleared by MD to have po fluid. Pt given water by RN

## 2017-08-12 NOTE — ED Notes (Signed)
Pt up on side of bed to attempt to urinate with no success

## 2017-08-12 NOTE — ED Notes (Signed)
Pt up on side of bed with assistance to attempt to urinate. Pt was unable to urinate at this time

## 2017-08-12 NOTE — Discharge Instructions (Addendum)
Please seek medical attention for any high fevers, chest pain, shortness of breath, change in behavior, persistent vomiting, bloody stool or any other new or concerning symptoms.  

## 2017-08-14 ENCOUNTER — Other Ambulatory Visit: Payer: Self-pay | Admitting: Infectious Diseases

## 2017-08-14 DIAGNOSIS — Z125 Encounter for screening for malignant neoplasm of prostate: Secondary | ICD-10-CM | POA: Insufficient documentation

## 2017-08-14 DIAGNOSIS — R29898 Other symptoms and signs involving the musculoskeletal system: Secondary | ICD-10-CM

## 2017-08-14 LAB — URINE CULTURE: Culture: NO GROWTH

## 2017-08-15 ENCOUNTER — Ambulatory Visit
Admission: RE | Admit: 2017-08-15 | Discharge: 2017-08-15 | Disposition: A | Discharge status: Home / Self Care | Payer: Medicare HMO | Source: Ambulatory Visit | Attending: Infectious Diseases | Admitting: Infectious Diseases

## 2017-08-15 DIAGNOSIS — I671 Cerebral aneurysm, nonruptured: Secondary | ICD-10-CM | POA: Diagnosis not present

## 2017-08-15 DIAGNOSIS — R29898 Other symptoms and signs involving the musculoskeletal system: Secondary | ICD-10-CM

## 2017-08-17 LAB — CULTURE, BLOOD (ROUTINE X 2)
CULTURE: NO GROWTH
Culture: NO GROWTH
Special Requests: ADEQUATE
Special Requests: ADEQUATE

## 2017-08-18 DIAGNOSIS — R700 Elevated erythrocyte sedimentation rate: Secondary | ICD-10-CM | POA: Insufficient documentation

## 2017-08-22 DIAGNOSIS — H02105 Unspecified ectropion of left lower eyelid: Secondary | ICD-10-CM | POA: Insufficient documentation

## 2017-09-09 ENCOUNTER — Ambulatory Visit (INDEPENDENT_AMBULATORY_CARE_PROVIDER_SITE_OTHER): Payer: Medicare HMO

## 2017-09-09 ENCOUNTER — Encounter (INDEPENDENT_AMBULATORY_CARE_PROVIDER_SITE_OTHER): Payer: Self-pay | Admitting: Vascular Surgery

## 2017-09-09 ENCOUNTER — Ambulatory Visit (INDEPENDENT_AMBULATORY_CARE_PROVIDER_SITE_OTHER): Payer: Medicare HMO | Admitting: Vascular Surgery

## 2017-09-09 VITALS — BP 139/80 | HR 78 | Resp 16 | Ht 76.0 in | Wt 249.0 lb

## 2017-09-09 DIAGNOSIS — E118 Type 2 diabetes mellitus with unspecified complications: Secondary | ICD-10-CM

## 2017-09-09 DIAGNOSIS — I723 Aneurysm of iliac artery: Secondary | ICD-10-CM

## 2017-09-09 DIAGNOSIS — I671 Cerebral aneurysm, nonruptured: Secondary | ICD-10-CM | POA: Diagnosis not present

## 2017-09-09 DIAGNOSIS — I10 Essential (primary) hypertension: Secondary | ICD-10-CM | POA: Diagnosis not present

## 2017-09-09 NOTE — Assessment & Plan Note (Signed)
Quite large.  Has seen neurosurgery.  Has opted for no repair at this time

## 2017-09-09 NOTE — Assessment & Plan Note (Signed)
The aneurysm has shrunk down to 3.0 cm in maximal diameter around the stent graft without endoleak.  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  This is stable from previous studies. Doing well several years status post repair of a ruptured aneurysm.  Continue to follow on an annual basis.

## 2017-09-09 NOTE — Progress Notes (Signed)
MRN : [id number]  Scott Buckley is a 77 y.o. (1941-01-03) male who presents with chief complaint of  Chief Complaint  Patient presents with  . Follow-up    35yrEvar  .  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  He has done very well from this.  The aneurysm has shrunk down to 3.0 cm in maximal diameter around the stent graft without endoleak.  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  This is stable from previous studies. 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  He has met with neurosurgery and has opted for no surgical repair at this time.  Current Outpatient Medications  Medication Sig Dispense Refill  . Acetaminophen (TYLENOL 8 HOUR PO) Take by mouth as needed.    .Marland Kitchenaspirin EC 81 MG tablet Take 81 mg by mouth daily.    . Cholecalciferol (VITAMIN D3) 2000 units capsule Take by mouth.    . cyanocobalamin 1000 MCG tablet     . glimepiride (AMARYL) 1 MG tablet Take by mouth.    . hydrochlorothiazide (HYDRODIURIL) 25 MG tablet Take 25 mg by mouth daily.    .Marland Kitchenlisinopril (PRINIVIL,ZESTRIL) 20 MG tablet Take 20 mg by mouth daily.    . ondansetron (ZOFRAN ODT) 4 MG disintegrating tablet Take 1 tablet (4 mg total) by mouth every 8 (eight) hours as needed for nausea or vomiting. 20 tablet 0  . glimepiride (AMARYL) 2 MG tablet Take 2 mg by mouth daily with breakfast.    . metFORMIN (GLUCOPHAGE) 500 MG tablet Take 500 mg by mouth 2 (two) times daily with a meal.     No current facility-administered medications for this visit.     Past Medical History:  Diagnosis Date  . Aneurysm ([place]   . Cancer ([place]   . Diabetes mellitus without complication ([place]   . Hypertension   . Renal disorder     Past Surgical History:  Procedure Laterality  Date  . HERNIA REPAIR    . JOINT REPLACEMENT    . TOTAL HIP REVISION      Social History  Substance Use Topics  . Smoking status: Light Tobacco Smoker    Types: Cigars  . Smokeless tobacco: Never Used  . Alcohol use Yes      Comment: "3-4oz of liquor a day'     Family History No bleeding or clotting disorders      Allergies  Allergen Reactions  . Sulfa Antibiotics Nausea And Vomiting     REVIEW OF SYSTEMS (Negative unless checked)  Constitutional: '[]' Weight loss  '[]' Fever  '[]' Chills Cardiac: '[]' Chest pain   '[]' Chest pressure   '[]' Palpitations   '[]' Shortness of breath when laying flat   '[]' Shortness of breath at rest   '[]' Shortness of breath with exertion. Vascular:  '[]' Pain in legs with walking   '[]' Pain in legs at rest   '[]' Pain in legs when laying flat   '[]' Claudication   '[]' Pain in feet when walking  '[]' Pain in feet at rest  '[]' Pain in feet when laying flat   '[]' History of DVT   '[]' Phlebitis   '[]' Swelling in legs   '[]' Varicose veins   '[]' Non-healing ulcers Pulmonary:   '[]' Uses home oxygen   '[]' Productive cough   '[]' Hemoptysis   '[]'   Wheeze  '[]' COPD   '[]' Asthma Neurologic:  '[]' Dizziness  '[]' Blackouts   '[]' Seizures   '[]' History of stroke   '[]' History of TIA  '[]' Aphasia   '[]' Temporary blindness   '[]' Dysphagia   '[]' Weakness or numbness in arms   '[]' Weakness or numbness in legs Musculoskeletal:  '[x]' Arthritis   '[]' Joint swelling   '[]' Joint pain   '[x]' Low back pain Hematologic:  '[]' Easy bruising  '[]' Easy bleeding   '[]' Hypercoagulable state   '[]' Anemic   Gastrointestinal:  '[]' Blood in stool   '[]' Vomiting blood  '[]' Gastroesophageal reflux/heartburn   '[]' Abdominal pain Genitourinary:  '[]' Chronic kidney disease   '[]' Difficult urination  '[x]' Frequent urination  '[]' Burning with urination   '[]' Hematuria Skin:  '[]' Rashes   '[]' Ulcers   '[]' Wounds Psychological:  '[]' History of anxiety   '[]'  History of major depression.      Physical Examination  BP 139/80 (BP Location: Right Arm)   Pulse 78   Resp 16   Ht '6\' 4"'  (1.93 m)   Wt  249 lb (112.9 kg)   BMI 30.31 kg/m  Gen:  WD/WN, NAD Head: [place]/AT, No temporalis wasting. Ear/Nose/Throat: Hearing grossly intact, nares w/o erythema or drainage Eyes: left conjunctiva injected with some lid droop Neck: Supple.  Trachea midline Pulmonary:  Good air movement, no use of accessory muscles.  Cardiac: RRR, no JVD Vascular:  Vessel Right Left  Radial Palpable Palpable                          PT Palpable Palpable  DP Palpable Palpable    Musculoskeletal: M/S 5/5 throughout.  No deformity or atrophy. 1+ BLE edema. Neurologic: Sensation grossly intact in extremities.  Symmetrical.  Speech is fluent.  Psychiatric: Judgment intact, Mood & affect appropriate for pt's clinical situation. Dermatologic: No rashes or ulcers noted.  No cellulitis or open wounds.       Labs Recent Results (from the past 2160 hour(s))  Lipase, blood     Status: None   Collection Time: 07/01/17  6:24 PM  Result Value Ref Range   Lipase 32 11 - 51 U/L    Comment: Performed at [facility], [address] [place], [place] [address]  Comprehensive metabolic panel     Status: Abnormal   Collection Time: 07/01/17  6:24 PM  Result Value Ref Range   Sodium 136 135 - 145 mmol/L   Potassium 4.0 3.5 - 5.1 mmol/L   Chloride 103 101 - 111 mmol/L   CO2 22 22 - 32 mmol/L   Glucose, Bld 182 (H) 65 - 99 mg/dL   BUN 27 (H) 6 - 20 mg/dL   Creatinine, Ser 1.52 (H) 0.61 - 1.24 mg/dL   Calcium 9.8 8.9 - 10.3 mg/dL   Total Protein 7.8 6.5 - 8.1 g/dL   Albumin 3.8 3.5 - 5.0 g/dL   AST 29 15 - 41 U/L   ALT 34 17 - 63 U/L   Alkaline Phosphatase 40 38 - 126 U/L   Total Bilirubin 0.9 0.3 - 1.2 mg/dL   GFR calc non Af Amer 42 (L) >60 mL/min   GFR calc Af Amer 49 (L) >60 mL/min    Comment: (NOTE) The eGFR has been calculated using the CKD EPI equation. This calculation has not been validated in all clinical situations. eGFR's persistently <60 mL/min signify possible Chronic Kidney Disease.     Anion gap 11 5 - 15    Comment: Performed at [facility], [address] [place], [place] [address]  Urinalysis, Complete w Microscopic     Status: Abnormal   Collection Time: 07/01/17  6:24 PM  Result Value Ref Range   Color, Urine YELLOW (A) YELLOW   APPearance CLEAR (A) CLEAR   Specific Gravity, Urine 1.015 1.005 - 1.030   pH 5.0 5.0 - 8.0   Glucose, UA NEGATIVE NEGATIVE mg/dL   Hgb urine dipstick NEGATIVE NEGATIVE   Bilirubin Urine NEGATIVE NEGATIVE   Ketones, ur NEGATIVE NEGATIVE mg/dL   Protein, ur NEGATIVE NEGATIVE mg/dL   Nitrite NEGATIVE NEGATIVE   Leukocytes, UA NEGATIVE NEGATIVE   RBC / HPF 0-5 0 - 5 RBC/hpf   WBC, UA 0-5 0 - 5 WBC/hpf   Bacteria, UA NONE SEEN NONE SEEN   Squamous Epithelial / LPF 0-5 (A) NONE SEEN   Mucus PRESENT    Hyaline Casts, UA PRESENT     Comment: Performed at [facility], [address] [place], [place] [address]  CBC with Differential     Status: Abnormal   Collection Time: 07/01/17  6:24 PM  Result Value Ref Range   WBC 15.5 (H) 3.8 - 10.6 K/uL   RBC 4.56 4.40 - 5.90 MIL/uL   Hemoglobin 14.1 13.0 - 18.0 g/dL   HCT 42.8 40.0 - 52.0 %   MCV 93.8 80.0 - 100.0 fL   MCH 30.9 26.0 - 34.0 pg   MCHC 33.0 32.0 - 36.0 g/dL   RDW 14.1 11.5 - 14.5 %   Platelets 173 150 - 440 K/uL   Neutrophils Relative % 84 %   Neutro Abs 13.0 (H) 1.4 - 6.5 K/uL   Lymphocytes Relative 8 %   Lymphs Abs 1.2 1.0 - 3.6 K/uL   Monocytes Relative 8 %   Monocytes Absolute 1.2 (H) 0.2 - 1.0 K/uL   Eosinophils Relative 0 %   Eosinophils Absolute 0.0 0 - 0.7 K/uL   Basophils Relative 0 %   Basophils Absolute 0.1 0 - 0.1 K/uL    Comment: Performed at [facility], [address] [place], [place] [address]  Troponin I     Status: None   Collection Time: 07/01/17  6:24 PM  Result Value Ref Range   Troponin I <0.03 <0.03 ng/mL    Comment: Performed at [facility], [address] [place], [place] [address]  Influenza  panel by PCR (type A & B)     Status: None   Collection Time: 07/01/17  6:30 PM  Result Value Ref Range   Influenza A By PCR NEGATIVE NEGATIVE   Influenza B By PCR NEGATIVE NEGATIVE    Comment: (NOTE) The Xpert Xpress Flu assay is intended as an aid in the diagnosis of  influenza and should not be used as a sole basis for treatment.  This  assay is FDA approved for nasopharyngeal swab specimens only. Nasal  washings and aspirates are unacceptable for Xpert Xpress Flu testing. Performed at [facility], [address] [place], [place] [address]   Lactic acid, plasma     Status: None   Collection Time: 07/01/17 10:54 PM  Result Value Ref Range   Lactic Acid, Venous 1.9 0.5 - 1.9 mmol/L    Comment: Performed at [facility], [address] [place], [place] [address]  Comprehensive metabolic panel     Status: Abnormal   Collection Time: 08/12/17  1:43 PM  Result Value Ref Range   Sodium 133 (L) 135 - 145 mmol/L   Potassium 4.4 3.5 - 5.1 mmol/L  Chloride 95 (L) 101 - 111 mmol/L   CO2 25 22 - 32 mmol/L   Glucose, Bld 131 (H) 65 - 99 mg/dL   BUN 40 (H) 6 - 20 mg/dL   Creatinine, Ser 1.97 (H) 0.61 - 1.24 mg/dL   Calcium 9.7 8.9 - 10.3 mg/dL   Total Protein 8.3 (H) 6.5 - 8.1 g/dL   Albumin 3.5 3.5 - 5.0 g/dL   AST 59 (H) 15 - 41 U/L   ALT 47 17 - 63 U/L   Alkaline Phosphatase 51 38 - 126 U/L   Total Bilirubin 0.9 0.3 - 1.2 mg/dL   GFR calc non Af Amer 31 (L) >60 mL/min   GFR calc Af Amer 36 (L) >60 mL/min    Comment: (NOTE) The eGFR has been calculated using the CKD EPI equation. This calculation has not been validated in all clinical situations. eGFR's persistently <60 mL/min signify possible Chronic Kidney Disease.    Anion gap 13 5 - 15    Comment: Performed at [facility], [address] [place], [place] [address]  Lactic acid, plasma     Status: None   Collection Time: 08/12/17  1:43 PM  Result Value Ref Range   Lactic Acid, Venous 1.7  0.5 - 1.9 mmol/L    Comment: Performed at [facility], [address] [place], [place] [address]  CBC with Differential     Status: Abnormal   Collection Time: 08/12/17  1:43 PM  Result Value Ref Range   WBC 11.3 (H) 3.8 - 10.6 K/uL   RBC 4.31 (L) 4.40 - 5.90 MIL/uL   Hemoglobin 13.5 13.0 - 18.0 g/dL   HCT 40.5 40.0 - 52.0 %   MCV 93.9 80.0 - 100.0 fL   MCH 31.2 26.0 - 34.0 pg   MCHC 33.2 32.0 - 36.0 g/dL   RDW 14.0 11.5 - 14.5 %   Platelets 252 150 - 440 K/uL   Neutrophils Relative % 75 %   Neutro Abs 8.5 (H) 1.4 - 6.5 K/uL   Lymphocytes Relative 10 %   Lymphs Abs 1.2 1.0 - 3.6 K/uL   Monocytes Relative 15 %   Monocytes Absolute 1.7 (H) 0.2 - 1.0 K/uL   Eosinophils Relative 0 %   Eosinophils Absolute 0.0 0 - 0.7 K/uL   Basophils Relative 0 %   Basophils Absolute 0.1 0 - 0.1 K/uL    Comment: Performed at [facility], [address] [place], [place] [address]  Protime-INR     Status: None   Collection Time: 08/12/17  1:43 PM  Result Value Ref Range   Prothrombin Time 14.0 11.4 - 15.2 seconds   INR 1.09     Comment: Performed at [facility], [address] [place], [place] [address]  Culture, blood (Routine x 2)     Status: None   Collection Time: 08/12/17  1:43 PM  Result Value Ref Range   Specimen Description BLOOD BLOOD RIGHT FOREARM    Special Requests      BOTTLES DRAWN AEROBIC AND ANAEROBIC Blood Culture adequate volume   Culture      NO GROWTH 5 DAYS Performed at [facility], [address], [place], [place] [address]    Report Status 08/17/2017 FINAL   Culture, blood (Routine x 2)     Status: None   Collection Time: 08/12/17  1:43 PM  Result Value Ref Range   Specimen Description BLOOD BLOOD LEFT FOREARM    Special Requests  BOTTLES DRAWN AEROBIC AND ANAEROBIC Blood Culture adequate volume   Culture      NO GROWTH 5 DAYS Performed at [facility], [address] [place], [place] [address]    Report  Status 08/17/2017 FINAL   Urinalysis, Complete w Microscopic     Status: Abnormal   Collection Time: 08/12/17  7:25 PM  Result Value Ref Range   Color, Urine AMBER (A) YELLOW    Comment: BIOCHEMICALS MAY BE AFFECTED BY COLOR   APPearance HAZY (A) CLEAR   Specific Gravity, Urine 1.016 1.005 - 1.030   pH 5.0 5.0 - 8.0   Glucose, UA NEGATIVE NEGATIVE mg/dL   Hgb urine dipstick SMALL (A) NEGATIVE   Bilirubin Urine NEGATIVE NEGATIVE   Ketones, ur NEGATIVE NEGATIVE mg/dL   Protein, ur NEGATIVE NEGATIVE mg/dL   Nitrite NEGATIVE NEGATIVE   Leukocytes, UA NEGATIVE NEGATIVE   RBC / HPF 0-5 0 - 5 RBC/hpf   WBC, UA 0-5 0 - 5 WBC/hpf   Bacteria, UA RARE (A) NONE SEEN   Squamous Epithelial / LPF 0-5 0 - 5    Comment: Please note change in reference range.   Mucus PRESENT     Comment: Performed at [facility], [address], [place], [place] [address]  Urine Culture     Status: None   Collection Time: 08/12/17  7:25 PM  Result Value Ref Range   Specimen Description      URINE, RANDOM Performed at [facility], [address] [place], [place] [address]    Special Requests      NONE Performed at [facility], [address], [place], [place] [address]    Culture      NO GROWTH Performed at [facility], [address], [place], [place] [address]    Report Status 08/14/2017 FINAL     Radiology Dg Chest 2 View  Result Date: 08/12/2017 CLINICAL DATA:  Pt arrives from PCP with fever of 102. Feeling bad since Friday. Wife states low BP, patient has increased weakness and difficulty walking and standing, best possible images obtained due to patient condition, history of aneurysm, cancer, diabetes, hypertension and renal disorder. Smoker. EXAM: CHEST - 2 VIEW COMPARISON:  07/01/2017 FINDINGS: Cardiac silhouette is normal in size. No mediastinal or hilar masses. No evidence of adenopathy. Clear lungs.  No pleural effusion or pneumothorax. Skeletal structures  are demineralized but intact. IMPRESSION: No active cardiopulmonary disease. Electronically Signed   By: Lajean Manes M.D.   On: 08/12/2017 14:48   Ct Head Wo Contrast  Addendum Date: 08/15/2017   ADDENDUM REPORT: 08/15/2017 13:18 ADDENDUM: Study discussed by telephone with Dr. Adrian Prows on 08/15/2017 at 1310 hours. Electronically Signed   By: Genevie Ann M.D.   On: 08/15/2017 13:18   Result Date: 08/15/2017 CLINICAL DATA:  77 year old male with lower extremity weakness for the past 3 days. EXAM: CT HEAD WITHOUT CONTRAST TECHNIQUE: Contiguous axial images were obtained from the base of the skull through the vertex without intravenous contrast. COMPARISON:  Report of noncontrast head CT 03/04/2001 (no images available). FINDINGS: Brain: There is a large round and saccular intermediate density extra-axial mass at the left skull base which appears to communicate with dilated and ectatic vessels likely reflecting the distal left ICA and left MCA origin. This appears to be a giant intracranial aneurysm measuring up to 26 millimeters diameter (26 x 22 x 26 millimeters AP by transverse by CC). There is only mild regional mass  effect. No associated cerebral edema. No acute intracranial hemorrhage is identified. No other intracranial mass lesion or mass effect is identified. No ventriculomegaly. Overall cerebral volume is within normal limits for age. Normal for age gray-white matter signal throughout the brain. No cortical encephalomalacia identified. No cortically based acute infarct identified. Vascular: Abnormal left ICA terminus as stated above. Underlying generalized intracranial artery tortuosity is suspected. The right ICA terminus appears mildly to moderately ectatic estimated at 8 millimeters diameter. Skull: Negative.  No acute osseous abnormality identified. Sinuses/Orbits: Paranasal sinuses, tympanic cavities, and mastoids are clear. Other: Visualized orbit soft tissues are within normal limits. No  acute scalp soft tissue findings. IMPRESSION: 1. Positive for giant intracranial artery aneurysm. A 2.6 cm aneurysm of the left ICA terminus appears un-ruptured. There is mild regional mass effect and no associated cerebral edema. Recommend Neurosurgery consultation. Intracranial CTA or in MRA would be valuable for confirmation and treatment planning. 2. Brain parenchyma itself appears within normal limits for age by noncontrast CT. Electronically Signed: By: Genevie Ann M.D. On: 08/15/2017 12:45    Assessment/Plan HTN (hypertension) blood pressure control important in reducing the progression of atherosclerotic disease. On appropriate oral medications.   Diabetes ([place]) blood glucose control important in reducing the progression of atherosclerotic disease. Also, involved in wound healing. On appropriate medications.    Intracranial aneurysm Quite large.  Has seen neurosurgery.  Has opted for no repair at this time  Iliac artery aneurysm (HCC) The aneurysm has shrunk down to 3.0 cm in maximal diameter around the stent graft without endoleak.  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  This is stable from previous studies. Doing well several years status post repair of a ruptured aneurysm.  Continue to follow on an annual basis.    Leotis Pain, MD  09/09/2017 10:16 AM    This note was created with Dragon medical transcription system.  Any errors from dictation are purely unintentional

## 2018-06-10 DIAGNOSIS — E538 Deficiency of other specified B group vitamins: Secondary | ICD-10-CM | POA: Insufficient documentation

## 2018-09-15 ENCOUNTER — Other Ambulatory Visit (INDEPENDENT_AMBULATORY_CARE_PROVIDER_SITE_OTHER): Payer: Medicare HMO

## 2018-09-15 ENCOUNTER — Ambulatory Visit (INDEPENDENT_AMBULATORY_CARE_PROVIDER_SITE_OTHER): Payer: Medicare HMO | Admitting: Vascular Surgery

## 2018-10-26 DIAGNOSIS — H02122 Mechanical ectropion of right lower eyelid: Secondary | ICD-10-CM | POA: Insufficient documentation

## 2018-10-26 DIAGNOSIS — H5340 Unspecified visual field defects: Secondary | ICD-10-CM | POA: Insufficient documentation

## 2018-10-27 ENCOUNTER — Ambulatory Visit (INDEPENDENT_AMBULATORY_CARE_PROVIDER_SITE_OTHER): Payer: Medicare HMO

## 2018-10-27 ENCOUNTER — Other Ambulatory Visit: Payer: Self-pay

## 2018-10-27 ENCOUNTER — Encounter (INDEPENDENT_AMBULATORY_CARE_PROVIDER_SITE_OTHER): Payer: Self-pay | Admitting: Vascular Surgery

## 2018-10-27 ENCOUNTER — Ambulatory Visit (INDEPENDENT_AMBULATORY_CARE_PROVIDER_SITE_OTHER): Payer: Medicare HMO | Admitting: Vascular Surgery

## 2018-10-27 VITALS — BP 137/76 | HR 69 | Resp 16 | Wt 253.0 lb

## 2018-10-27 DIAGNOSIS — Z95828 Presence of other vascular implants and grafts: Secondary | ICD-10-CM

## 2018-10-27 DIAGNOSIS — I723 Aneurysm of iliac artery: Secondary | ICD-10-CM | POA: Diagnosis not present

## 2018-10-27 DIAGNOSIS — F17290 Nicotine dependence, other tobacco product, uncomplicated: Secondary | ICD-10-CM

## 2018-10-27 DIAGNOSIS — E119 Type 2 diabetes mellitus without complications: Secondary | ICD-10-CM

## 2018-10-27 DIAGNOSIS — I671 Cerebral aneurysm, nonruptured: Secondary | ICD-10-CM

## 2018-10-27 DIAGNOSIS — I10 Essential (primary) hypertension: Secondary | ICD-10-CM

## 2018-10-27 DIAGNOSIS — Z7984 Long term (current) use of oral hypoglycemic drugs: Secondary | ICD-10-CM

## 2018-10-27 DIAGNOSIS — Z79899 Other long term (current) drug therapy: Secondary | ICD-10-CM

## 2018-10-27 NOTE — Progress Notes (Signed)
MRN : [id number]  Scott [place] is a 78 y.o. ([birth year]) male who presents with chief complaint of  Chief Complaint  Patient presents with  . Follow-up    ultrasound follow up  .  History of Present Illness: Patient returns today in follow up of his iliac aneurysm.  He is 4-1/2 years status post repair of a ruptured iliac aneurysm with a stent graft.  He is doing well with no aneurysm related symptoms.  He has 2 intracranial aneurysms that are being managed conservatively.  He denies any current aneurysm related symptoms. Duplex is done today. Stent graft is patent with no endoleak.  The iliac aneurysm measures only 1.7 cm in maximal diameter and the aorta is now down below 3 cm in maximal diameter.   Current Outpatient Medications  Medication Sig Dispense Refill  . Acetaminophen (TYLENOL 8 HOUR PO) Take by mouth as needed.    Marland Kitchen aspirin EC 81 MG tablet Take 81 mg by mouth daily.    . Cholecalciferol (VITAMIN D3) 2000 units capsule Take by mouth.    . cyanocobalamin 1000 MCG tablet     . glimepiride (AMARYL) 4 MG tablet Take 4 mg by mouth.     . hydrochlorothiazide (HYDRODIURIL) 25 MG tablet Take 25 mg by mouth daily.    Marland Kitchen lisinopril (PRINIVIL,ZESTRIL) 20 MG tablet Take 20 mg by mouth daily.    Marland Kitchen glimepiride (AMARYL) 2 MG tablet Take 2 mg by mouth daily with breakfast.    . metFORMIN (GLUCOPHAGE) 500 MG tablet Take 500 mg by mouth 2 (two) times daily with a meal.    . ondansetron (ZOFRAN ODT) 4 MG disintegrating tablet Take 1 tablet (4 mg total) by mouth every 8 (eight) hours as needed for nausea or vomiting. (Patient not taking: Reported on 10/27/2018) 20 tablet 0   No current facility-administered medications for this visit.     Past Medical History:  Diagnosis Date  . Aneurysm ([place])   . Cancer ([place])   . Diabetes mellitus without complication ([place])   . Hypertension   . Renal disorder     Past Surgical History:  Procedure Laterality Date  . HERNIA REPAIR    . JOINT  REPLACEMENT    . TOTAL HIP REVISION      Social History Social History   Tobacco Use  . Smoking status: Light Tobacco Smoker    Types: Cigars  . Smokeless tobacco: Current User    Types: Chew  Substance Use Topics  . Alcohol use: Yes    Comment: "3-4oz of liquor a day'  . Drug use: No     Family History Family History  Problem Relation Age of Onset  . Hyperlipidemia Mother   . Hypertension Mother   . Hypertension Father   . Heart disease Father      Allergies  Allergen Reactions  . Sulfa Antibiotics Nausea And Vomiting     REVIEW OF SYSTEMS(Negative unless checked)  Constitutional: [] ?Weight loss[] ?Fever[] ?Chills Cardiac:[] ?Chest pain[] ?Chest pressure[] ?Palpitations [] ?Shortness of breath when laying flat [] ?Shortness of breath at rest [] ?Shortness of breath with exertion. Vascular: [] ?Pain in legs with walking[] ?Pain in legsat rest[] ?Pain in legs when laying flat [] ?Claudication [] ?Pain in feet when walking [] ?Pain in feet at rest [] ?Pain in feet when laying flat [] ?History of DVT [] ?Phlebitis [] ?Swelling in legs [] ?Varicose veins [] ?Non-healing ulcers Pulmonary: [] ?Uses home oxygen [] ?Productive cough[] ?Hemoptysis [] ?Wheeze [] ?COPD [] ?Asthma Neurologic: [] ?Dizziness [] ?Blackouts [] ?Seizures [] ?History of stroke [] ?History of TIA[] ?Aphasia [] ?Temporary blindness[] ?Dysphagia [] ?Weaknessor numbness in arms [] ?Weakness or numbnessin legs  Musculoskeletal: [x] ?Arthritis [] ?Joint swelling [] ?Joint pain [x] ?Low back pain Hematologic:[] ?Easy bruising[] ?Easy bleeding [] ?Hypercoagulable state [] ?Anemic  Gastrointestinal:[] ?Blood in stool[] ?Vomiting blood[] ?Gastroesophageal reflux/heartburn[] ?Abdominal pain Genitourinary: [] ?Chronic kidney disease [] ?Difficulturination [x] ?Frequenturination [] ?Burning with urination[] ?Hematuria Skin: [] ?Rashes [] ?Ulcers [] ?Wounds  Psychological: [] ?History of anxiety[] ?History of major depression.  Physical Examination  BP 137/76   Pulse 69   Resp 16   Wt 253 lb (114.8 kg)   BMI 30.80 kg/m  Gen:  WD/WN, NAD Head: [place]/AT, No temporalis wasting. Ear/Nose/Throat: Hearing grossly intact, nares w/o erythema or drainage Eyes: Conjunctiva clear. Sclera non-icteric Neck: Supple.  Trachea midline Pulmonary:  Good air movement, no use of accessory muscles.  Cardiac: RRR, no JVD Vascular:  Vessel Right Left  Radial Palpable Palpable                          PT 1+ Palpable 1+ Palpable  DP Palpable 1+ Palpable   Gastrointestinal: soft, non-tender/non-distended. No guarding/reflex.  Musculoskeletal: M/S 5/5 throughout.  No deformity or atrophy. Mild BLE edema. Neurologic: Sensation grossly intact in extremities.  Symmetrical.  Speech is fluent.  Psychiatric: Judgment intact, Mood & affect appropriate for pt's clinical situation. Dermatologic: No rashes or ulcers noted.  No cellulitis or open wounds.       Labs No results found for this or any previous visit (from the past 2160 hour(s)).  Radiology No results found.  Assessment/Plan HTN (hypertension) blood pressure control important in reducing the progression of atherosclerotic disease. On appropriate oral medications.   Diabetes ([place]) blood glucose control important in reducing the progression of atherosclerotic disease. Also, involved in wound healing. On appropriate medications.    Intracranial aneurysm Quite large.  Has seen neurosurgery.  Has opted for no repair at this time  Iliac artery aneurysm [facility]) Stent graft is patent with no endoleak.  The iliac aneurysm measures only 1.7 cm in maximal diameter and the aorta is now down below 3 cm in maximal diameter.  We will do 1 more annual checkup and if that looks okay I think we can go to every other year.  He is almost 5 years out from his repair at this point.    Leotis Pain, MD   10/27/2018 8:59 AM    This note was created with Dragon medical transcription system.  Any errors from dictation are purely unintentional

## 2018-10-27 NOTE — Assessment & Plan Note (Signed)
Stent graft is patent with no endoleak.  The iliac aneurysm measures only 1.7 cm in maximal diameter and the aorta is now down below 3 cm in maximal diameter.  We will do 1 more annual checkup and if that looks okay I think we can go to every other year.  He is almost 5 years out from his repair at this point.

## 2019-05-10 ENCOUNTER — Encounter: Payer: Self-pay | Admitting: *Deleted

## 2019-05-10 ENCOUNTER — Other Ambulatory Visit: Payer: Self-pay

## 2019-05-10 ENCOUNTER — Encounter: Payer: Medicare HMO | Attending: Family Medicine | Admitting: *Deleted

## 2019-05-10 VITALS — BP 110/74 | Ht 75.0 in | Wt 251.2 lb

## 2019-05-10 DIAGNOSIS — Z713 Dietary counseling and surveillance: Secondary | ICD-10-CM | POA: Diagnosis not present

## 2019-05-10 DIAGNOSIS — E119 Type 2 diabetes mellitus without complications: Secondary | ICD-10-CM

## 2019-05-10 DIAGNOSIS — E1165 Type 2 diabetes mellitus with hyperglycemia: Secondary | ICD-10-CM | POA: Diagnosis present

## 2019-05-10 NOTE — Progress Notes (Signed)
Diabetes Self-Management Education  Visit Type: First/Initial  Appt. Start Time: 1340 Appt. End Time: [id number]  05/10/2019  [name], identified by name and date of birth, is a 79 y.o. male with a diagnosis of Diabetes: Type 2.   ASSESSMENT  Blood pressure 110/74, height 6\' 3"  (1.905 m), weight 251 lb 3.2 oz (113.9 kg). Body mass index is 31.4 kg/m.  Diabetes Self-Management Education - 05/10/19 1509      Visit Information   Visit Type  First/Initial      Initial Visit   Diabetes Type  Type 2    Are you currently following a meal plan?  Yes    What type of meal plan do you follow?  "cut down on sweets and excessive eating"    Are you taking your medications as prescribed?  Yes    Date Diagnosed  20 years ago      Health Coping   How would you rate your overall health?  Good      Psychosocial Assessment   Patient Belief/Attitude about Diabetes  Other (comment)   "irritable and no patience"   Self-care barriers  None    Self-management support  Doctor's office;Family    Other persons present  Spouse/SO    Patient Concerns  Nutrition/Meal planning;Weight Control;Glycemic Control;Monitoring;Healthy Lifestyle    Special Needs  None    Preferred Learning Style  Auditory    Learning Readiness  Ready    How often do you need to have someone help you when you read instructions, pamphlets, or other written materials from your doctor or pharmacy?  1 - Never    What is the last grade level you completed in school?  college      Pre-Education Assessment   Patient understands the diabetes disease and treatment process.  Needs Review    Patient understands incorporating nutritional management into lifestyle.  Needs Instruction    Patient undertands incorporating physical activity into lifestyle.  Needs Review    Patient understands using medications safely.  Needs Instruction    Patient understands monitoring blood glucose, interpreting and using results  Needs Review    Patient  understands prevention, detection, and treatment of acute complications.  Needs Instruction    Patient understands prevention, detection, and treatment of chronic complications.  Needs Review    Patient understands how to develop strategies to address psychosocial issues.  Needs Instruction    Patient understands how to develop strategies to promote health/change behavior.  Needs Instruction      Complications   Last HgB A1C per patient/outside source  7.7 %   03/22/2019   How often do you check your blood sugar?  1-2 times/day    Fasting Blood glucose range (mg/dL)  130-179   Pt reports FBG's 150-165 mg/dL   Have you had a dilated eye exam in the past 12 months?  Yes    Have you had a dental exam in the past 12 months?  Yes    Are you checking your feet?  Yes    How many days per week are you checking your feet?  7      Dietary Intake   Breakfast  eats a banana with cranberry juice (sometimes othee fruits like watermelon or cantaloupe) then eats eggs, bacon or ham, grits or has brown, toast or biscuit    Lunch  peanut butter crackers or ham sandwich    Snack (afternoon)  eats nuts for snack or trail mix    Dinner  chicken, beef, pork, fish with potatoes, peas, beans, corn, spaghetti, green beans, salads, greens, cabbage, tomatoes, carrots, cuccumbers    Beverage(s)  water, tea or coffee sweetened with artificial sweetener      Exercise   Exercise Type  Light (walking / raking leaves)    How many days per week to you exercise?  5    How many minutes per day do you exercise?  20    Total minutes per week of exercise  100      Patient Education   Previous Diabetes Education  Yes (please comment)   came to diabetes classes 10 years ago   Disease state   Definition of diabetes, type 1 and 2, and the diagnosis of diabetes;Factors that contribute to the development of diabetes;Explored patient's options for treatment of their diabetes    Nutrition management   Role of diet in the treatment  of diabetes and the relationship between the three main macronutrients and blood glucose level;Food label reading, portion sizes and measuring food.;Reviewed blood glucose goals for pre and post meals and how to evaluate the patients' food intake on their blood glucose level.;Meal timing in regards to the patients' current diabetes medication.    Physical activity and exercise   Role of exercise on diabetes management, blood pressure control and cardiac health.    Medications  Reviewed patients medication for diabetes, action, purpose, timing of dose and side effects.    Monitoring  Purpose and frequency of SMBG.;Taught/discussed recording of test results and interpretation of SMBG.;Identified appropriate SMBG and/or A1C goals.;Daily foot exams    Acute complications  Taught treatment of hypoglycemia - the 15 rule.    Chronic complications  Relationship between chronic complications and blood glucose control    Psychosocial adjustment  Identified and addressed patients feelings and concerns about diabetes      Individualized Goals (developed by patient)   Reducing Risk  Improve blood sugars Prevent diabetes complications Lose weight Lead a healthier lifestyle Become more fit     Outcomes   Expected Outcomes  Demonstrated interest in learning. Expect positive outcomes    Future DMSE  2 wks       Individualized Plan for Diabetes Self-Management Training:   Learning Objective:  Patient will have a greater understanding of diabetes self-management. Patient education plan is to attend individual and/or group sessions per assessed needs and concerns.   Plan:   Patient Instructions  Check blood sugars 1 x day before breakfast or 2 hrs after one meal every day Bring blood sugar records to the next appointment Exercise: Continue walking for  15-20  minutes  4-6  days a week Eat 3 meals day,  1-2  snacks a day Space meals 4-6 hours apart Avoid sugar sweetened drinks (juices) unless treating a  low blood sugar Complete 3 Day Food Record and bring to next appt Return for appointment on: Monday May 31, 2019 at 1:30 pm with Freda Munro (nurse)  Expected Outcomes:  Demonstrated interest in learning. Expect positive outcomes  Education material provided:  General Meal Planning Guidelines Simple Meal Plan 3 Day Food Record Symptoms, causes and treatments of Hypoglycemia  If problems or questions, patient to contact team via:  Johny Drilling, [place], [place], CDE [phone]  Future DSME appointment: 3 wks May 31, 2019 with the nurse

## 2019-05-10 NOTE — Patient Instructions (Addendum)
Check blood sugars 1 x day before breakfast or 2 hrs after one meal every day Bring blood sugar records to the next appointment  Exercise: Continue walking for  15-20  minutes  4-6  days a week  Eat 3 meals day,  1-2  snacks a day Space meals 4-6 hours apart Avoid sugar sweetened drinks (juices) unless treating a low blood sugar Complete 3 Day Food Record and bring to next appt  Return for appointment on: Monday May 31, 2019 at 1:30 pm with Freda Munro (nurse)

## 2019-05-31 ENCOUNTER — Encounter: Payer: Medicare HMO | Admitting: *Deleted

## 2019-05-31 ENCOUNTER — Other Ambulatory Visit: Payer: Self-pay

## 2019-05-31 ENCOUNTER — Encounter: Payer: Self-pay | Admitting: *Deleted

## 2019-05-31 VITALS — BP 116/80 | Wt 251.4 lb

## 2019-05-31 DIAGNOSIS — E119 Type 2 diabetes mellitus without complications: Secondary | ICD-10-CM

## 2019-05-31 DIAGNOSIS — E1165 Type 2 diabetes mellitus with hyperglycemia: Secondary | ICD-10-CM | POA: Diagnosis not present

## 2019-05-31 NOTE — Patient Instructions (Addendum)
Check blood sugars before breakfast or 2 hrs after one meal every day Bring blood sugar records to the next MD appointment  Exercise: Continue walking or  15-20  minutes  4-6  days a week  Eat 3 meals day,  2-3  snacks a day Space meals 4-6 hours apart Limit desserts/sweets especially at bedtime Continue to avoid sugar sweetened drinks (tea, soda, juices)  Call back for any questions

## 2019-06-01 NOTE — Progress Notes (Signed)
Diabetes Self-Management Education  Visit Type: Follow-up  Appt. Start Time: 1330 Appt. End Time: [id number]  05/31/2019  Scott Buckley, identified by name and date of birth, is a 79 y.o. male with a diagnosis of Diabetes: Type 2.   ASSESSMENT  Blood pressure 116/80, weight 251 lb 6.4 oz (114 kg). Body mass index is 31.42 kg/m.  Diabetes Self-Management Education - 05/31/19 1544      Visit Information   Visit Type  Follow-up      Initial Visit   Diabetes Type  Type 2      Complications   How often do you check your blood sugar?  1-2 times/day    Fasting Blood glucose range (mg/dL)  130-179;180-200   FBG's 130-181 mg/dL   Postprandial Blood glucose range (mg/dL)  130-179   pp's 139-171 mg/dL   Number of hypoglycemic episodes per month  0    Have you had a dilated eye exam in the past 12 months?  Yes    Have you had a dental exam in the past 12 months?  No    Are you checking your feet?  Yes    How many days per week are you checking your feet?  7      Dietary Intake   Breakfast  3 meals and 3 snacks   some meals with 4-5 servings of carbs   Snack (morning)  continues to have sweets at bedtime      Exercise   Exercise Type  Light (walking / raking leaves)    How many days per week to you exercise?  5    How many minutes per day do you exercise?  20    Total minutes per week of exercise  100      Post-Education Assessment   Patient understands the diabetes disease and treatment process.  Demonstrates understanding / competency    Patient understands incorporating nutritional management into lifestyle.  Needs Review    Patient undertands incorporating physical activity into lifestyle.  Demonstrates understanding / competency    Patient understands using medications safely.  Demonstrates understanding / competency    Patient understands monitoring blood glucose, interpreting and using results  Demonstrates understanding / competency    Patient understands prevention,  detection, and treatment of acute complications.  Demonstrates understanding / competency    Patient understands prevention, detection, and treatment of chronic complications.  Demonstrates understanding / competency    Patient understands how to develop strategies to address psychosocial issues.  Demonstrates understanding / competency    Patient understands how to develop strategies to promote health/change behavior.  Demonstrates understanding / competency      Outcomes   Program Status  Completed      Subsequent Visit   Since your last visit have you continued or begun to take your medications as prescribed?  Yes    Since your last visit have you had your blood pressure checked?  No    Since your last visit have you experienced any weight changes?  No change    Since your last visit, are you checking your blood glucose at least once a day?  Yes       Individualized Plan for Diabetes Self-Management Training:   Learning Objective:  Patient will have a greater understanding of diabetes self-management. Patient education plan is to attend individual and/or group sessions per assessed needs and concerns.   Plan:   Patient Instructions  Check blood sugars before breakfast or 2 hrs after one  meal every day Bring blood sugar records to the next MD appointment Exercise: Continue walking or  15-20  minutes  4-6  days a week Eat 3 meals day,  2-3  snacks a day Space meals 4-6 hours apart Limit desserts/sweets especially at bedtime Continue to avoid sugar sweetened drinks (tea, soda, juices) Call back for any questions  Education material provided:  Planning a Balanced Meal  If problems or questions, patient to contact team via:  Johny Drilling, [place], [place], CDE [phone]  Future DSME appointment: PRN

## 2019-07-06 DIAGNOSIS — Z85828 Personal history of other malignant neoplasm of skin: Secondary | ICD-10-CM | POA: Insufficient documentation

## 2019-10-29 ENCOUNTER — Encounter (INDEPENDENT_AMBULATORY_CARE_PROVIDER_SITE_OTHER): Payer: Self-pay | Admitting: Vascular Surgery

## 2019-10-29 ENCOUNTER — Ambulatory Visit (INDEPENDENT_AMBULATORY_CARE_PROVIDER_SITE_OTHER): Payer: Medicare HMO

## 2019-10-29 ENCOUNTER — Ambulatory Visit (INDEPENDENT_AMBULATORY_CARE_PROVIDER_SITE_OTHER): Payer: Medicare HMO | Admitting: Vascular Surgery

## 2019-10-29 ENCOUNTER — Other Ambulatory Visit: Payer: Self-pay

## 2019-10-29 VITALS — BP 132/68 | HR 68 | Resp 16 | Wt 254.0 lb

## 2019-10-29 DIAGNOSIS — I671 Cerebral aneurysm, nonruptured: Secondary | ICD-10-CM | POA: Diagnosis not present

## 2019-10-29 DIAGNOSIS — I723 Aneurysm of iliac artery: Secondary | ICD-10-CM

## 2019-10-29 DIAGNOSIS — E119 Type 2 diabetes mellitus without complications: Secondary | ICD-10-CM

## 2019-10-29 DIAGNOSIS — I10 Essential (primary) hypertension: Secondary | ICD-10-CM

## 2019-10-29 NOTE — Progress Notes (Signed)
MRN : [id number]  Scott Buckley is a 79 y.o. (29-Jun-1940) male who presents with chief complaint of  Chief Complaint  Patient presents with   Follow-up    ultrasound follow up  .  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  He is doing well and currently has no aneurysm related symptoms.  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 Duplex today shows a patent stent graft with no evidence of endoleak.  The aorta measures 3.1 cm.  The right iliac artery measures 2.06 cm.  The left iliac artery measures 1.93 cm.  These are all doing well and expected size after a stent graft aneurysm repair for ruptured iliac artery aneurysm over 5 years ago.  Current Outpatient Medications  Medication Sig Dispense Refill   aspirin EC 81 MG tablet Take 81 mg by mouth daily.     Cholecalciferol (VITAMIN D3) 2000 units capsule Take 2,000 Units by mouth daily.      cyanocobalamin (,VITAMIN B-12,) 1000 MCG/ML injection Inject 1,000 mcg into the muscle every 30 (thirty) days.     glimepiride (AMARYL) 4 MG tablet Take 4 mg by mouth 2 (two) times daily.     hydrochlorothiazide (HYDRODIURIL) 25 MG tablet Take 25 mg by mouth daily.     JANUVIA 50 MG tablet Take 50 mg by mouth daily.     ketoconazole (NIZORAL) 2 % shampoo Apply 1 application topically 2 (two) times a week.     lisinopril (PRINIVIL,ZESTRIL) 20 MG tablet Take 20 mg by mouth daily.     Multiple Vitamin (MULTIVITAMIN) tablet Take 1 tablet by mouth daily.     rosuvastatin (CRESTOR) 20 MG tablet Take 20 mg by mouth at bedtime.     No current facility-administered medications for this visit.    Past Medical History:  Diagnosis Date   Aneurysm ([place])    Cancer ([place])    Diabetes mellitus without complication ([place])    Hypertension    Renal disorder      Past Surgical History:  Procedure Laterality Date   HERNIA REPAIR     JOINT REPLACEMENT     TOTAL HIP REVISION       Social History   Tobacco Use   Smoking status: Current Every Day Smoker    Years: 50.00    Types: Cigars   Smokeless tobacco: Never Used   Tobacco comment: 1 cigar per day  Vaping Use   Vaping Use: Never used  Substance Use Topics   Alcohol use: Yes    Alcohol/week: 4.0 - 6.0 standard drinks    Types: 4 - 6 Cans of beer per week   Drug use: No     Family History  Problem Relation Age of Onset   Hyperlipidemia Mother    Hypertension Mother    Diabetes Mother    Hypertension Father    Heart disease Father    Diabetes Brother     Allergies  Allergen Reactions   Sulfa Antibiotics Nausea And Vomiting and Other (See Comments)    Patient states not allergic   Varicella Virus Vaccine Live Other (See Comments)    Cramps and weakness    REVIEW OF SYSTEMS(Negative unless checked)  Constitutional: [] ??Weight loss[] ??Fever[] ??Chills Cardiac:[] ??Chest pain[] ??Chest pressure[] ??Palpitations [] ??Shortness of breath when laying flat [] ??Shortness of breath at rest [] ??Shortness of breath with exertion.  Vascular: [] ??Pain in legs with walking[] ??Pain in legsat rest[] ??Pain in legs when laying flat [] ??Claudication [] ??Pain in feet when walking [] ??Pain in feet at rest [] ??Pain in feet when laying flat [] ??History of DVT [] ??Phlebitis [] ??Swelling in legs [] ??Varicose veins [] ??Non-healing ulcers Pulmonary: [] ??Uses home oxygen [] ??Productive cough[] ??Hemoptysis [] ??Wheeze [] ??COPD [] ??Asthma Neurologic: [] ??Dizziness [] ??Blackouts [] ??Seizures [] ??History of stroke [] ??History of TIA[] ??Aphasia [] ??Temporary blindness[] ??Dysphagia [] ??Weaknessor numbness in arms [] ??Weakness or numbnessin legs Musculoskeletal: [x] ??Arthritis [] ??Joint swelling [] ??Joint pain  [x] ??Low back pain Hematologic:[] ??Easy bruising[] ??Easy bleeding [] ??Hypercoagulable state [] ??Anemic  Gastrointestinal:[] ??Blood in stool[] ??Vomiting blood[] ??Gastroesophageal reflux/heartburn[] ??Abdominal pain Genitourinary: [] ??Chronic kidney disease [] ??Difficulturination [x] ??Frequenturination [] ??Burning with urination[] ??Hematuria Skin: [] ??Rashes [] ??Ulcers [] ??Wounds Psychological: [] ??History of anxiety[] ??History of major depression.  Physical Examination  BP (!) 132/68 (BP Location: Right Arm)    Pulse 68    Resp 16    Wt (!) 254 lb (115.2 kg)    BMI 31.75 kg/m  Gen:  WD/WN, NAD Head: [place]/AT, No temporalis wasting. Ear/Nose/Throat: Hearing grossly intact, nares w/o erythema or drainage Eyes: Conjunctiva clear. Sclera non-icteric Neck: Supple.  Trachea midline Pulmonary:  Good air movement, no use of accessory muscles.  Cardiac: RRR, no JVD Vascular:  Vessel Right Left  Radial Palpable Palpable                                   Gastrointestinal: soft, non-tender/non-distended. No guarding/reflex.  Musculoskeletal: M/S 5/5 throughout.  No deformity or atrophy.  Trace lower extremity edema. Neurologic: Sensation grossly intact in extremities.  Symmetrical.  Speech is fluent.  Psychiatric: Judgment intact, Mood & affect appropriate for pt's clinical situation. Dermatologic: He has bandages on his head from recent skin cancer surgery.       Labs No results found for this or any previous visit (from the past 2160 hour(s)).  Radiology No results found.  Assessment/Plan HTN (hypertension) blood pressure control important in reducing the progression of atherosclerotic disease. On appropriate oral medications.   Diabetes ([place]) blood glucose control important in reducing the progression of atherosclerotic disease. Also, involved in wound healing. On appropriate medications.   Intracranial aneurysm Quite large. Has  seen neurosurgery. Has opted for no repair at this time.  Had further follow-up from this with his recent skin cancer treatments and had scans, and these appear to remain stable per his report and he still plans on no surgery.  Iliac artery aneurysm ([facility]) Duplex today shows a patent stent graft with no evidence of endoleak.  The aorta measures 3.1 cm.  The right iliac artery measures 2.06 cm.  The left iliac artery measures 1.93 cm.  These are all doing well and expected size after a stent graft aneurysm repair for ruptured iliac artery aneurysm over 5 years ago.  We will continue an annual follow-up at this time    Leotis Pain, MD  10/29/2019 9:03 AM    This note was created with Dragon medical transcription system.  Any errors from dictation are purely unintentional

## 2019-10-29 NOTE — Assessment & Plan Note (Signed)
Duplex today shows a patent stent graft with no evidence of endoleak.  The aorta measures 3.1 cm.  The right iliac artery measures 2.06 cm.  The left iliac artery measures 1.93 cm.  These are all doing well and expected size after a stent graft aneurysm repair for ruptured iliac artery aneurysm over 5 years ago.  We will continue an annual follow-up at this time

## 2020-10-24 ENCOUNTER — Other Ambulatory Visit: Payer: Self-pay

## 2020-10-24 ENCOUNTER — Encounter (INDEPENDENT_AMBULATORY_CARE_PROVIDER_SITE_OTHER): Payer: Self-pay | Admitting: Vascular Surgery

## 2020-10-24 ENCOUNTER — Ambulatory Visit (INDEPENDENT_AMBULATORY_CARE_PROVIDER_SITE_OTHER): Payer: Medicare HMO

## 2020-10-24 ENCOUNTER — Ambulatory Visit (INDEPENDENT_AMBULATORY_CARE_PROVIDER_SITE_OTHER): Payer: Medicare HMO | Admitting: Vascular Surgery

## 2020-10-24 VITALS — BP 133/79 | HR 77 | Resp 16 | Wt 248.0 lb

## 2020-10-24 DIAGNOSIS — I671 Cerebral aneurysm, nonruptured: Secondary | ICD-10-CM

## 2020-10-24 DIAGNOSIS — E119 Type 2 diabetes mellitus without complications: Secondary | ICD-10-CM

## 2020-10-24 DIAGNOSIS — I723 Aneurysm of iliac artery: Secondary | ICD-10-CM

## 2020-10-24 DIAGNOSIS — I10 Essential (primary) hypertension: Secondary | ICD-10-CM | POA: Diagnosis not present

## 2020-10-24 NOTE — Progress Notes (Signed)
MRN : [id number]  Scott Buckley is a 80 y.o. (Jul 04, 1940) male who presents with chief complaint of  Chief Complaint  Patient presents with   Follow-up    Ultrasound follow up  .  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  He had a ruptured iliac artery aneurysm repaired about 6 years ago and has done well from this.  He denies any current aneurysm related symptoms. Specifically, the patient denies new back or abdominal pain, or signs of peripheral embolization.  The patient reports he recently saw neurosurgery at [facility] for his intracranial aneurysm which was stable.  His duplex today does show some more enlargement of his aorta than the last study but still just over 4 cm in diameter.  The stent graft is widely patent and there is no endoleak.  The iliac arteries are less than 2 cm.  Current Outpatient Medications  Medication Sig Dispense Refill   acetaminophen (TYLENOL) 500 MG tablet Take 500 mg by mouth every 6 (six) hours as needed.     aspirin EC 81 MG tablet Take 81 mg by mouth daily.     Cholecalciferol (VITAMIN D3) 2000 units capsule Take 2,000 Units by mouth daily.      cyanocobalamin (,VITAMIN B-12,) 1000 MCG/ML injection Inject 1,000 mcg into the muscle every 30 (thirty) days.     glimepiride (AMARYL) 4 MG tablet Take 4 mg by mouth 2 (two) times daily.     hydrochlorothiazide (HYDRODIURIL) 25 MG tablet Take 25 mg by mouth daily.     ketoconazole (NIZORAL) 2 % shampoo Apply 1 application topically 2 (two) times a week.     lisinopril (PRINIVIL,ZESTRIL) 20 MG tablet Take 20 mg by mouth daily.     Multiple Vitamin (MULTIVITAMIN) tablet Take 1 tablet by mouth daily.     TRULICITY 1.5 MV/6.7MC SOPN Inject into the skin.     JANUVIA 50 MG tablet Take 50 mg by mouth daily. (Patient not taking: Reported on 10/24/2020)     rosuvastatin (CRESTOR) 20 MG tablet Take 20 mg by mouth at bedtime. (Patient not taking: Reported on 10/24/2020)      No current facility-administered medications for this visit.    Past Medical History:  Diagnosis Date   Aneurysm ([place])    Cancer ([place])    Diabetes mellitus without complication ([place])    Hypertension    Renal disorder     Past Surgical History:  Procedure Laterality Date   HERNIA REPAIR     JOINT REPLACEMENT     TOTAL HIP REVISION       Social History   Tobacco Use   Smoking status: Every Day    Types: Cigars   Smokeless tobacco: Never   Tobacco comments:    1 cigar per day  Vaping Use   Vaping Use: Never used  Substance Use Topics   Alcohol use: Yes    Alcohol/week: 4.0 - 6.0 standard drinks    Types: 4 - 6 Cans of beer per week   Drug use: No      Family History  Problem Relation Age of Onset   Hyperlipidemia Mother    Hypertension Mother    Diabetes Mother    Hypertension Father    Heart disease Father    Diabetes Brother      Allergies  Allergen Reactions   Metformin Diarrhea   Sulfa Antibiotics Nausea And Vomiting and Other (See Comments)    Patient  states not allergic   Varicella Virus Vaccine Live Other (See Comments)    Cramps and weakness   Zoster Vac Recomb Adjuvanted     REVIEW OF SYSTEMS (Negative unless checked)   Constitutional: [] Weight loss  [] Fever  [] Chills Cardiac: [] Chest pain   [] Chest pressure   [] Palpitations   [] Shortness of breath when laying flat   [] Shortness of breath at rest   [] Shortness of breath with exertion. Vascular:  [] Pain in legs with walking   [] Pain in legs at rest   [] Pain in legs when laying flat   [] Claudication   [] Pain in feet when walking  [] Pain in feet at rest  [] Pain in feet when laying flat   [] History of DVT   [] Phlebitis   [] Swelling in legs   [] Varicose veins   [] Non-healing ulcers Pulmonary:   [] Uses home oxygen   [] Productive cough   [] Hemoptysis   [] Wheeze  [] COPD   [] Asthma Neurologic:  [] Dizziness  [] Blackouts   [] Seizures   [] History of stroke   [] History of TIA  [] Aphasia   [] Temporary  blindness   [] Dysphagia   [] Weakness or numbness in arms   [] Weakness or numbness in legs Musculoskeletal:  [x] Arthritis   [] Joint swelling   [x] Joint pain   [x] Low back pain Hematologic:  [] Easy bruising  [] Easy bleeding   [] Hypercoagulable state   [] Anemic   Gastrointestinal:  [] Blood in stool   [] Vomiting blood  [] Gastroesophageal reflux/heartburn   [] Abdominal pain Genitourinary:  [] Chronic kidney disease   [] Difficult urination  [x] Frequent urination  [] Burning with urination   [] Hematuria Skin:  [] Rashes   [] Ulcers   [] Wounds Psychological:  [] History of anxiety   []  History of major depression.  Physical Examination  BP 133/79 (BP Location: Right Arm)   Pulse 77   Resp 16   Wt 248 lb (112.5 kg)   BMI 31.00 kg/m  Gen:  WD/WN, NAD Head: [place]/AT, No temporalis wasting. Ear/Nose/Throat: Hearing grossly intact, nares w/o erythema or drainage Eyes: Conjunctiva clear. Sclera non-icteric Neck: Supple.  Trachea midline Pulmonary:  Good air movement, no use of accessory muscles.  Cardiac: RRR, no JVD Vascular:  Vessel Right Left  Radial Palpable Palpable                          PT Palpable Palpable  DP Palpable Palpable   Gastrointestinal: soft, non-tender/non-distended. No guarding/reflex.  Musculoskeletal: M/S 5/5 throughout.  No deformity or atrophy. No significant LE edema. Neurologic: Sensation grossly intact in extremities.  Symmetrical.  Speech is fluent.  Psychiatric: Judgment intact, Mood & affect appropriate for pt's clinical situation. Dermatologic: No rashes or ulcers noted.  No cellulitis or open wounds.      Labs No results found for this or any previous visit (from the past 2160 hour(s)).  Radiology No results found.  Assessment/Plan HTN (hypertension) blood pressure control important in reducing the progression of atherosclerotic disease. On appropriate oral medications.     Diabetes ([place]) blood glucose control important in reducing the progression  of atherosclerotic disease. Also, involved in wound healing. On appropriate medications.     Intracranial aneurysm Quite large.  Has seen neurosurgery recently and was stable.  Has opted for no repair at this time.  Had further follow-up from this with his recent skin cancer treatments and had scans, and these appear to remain stable per his report and he still plans on no surgery.  Iliac artery aneurysm (HCC)   His duplex today  does show some more enlargement of his aorta than the last study but still just over 4 cm in diameter.  The stent graft is widely patent and there is no endoleak.  The iliac arteries are less than 2 cm.  We will continue to follow this on an annual basis.    Leotis Pain, MD  10/24/2020 9:40 AM    This note was created with Dragon medical transcription system.  Any errors from dictation are purely unintentional

## 2020-10-24 NOTE — Assessment & Plan Note (Signed)
His duplex today does show some more enlargement of his aorta than the last study but still just over 4 cm in diameter.  The stent graft is widely patent and there is no endoleak.  The iliac arteries are less than 2 cm.  We will continue to follow this on an annual basis.

## 2021-10-23 ENCOUNTER — Ambulatory Visit (INDEPENDENT_AMBULATORY_CARE_PROVIDER_SITE_OTHER): Payer: Medicare HMO | Admitting: Vascular Surgery

## 2021-10-23 ENCOUNTER — Ambulatory Visit (INDEPENDENT_AMBULATORY_CARE_PROVIDER_SITE_OTHER): Payer: Medicare HMO

## 2021-10-23 ENCOUNTER — Encounter (INDEPENDENT_AMBULATORY_CARE_PROVIDER_SITE_OTHER): Payer: Self-pay | Admitting: Vascular Surgery

## 2021-10-23 VITALS — BP 153/80 | HR 63 | Resp 16 | Wt 249.8 lb

## 2021-10-23 DIAGNOSIS — I671 Cerebral aneurysm, nonruptured: Secondary | ICD-10-CM | POA: Diagnosis not present

## 2021-10-23 DIAGNOSIS — E119 Type 2 diabetes mellitus without complications: Secondary | ICD-10-CM

## 2021-10-23 DIAGNOSIS — I723 Aneurysm of iliac artery: Secondary | ICD-10-CM | POA: Diagnosis not present

## 2021-10-23 DIAGNOSIS — I10 Essential (primary) hypertension: Secondary | ICD-10-CM

## 2021-10-23 NOTE — Progress Notes (Signed)
MRN : [id number]  Scott Buckley is a 81 y.o. (1940/06/09) male who presents with chief complaint of  Chief Complaint  Patient presents with   Follow-up    Ultrasound follow up  .  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  He had a ruptured iliac artery aneurysm that was repaired about 7-1/2 years ago now.  He is doing well and has no current aneurysm related symptoms.  He also follows with neurosurgery for an intracranial aneurysm that is being monitored and managed nonoperatively.  His duplex today shows stable aortic diameter of approximately 4.1 cm and iliac artery diameters of 1.6 to 1.7 cm.  The stent graft is widely patent and there is no endoleak apparent.  Current Outpatient Medications  Medication Sig Dispense Refill   acetaminophen (TYLENOL) 500 MG tablet Take 500 mg by mouth every 6 (six) hours as needed.     Cholecalciferol (VITAMIN D3) 2000 units capsule Take 2,000 Units by mouth daily.      cyanocobalamin (,VITAMIN B-12,) 1000 MCG/ML injection Inject 1,000 mcg into the muscle every 30 (thirty) days.     hydrochlorothiazide (HYDRODIURIL) 25 MG tablet Take 25 mg by mouth daily.     ketoconazole (NIZORAL) 2 % shampoo Apply 1 application topically 2 (two) times a week.     lisinopril (PRINIVIL,ZESTRIL) 20 MG tablet Take 20 mg by mouth daily.     Multiple Vitamin (MULTIVITAMIN) tablet Take 1 tablet by mouth daily.     TRULICITY 1.5 JK/0.9FG SOPN Inject into the skin.     aspirin EC 81 MG tablet Take 81 mg by mouth daily. (Patient not taking: Reported on 10/23/2021)     glimepiride (AMARYL) 4 MG tablet Take 4 mg by mouth 2 (two) times daily.     JANUVIA 50 MG tablet Take 50 mg by mouth daily. (Patient not taking: Reported on 10/24/2020)     rosuvastatin (CRESTOR) 20 MG tablet Take 20 mg by mouth at bedtime. (Patient not taking: Reported on 10/24/2020)     No current facility-administered medications for this visit.    Past  Medical History:  Diagnosis Date   Aneurysm ([place])    Cancer ([place])    Diabetes mellitus without complication ([place])    Hypertension    Renal disorder     Past Surgical History:  Procedure Laterality Date   HERNIA REPAIR     JOINT REPLACEMENT     TOTAL HIP REVISION       Social History   Tobacco Use   Smoking status: Every Day    Types: Cigars   Smokeless tobacco: Never   Tobacco comments:    1 cigar per day  Vaping Use   Vaping Use: Never used  Substance Use Topics   Alcohol use: Yes    Alcohol/week: 4.0 - 6.0 standard drinks of alcohol    Types: 4 - 6 Cans of beer per week   Drug use: No      Family History  Problem Relation Age of Onset   Hyperlipidemia Mother    Hypertension Mother    Diabetes Mother    Hypertension Father    Heart disease Father    Diabetes Brother      Allergies  Allergen Reactions   Metformin Diarrhea   Sulfa Antibiotics Nausea And Vomiting and Other (See Comments)    Patient states not allergic   Varicella Virus Vaccine Live Other (See Comments)    Cramps  and weakness   Zoster Vac Recomb Adjuvanted     REVIEW OF SYSTEMS (Negative unless checked)   Constitutional: '[]'$ Weight loss  '[]'$ Fever  '[]'$ Chills Cardiac: '[]'$ Chest pain   '[]'$ Chest pressure   '[]'$ Palpitations   '[]'$ Shortness of breath when laying flat   '[]'$ Shortness of breath at rest   '[]'$ Shortness of breath with exertion. Vascular:  '[]'$ Pain in legs with walking   '[]'$ Pain in legs at rest   '[]'$ Pain in legs when laying flat   '[]'$ Claudication   '[]'$ Pain in feet when walking  '[]'$ Pain in feet at rest  '[]'$ Pain in feet when laying flat   '[]'$ History of DVT   '[]'$ Phlebitis   '[]'$ Swelling in legs   '[]'$ Varicose veins   '[]'$ Non-healing ulcers Pulmonary:   '[]'$ Uses home oxygen   '[]'$ Productive cough   '[]'$ Hemoptysis   '[]'$ Wheeze  '[]'$ COPD   '[]'$ Asthma Neurologic:  '[]'$ Dizziness  '[]'$ Blackouts   '[]'$ Seizures   '[]'$ History of stroke   '[]'$ History of TIA  '[]'$ Aphasia   '[]'$ Temporary blindness   '[]'$ Dysphagia   '[]'$ Weakness or numbness in arms    '[]'$ Weakness or numbness in legs Musculoskeletal:  '[x]'$ Arthritis   '[]'$ Joint swelling   '[x]'$ Joint pain   '[x]'$ Low back pain Hematologic:  '[]'$ Easy bruising  '[]'$ Easy bleeding   '[]'$ Hypercoagulable state   '[]'$ Anemic   Gastrointestinal:  '[]'$ Blood in stool   '[]'$ Vomiting blood  '[]'$ Gastroesophageal reflux/heartburn   '[]'$ Abdominal pain Genitourinary:  '[]'$ Chronic kidney disease   '[]'$ Difficult urination  '[x]'$ Frequent urination  '[]'$ Burning with urination   '[]'$ Hematuria Skin:  '[]'$ Rashes   '[]'$ Ulcers   '[]'$ Wounds Psychological:  '[]'$ History of anxiety   '[]'$  History of major depression.   Physical Examination  BP (!) 153/80 (BP Location: Right Arm)   Pulse 63   Resp 16   Wt 249 lb 12.8 oz (113.3 kg)   BMI 31.22 kg/m  Gen:  WD/WN, NAD Head: [place]/AT, No temporalis wasting. Ear/Nose/Throat: Hearing grossly intact, nares w/o erythema or drainage Eyes: Conjunctiva clear. Sclera non-icteric Neck: Supple.  Trachea midline Pulmonary:  Good air movement, no use of accessory muscles.  Cardiac: RRR, no JVD Vascular:  Vessel Right Left  Radial Palpable Palpable                                   Gastrointestinal: soft, non-tender/non-distended. No guarding/reflex.  Musculoskeletal: M/S 5/5 throughout.  No deformity or atrophy. Walking with a cane. Arthritic changes. Mild LE edema. Neurologic: Sensation grossly intact in extremities.  Symmetrical.  Speech is fluent.  Psychiatric: Judgment intact, Mood & affect appropriate for pt's clinical situation. Dermatologic: No rashes or ulcers noted.  No cellulitis or open wounds.      Labs No results found for this or any previous visit (from the past 2160 hour(s)).  Radiology No results found.  Assessment/Plan HTN (hypertension) blood pressure control important in reducing the progression of atherosclerotic disease. On appropriate oral medications.     Diabetes ([place]) blood glucose control important in reducing the progression of atherosclerotic disease. Also, involved in  wound healing. On appropriate medications.  Intracranial aneurysm Follows with neurosurgery  Iliac artery aneurysm (HCC) His duplex today shows stable aortic diameter of approximately 4.1 cm and iliac artery diameters of 1.6 to 1.7 cm.  The stent graft is widely patent and there is no endoleak apparent.  We will continue to follow this on an annual basis with duplex.  Contact our office with any problems in the interim.    Leotis Pain, MD  10/23/2021 9:21 AM    This note was created with Dragon medical transcription system.  Any errors from dictation are purely unintentional

## 2021-10-23 NOTE — Assessment & Plan Note (Signed)
His duplex today shows stable aortic diameter of approximately 4.1 cm and iliac artery diameters of 1.6 to 1.7 cm.  The stent graft is widely patent and there is no endoleak apparent.  We will continue to follow this on an annual basis with duplex.  Contact our office with any problems in the interim.

## 2021-10-23 NOTE — Assessment & Plan Note (Signed)
Follows with neurosurgery

## 2022-01-08 ENCOUNTER — Ambulatory Visit
Admission: RE | Admit: 2022-01-08 | Discharge: 2022-01-08 | Disposition: A | Discharge status: Home / Self Care | Payer: Medicare HMO | Source: Ambulatory Visit | Attending: Family Medicine | Admitting: Family Medicine

## 2022-01-08 ENCOUNTER — Other Ambulatory Visit: Payer: Self-pay | Admitting: Family Medicine

## 2022-01-08 DIAGNOSIS — G8929 Other chronic pain: Secondary | ICD-10-CM | POA: Diagnosis present

## 2022-01-08 DIAGNOSIS — R32 Unspecified urinary incontinence: Secondary | ICD-10-CM

## 2022-01-08 DIAGNOSIS — R29898 Other symptoms and signs involving the musculoskeletal system: Secondary | ICD-10-CM

## 2022-01-08 DIAGNOSIS — Z8546 Personal history of malignant neoplasm of prostate: Secondary | ICD-10-CM | POA: Diagnosis present

## 2022-01-08 DIAGNOSIS — M545 Low back pain, unspecified: Secondary | ICD-10-CM

## 2022-01-09 ENCOUNTER — Other Ambulatory Visit: Payer: Self-pay | Admitting: Family Medicine

## 2022-01-09 ENCOUNTER — Ambulatory Visit
Admission: RE | Admit: 2022-01-09 | Discharge: 2022-01-09 | Disposition: A | Discharge status: Home / Self Care | Payer: Medicare HMO | Source: Ambulatory Visit | Attending: Family Medicine | Admitting: Family Medicine

## 2022-01-09 ENCOUNTER — Other Ambulatory Visit (HOSPITAL_COMMUNITY): Payer: Self-pay | Admitting: Family Medicine

## 2022-01-09 DIAGNOSIS — N1330 Unspecified hydronephrosis: Secondary | ICD-10-CM | POA: Diagnosis present

## 2022-01-17 ENCOUNTER — Encounter: Payer: Self-pay | Admitting: Urology

## 2022-01-17 ENCOUNTER — Ambulatory Visit (INDEPENDENT_AMBULATORY_CARE_PROVIDER_SITE_OTHER): Payer: Medicare HMO | Admitting: Urology

## 2022-01-17 VITALS — BP 178/120 | HR 89 | Ht 74.0 in | Wt 235.0 lb

## 2022-01-17 DIAGNOSIS — R339 Retention of urine, unspecified: Secondary | ICD-10-CM

## 2022-01-17 DIAGNOSIS — Z8546 Personal history of malignant neoplasm of prostate: Secondary | ICD-10-CM

## 2022-01-17 DIAGNOSIS — N1330 Unspecified hydronephrosis: Secondary | ICD-10-CM | POA: Diagnosis not present

## 2022-01-17 DIAGNOSIS — C61 Malignant neoplasm of prostate: Secondary | ICD-10-CM

## 2022-01-17 LAB — URINALYSIS, COMPLETE
Bilirubin, UA: NEGATIVE
Glucose, UA: NEGATIVE
Ketones, UA: NEGATIVE
Leukocytes,UA: NEGATIVE
Nitrite, UA: NEGATIVE
RBC, UA: NEGATIVE
Specific Gravity, UA: 1.01 (ref 1.005–1.030)
Urobilinogen, Ur: 0.2 mg/dL (ref 0.2–1.0)
pH, UA: 5 (ref 5.0–7.5)

## 2022-01-17 LAB — MICROSCOPIC EXAMINATION
Bacteria, UA: NONE SEEN
Epithelial Cells (non renal): NONE SEEN /hpf (ref 0–10)

## 2022-01-17 LAB — BLADDER SCAN AMB NON-IMAGING

## 2022-01-17 MED ORDER — TAMSULOSIN HCL 0.4 MG PO CAPS: 0.4000 mg | ORAL_CAPSULE | Freq: Every day | ORAL | 3 refills | Status: DC

## 2022-01-17 NOTE — Patient Instructions (Signed)
Acute Urinary Retention, Male  Acute urinary retention is a condition in which a person is unable to pass urine or can only pass a little urine. This condition can happen suddenly and last for a short time. If left untreated, it can become long-term (chronic) and result in kidney damage or other serious complications. What are the causes? This condition may be caused by: Obstruction or narrowing of the tube that drains the bladder (urethra). This may be caused by surgery, problems with nearby organs, or injury to the bladder or urethra. Problems with the nerves in the bladder. Tumors in the area of the pelvis, bladder, or urethra. Certain medicines. Bladder or urinary tract infection. Constipation. What increases the risk? This condition is more likely to develop in older men. As men age, their prostate may become larger and may start to press or squeeze on the bladder or the urethra. Other chronic health conditions can increase the risk of acute urinary retention. These include: Diseases such as multiple sclerosis. Spinal cord injuries. Diabetes. Degenerative cognitive conditions, such as delirium or dementia. Psychological conditions. A man may hold his urine due to trauma or because he does not want to use the bathroom. What are the signs or symptoms? Symptoms of this condition include: Trouble urinating. Pain in the lower abdomen. How is this diagnosed? This condition is diagnosed based on a physical exam and your medical history. You may also have other tests, including: An ultrasound of the bladder or kidneys or both. Blood tests. A urine analysis. Additional tests may be needed, such as a CT scan, MRI, and kidney or bladder function tests. How is this treated? Treatment for this condition may include: Medicines. Placing a thin, sterile tube (catheter) into the bladder to drain urine out of the body. This is called an indwelling urinary catheter. After it is inserted, the  catheter is held in place with a small balloon that is filled with sterile water. Urine drains from the catheter into a collection bag outside of the body. Behavioral therapy. Treatment for other conditions. If needed, you may be treated in the hospital for kidney function problems or to manage other complications. Follow these instructions at home: Medicines Take over-the-counter and prescription medicines only as told by your health care provider. Avoid certain medicines, such as decongestants, antihistamines, and some prescription medicines. Do not take any medicine unless your health care provider approves. If you were prescribed an antibiotic medicine, take it as told by your health care provider. Do not stop using the antibiotic even if you start to feel better. General instructions Do not use any products that contain nicotine or tobacco. These products include cigarettes, chewing tobacco, and vaping devices, such as e-cigarettes. If you need help quitting, ask your health care provider. Drink enough fluid to keep your urine pale yellow. If you have an indwelling urinary catheter, follow the instructions from your health care provider. Monitor any changes in your symptoms. Tell your health care provider about any changes. If instructed, monitor your blood pressure at home. Report changes as told by your health care provider. Keep all follow-up visits. This is important. Contact a health care provider if: You have uncomfortable bladder contractions that you cannot control (spasms). You leak urine with the spasms. Get help right away if: You have chills or a fever. You have blood in your urine. You have a catheter and the following happens: Your catheter stops draining urine. Your catheter falls out. Summary Acute urinary retention is a condition in   which a person is unable to pass urine or can only pass a little urine. If left untreated, this condition can result in kidney damage or  other serious complications. An enlarged prostate may cause this condition. As men age, their prostate gland may become larger and may press or squeeze on the bladder or the urethra. Treatment for this condition may include medicines and placement of an indwelling urinary catheter. Monitor any changes in your symptoms. Tell your health care provider about any changes. This information is not intended to replace advice given to you by your health care provider. Make sure you discuss any questions you have with your health care provider. Document Revised: 12/15/2019 Document Reviewed: 12/15/2019 Elsevier Patient Education  2023 Elsevier Inc.  

## 2022-01-17 NOTE — Progress Notes (Signed)
01/17/22 2:58 PM   Scott Buckley 08-Feb-1941 [id number]  CC: Urinary incontinence, bilateral hydronephrosis, history of prostate cancer, CKD  HPI: 81 year old male here with his wife today.  He is a bit of a challenging historian.  He reports worsening urinary symptoms over the last few weeks with frequency and incontinence.  He also reports some low right-sided back pain, but he thinks this could be from a fall in the shower.  Was seen by PCP and found to have UTI and treated with antibiotics, CT also performed that showed distended bladder with bilateral hydroureteronephrosis, prostate measured approximately 60 g, but limited by hip prosthesis.  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  He has had worsening renal function, with recent creatinine 1.9(eGFR 35) which has worsened from creatinine 1.3(eGFR 53) in November 2022.   PMH: Past Medical History:  Diagnosis Date   Aneurysm ([place])    Cancer ([place])    Diabetes mellitus without complication ([place])    Hypertension    Renal disorder     Surgical History: Past Surgical History:  Procedure Laterality Date   HERNIA REPAIR     JOINT REPLACEMENT     TOTAL HIP REVISION       Family History: Family History  Problem Relation Age of Onset   Hyperlipidemia Mother    Hypertension Mother    Diabetes Mother    Hypertension Father    Heart disease Father    Diabetes Brother     Social History:  reports that he has been smoking cigars. He has been exposed to tobacco smoke. He has never used smokeless tobacco. He reports current alcohol use of about 4.0 - 6.0 standard drinks of alcohol per week. He reports that he does not use drugs.  Physical Exam: BP (!) 178/120   Pulse 89   Ht '6\' 2"'  (1.88 m)   Wt 235 lb (106.6 kg)   BMI 30.17 kg/m    Constitutional:  Alert and oriented, No acute distress. Cardiovascular: No clubbing,  cyanosis, or edema. Respiratory: Normal respiratory effort, no increased work of breathing. GI: Abdomen is soft, nontender, nondistended, no abdominal masses  Laboratory Data: Reviewed  Pertinent Imaging: I have personally viewed and interpreted the CT abdomen pelvis without contrast dated 01/09/2022 showing bilateral hydroureteronephrosis down to a distended bladder, prostate evaluation limited by hip prosthesis  See procedure note for cystoscopy details  Assessment & Plan:   81 year old male with worsening renal function and creatinine of 1.9, urinary urgency and incontinence likely secondary to overflow incontinence, and CT showing bilateral hydroureteronephrosis down to the bladder.  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  We reviewed the importance of bladder decompression to relieve pressure on his kidneys and his worsening renal function and potentially improve his back pain, as well as his overflow incontinence.  Suspect BPH as etiology of his urinary retention.  Treatment options complicated by history of prostate cancer treated with radiation, and high risk for incontinence with any surgical intervention.  Foley catheter was placed today for bladder rest and Flomax started, and we will plan for voiding trial in 2 weeks.  Could consider HOLEP if persistent retention, but high risk for incontinence.  With his frailty  not likely a good candidate for intermittent catheterization.   I spent 65 total minutes on the day of the encounter including pre-visit review of the medical record, face-to-face time with the patient, and post visit ordering of labs/imaging/tests.  Nickolas Madrid, MD 01/17/2022  [facility] [address], [place], [place] [address] [phone]

## 2022-01-17 NOTE — Progress Notes (Addendum)
Cystoscopy Procedure Note:  Indication: Urinary retention  After informed consent and discussion of the procedure and its risks, Scott Buckley was positioned and prepped in the standard fashion. Cystoscopy was performed with a flexible cystoscope.  No evidence of urethral stricture.  The urethra, bladder neck and entire bladder was visualized in a standard fashion. The prostate was moderate in size with obstructing lateral lobes. The ureteral orifices were visualized in their normal location and orientation.  Moderate bladder trabeculations, significantly distended bladder, no abnormalities on retroflexion.  A sensor wire was passed into the bladder and the scope removed.  An 37 [place] council tip Foley catheter passed easily over the wire into the bladder with return of amber urine, 10 mL placed in the balloon and connected to drainage.  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

## 2022-01-25 ENCOUNTER — Telehealth: Payer: Self-pay

## 2022-01-25 ENCOUNTER — Inpatient Hospital Stay
Admission: EM | Admit: 2022-01-25 | Discharge: 2022-01-28 | DRG: 698 | Disposition: A | Discharge status: Home Health | Payer: Medicare HMO | Attending: Internal Medicine | Admitting: Internal Medicine

## 2022-01-25 DIAGNOSIS — I878 Other specified disorders of veins: Secondary | ICD-10-CM | POA: Diagnosis present

## 2022-01-25 DIAGNOSIS — N390 Urinary tract infection, site not specified: Secondary | ICD-10-CM | POA: Diagnosis present

## 2022-01-25 DIAGNOSIS — Z833 Family history of diabetes mellitus: Secondary | ICD-10-CM | POA: Diagnosis not present

## 2022-01-25 DIAGNOSIS — N39490 Overflow incontinence: Secondary | ICD-10-CM | POA: Diagnosis present

## 2022-01-25 DIAGNOSIS — N401 Enlarged prostate with lower urinary tract symptoms: Secondary | ICD-10-CM | POA: Diagnosis present

## 2022-01-25 DIAGNOSIS — D631 Anemia in chronic kidney disease: Secondary | ICD-10-CM | POA: Diagnosis present

## 2022-01-25 DIAGNOSIS — Z8546 Personal history of malignant neoplasm of prostate: Secondary | ICD-10-CM | POA: Diagnosis not present

## 2022-01-25 DIAGNOSIS — Y846 Urinary catheterization as the cause of abnormal reaction of the patient, or of later complication, without mention of misadventure at the time of the procedure: Secondary | ICD-10-CM | POA: Diagnosis present

## 2022-01-25 DIAGNOSIS — R339 Retention of urine, unspecified: Secondary | ICD-10-CM

## 2022-01-25 DIAGNOSIS — I723 Aneurysm of iliac artery: Secondary | ICD-10-CM | POA: Diagnosis present

## 2022-01-25 DIAGNOSIS — F17290 Nicotine dependence, other tobacco product, uncomplicated: Secondary | ICD-10-CM | POA: Diagnosis present

## 2022-01-25 DIAGNOSIS — H5340 Unspecified visual field defects: Secondary | ICD-10-CM | POA: Diagnosis present

## 2022-01-25 DIAGNOSIS — Z8249 Family history of ischemic heart disease and other diseases of the circulatory system: Secondary | ICD-10-CM

## 2022-01-25 DIAGNOSIS — R338 Other retention of urine: Secondary | ICD-10-CM | POA: Diagnosis not present

## 2022-01-25 DIAGNOSIS — Z7984 Long term (current) use of oral hypoglycemic drugs: Secondary | ICD-10-CM

## 2022-01-25 DIAGNOSIS — I10 Essential (primary) hypertension: Secondary | ICD-10-CM

## 2022-01-25 DIAGNOSIS — E1122 Type 2 diabetes mellitus with diabetic chronic kidney disease: Secondary | ICD-10-CM | POA: Diagnosis present

## 2022-01-25 DIAGNOSIS — N1330 Unspecified hydronephrosis: Secondary | ICD-10-CM | POA: Diagnosis present

## 2022-01-25 DIAGNOSIS — L89153 Pressure ulcer of sacral region, stage 3: Secondary | ICD-10-CM | POA: Diagnosis present

## 2022-01-25 DIAGNOSIS — R3915 Urgency of urination: Secondary | ICD-10-CM | POA: Diagnosis present

## 2022-01-25 DIAGNOSIS — E785 Hyperlipidemia, unspecified: Secondary | ICD-10-CM

## 2022-01-25 DIAGNOSIS — N400 Enlarged prostate without lower urinary tract symptoms: Secondary | ICD-10-CM

## 2022-01-25 DIAGNOSIS — T83511A Infection and inflammatory reaction due to indwelling urethral catheter, initial encounter: Principal | ICD-10-CM | POA: Diagnosis present

## 2022-01-25 DIAGNOSIS — Z87440 Personal history of urinary (tract) infections: Secondary | ICD-10-CM

## 2022-01-25 DIAGNOSIS — Z887 Allergy status to serum and vaccine status: Secondary | ICD-10-CM

## 2022-01-25 DIAGNOSIS — N1832 Chronic kidney disease, stage 3b: Secondary | ICD-10-CM | POA: Diagnosis present

## 2022-01-25 DIAGNOSIS — Z85828 Personal history of other malignant neoplasm of skin: Secondary | ICD-10-CM

## 2022-01-25 DIAGNOSIS — I129 Hypertensive chronic kidney disease with stage 1 through stage 4 chronic kidney disease, or unspecified chronic kidney disease: Secondary | ICD-10-CM | POA: Diagnosis present

## 2022-01-25 DIAGNOSIS — Z79899 Other long term (current) drug therapy: Secondary | ICD-10-CM

## 2022-01-25 DIAGNOSIS — Z7985 Long-term (current) use of injectable non-insulin antidiabetic drugs: Secondary | ICD-10-CM

## 2022-01-25 DIAGNOSIS — Z882 Allergy status to sulfonamides status: Secondary | ICD-10-CM | POA: Diagnosis not present

## 2022-01-25 DIAGNOSIS — R531 Weakness: Secondary | ICD-10-CM

## 2022-01-25 DIAGNOSIS — Z7982 Long term (current) use of aspirin: Secondary | ICD-10-CM

## 2022-01-25 DIAGNOSIS — L8990 Pressure ulcer of unspecified site, unspecified stage: Secondary | ICD-10-CM | POA: Insufficient documentation

## 2022-01-25 LAB — CBC WITH DIFFERENTIAL/PLATELET
Abs Immature Granulocytes: 0.06 10*3/uL (ref 0.00–0.07)
Basophils Absolute: 0 10*3/uL (ref 0.0–0.1)
Basophils Relative: 0 %
Eosinophils Absolute: 0.2 10*3/uL (ref 0.0–0.5)
Eosinophils Relative: 2 %
HCT: 38.2 % — ABNORMAL LOW (ref 39.0–52.0)
Hemoglobin: 12.1 g/dL — ABNORMAL LOW (ref 13.0–17.0)
Immature Granulocytes: 1 %
Lymphocytes Relative: 18 %
Lymphs Abs: 1.8 10*3/uL (ref 0.7–4.0)
MCH: 29.4 pg (ref 26.0–34.0)
MCHC: 31.7 g/dL (ref 30.0–36.0)
MCV: 92.7 fL (ref 80.0–100.0)
Monocytes Absolute: 1 10*3/uL (ref 0.1–1.0)
Monocytes Relative: 9 %
Neutro Abs: 7.2 10*3/uL (ref 1.7–7.7)
Neutrophils Relative %: 70 %
Platelets: 219 10*3/uL (ref 150–400)
RBC: 4.12 MIL/uL — ABNORMAL LOW (ref 4.22–5.81)
RDW: 13.5 % (ref 11.5–15.5)
WBC: 10.3 10*3/uL (ref 4.0–10.5)
nRBC: 0 % (ref 0.0–0.2)

## 2022-01-25 LAB — BASIC METABOLIC PANEL
Anion gap: 10 (ref 5–15)
BUN: 49 mg/dL — ABNORMAL HIGH (ref 8–23)
CO2: 27 mmol/L (ref 22–32)
Calcium: 9.4 mg/dL (ref 8.9–10.3)
Chloride: 98 mmol/L (ref 98–111)
Creatinine, Ser: 2 mg/dL — ABNORMAL HIGH (ref 0.61–1.24)
GFR, Estimated: 33 mL/min — ABNORMAL LOW (ref >60)
Glucose, Bld: 117 mg/dL — ABNORMAL HIGH (ref 70–99)
Potassium: 4.5 mmol/L (ref 3.5–5.1)
Sodium: 135 mmol/L (ref 135–145)

## 2022-01-25 LAB — URINALYSIS, ROUTINE W REFLEX MICROSCOPIC
Bacteria, UA: NONE SEEN
Bilirubin Urine: NEGATIVE
Glucose, UA: NEGATIVE mg/dL
Ketones, ur: NEGATIVE mg/dL
Nitrite: NEGATIVE
Protein, ur: >=300 mg/dL — AB
RBC / HPF: >50 RBC/hpf — ABNORMAL HIGH (ref 0–5)
Specific Gravity, Urine: 1.015 (ref 1.005–1.030)
WBC, UA: >50 WBC/hpf — ABNORMAL HIGH (ref 0–5)
pH: 5 (ref 5.0–8.0)

## 2022-01-25 LAB — TROPONIN I (HIGH SENSITIVITY): Troponin I (High Sensitivity): 15 ng/L (ref <18)

## 2022-01-25 LAB — LACTIC ACID, PLASMA
Lactic Acid, Venous: 1.6 mmol/L (ref 0.5–1.9)
Lactic Acid, Venous: 1.6 mmol/L (ref 0.5–1.9)

## 2022-01-25 LAB — CBG MONITORING, ED: Glucose-Capillary: 220 mg/dL — ABNORMAL HIGH (ref 70–99)

## 2022-01-25 MED ORDER — SODIUM CHLORIDE 0.9 % IV SOLN
1.0000 g | Freq: Once | INTRAVENOUS | Status: AC
  Administered 2022-01-25: 1 g via INTRAVENOUS
  Filled 2022-01-25: qty 10

## 2022-01-25 MED ORDER — LISINOPRIL 10 MG PO TABS: 20.0000 mg | ORAL_TABLET | Freq: Every day | ORAL | Status: DC

## 2022-01-25 MED ORDER — GLIMEPIRIDE 4 MG PO TABS
4.0000 mg | ORAL_TABLET | Freq: Two times a day (BID) | ORAL | Status: DC
  Administered 2022-01-25: 4 mg via ORAL
  Filled 2022-01-25 (×2): qty 1

## 2022-01-25 MED ORDER — ACETAMINOPHEN 650 MG RE SUPP: 650.0000 mg | Freq: Four times a day (QID) | RECTAL | Status: DC | PRN

## 2022-01-25 MED ORDER — INSULIN ASPART 100 UNIT/ML IJ SOLN
0.0000 [IU] | Freq: Every day | INTRAMUSCULAR | Status: DC
  Administered 2022-01-25: 2 [IU] via SUBCUTANEOUS
  Filled 2022-01-25: qty 1

## 2022-01-25 MED ORDER — INSULIN ASPART 100 UNIT/ML IJ SOLN
0.0000 [IU] | Freq: Three times a day (TID) | INTRAMUSCULAR | Status: DC
  Administered 2022-01-26: 2 [IU] via SUBCUTANEOUS
  Filled 2022-01-25: qty 1

## 2022-01-25 MED ORDER — SODIUM CHLORIDE 0.9 % IV SOLN: INTRAVENOUS | Status: DC

## 2022-01-25 MED ORDER — ONDANSETRON HCL 4 MG/2ML IJ SOLN: 4.0000 mg | Freq: Four times a day (QID) | INTRAMUSCULAR | Status: DC | PRN

## 2022-01-25 MED ORDER — HYDROCHLOROTHIAZIDE 25 MG PO TABS: 25.0000 mg | ORAL_TABLET | Freq: Every day | ORAL | Status: DC

## 2022-01-25 MED ORDER — SODIUM CHLORIDE 0.9 % IV SOLN
1.0000 g | INTRAVENOUS | Status: AC
  Administered 2022-01-26 – 2022-01-27 (×2): 1 g via INTRAVENOUS
  Filled 2022-01-25 (×2): qty 10

## 2022-01-25 MED ORDER — VITAMIN D 25 MCG (1000 UNIT) PO TABS
2000.0000 [IU] | ORAL_TABLET | Freq: Every day | ORAL | Status: DC
  Administered 2022-01-25 – 2022-01-27 (×3): 2000 [IU] via ORAL
  Filled 2022-01-25 (×3): qty 2

## 2022-01-25 MED ORDER — ENOXAPARIN SODIUM 40 MG/0.4ML IJ SOSY
40.0000 mg | PREFILLED_SYRINGE | INTRAMUSCULAR | Status: DC
  Administered 2022-01-25 – 2022-01-27 (×3): 40 mg via SUBCUTANEOUS
  Filled 2022-01-25 (×3): qty 0.4

## 2022-01-25 MED ORDER — TAMSULOSIN HCL 0.4 MG PO CAPS
0.4000 mg | ORAL_CAPSULE | Freq: Every day | ORAL | Status: DC
  Administered 2022-01-26 – 2022-01-27 (×2): 0.4 mg via ORAL
  Filled 2022-01-25 (×2): qty 1

## 2022-01-25 MED ORDER — TRAZODONE HCL 50 MG PO TABS
25.0000 mg | ORAL_TABLET | Freq: Every evening | ORAL | Status: DC | PRN
  Administered 2022-01-26: 25 mg via ORAL
  Filled 2022-01-25 (×2): qty 1

## 2022-01-25 MED ORDER — ONDANSETRON HCL 4 MG PO TABS: 4.0000 mg | ORAL_TABLET | Freq: Four times a day (QID) | ORAL | Status: DC | PRN

## 2022-01-25 MED ORDER — MAGNESIUM HYDROXIDE 400 MG/5ML PO SUSP: 30.0000 mL | Freq: Every day | ORAL | Status: DC | PRN

## 2022-01-25 MED ORDER — SODIUM CHLORIDE 0.9 % IV BOLUS
500.0000 mL | Freq: Once | INTRAVENOUS | Status: AC
  Administered 2022-01-25: 500 mL via INTRAVENOUS

## 2022-01-25 MED ORDER — CYANOCOBALAMIN 1000 MCG/ML IJ SOLN
1000.0000 ug | INTRAMUSCULAR | Status: DC
  Filled 2022-01-25: qty 1

## 2022-01-25 MED ORDER — ACETAMINOPHEN 325 MG PO TABS: 650.0000 mg | ORAL_TABLET | Freq: Four times a day (QID) | ORAL | Status: DC | PRN

## 2022-01-25 NOTE — Assessment & Plan Note (Signed)
--  A1c 6.0, well controlled.  Not on insulin at home --d/c BG checks and SSI --resume oral hypoglycemics agents after discharge

## 2022-01-25 NOTE — ED Notes (Signed)
Patient family left for the night. Patient was able to eat dinner with family. No needs at this time.

## 2022-01-25 NOTE — Telephone Encounter (Signed)
Spoke with patient's wife about how pt is doing? Per wife pt is really weak and dehydrated and she was asking if Dr. Diamantina Providence could admit him for fluids? Wife states she had to call EMS to get pt in their home yesterday. Pt is doing a little better today, pt will not drink water and his urine was dark. Please advise, wife is scared she is going to loose him.  ( See care everywhere at PCP message)

## 2022-01-25 NOTE — Assessment & Plan Note (Signed)
--  hold home HCTZ and Lisinopril since there was concern for dehydration --cont NS'@75'$  for 12 hours

## 2022-01-25 NOTE — Assessment & Plan Note (Signed)
--  Pt denied any urinary symptoms, and UA and urine cx obtained on presentation was obtained from his pre-existing Foley which was placed on 01/17/22, which was likely to be colonized with bacteria.   --pt was started on ceftriaxone on presentation Plan: --cont ceftriaxone for now --f/u urine cx results

## 2022-01-25 NOTE — Telephone Encounter (Signed)
Spoke with wife and she will call EMS to have him transported to the ER for weakness and dehydration. She just thought they could take him straight to the hospital for a bed. I explained we don't do direct admits, she understood.   ( I feel so bad for them)

## 2022-01-25 NOTE — ED Notes (Addendum)
Positioned on left side - monitors in place.  Wound noted to inner bottom - mepilex applied. MD at bedside for evaluation.

## 2022-01-25 NOTE — Assessment & Plan Note (Signed)
--  initially thought to be due to UTI --PT eval, rec home with [facility]

## 2022-01-25 NOTE — Assessment & Plan Note (Addendum)
-   We will continue Flomax 

## 2022-01-25 NOTE — Telephone Encounter (Signed)
Left message on triage line [id number]  Scott Buckley states pt is weak. He has lost a gallon of urine. He needs to be admitted in the hospital for fluids so he can be brought back to life. They called 911 yesterday to help him get from the garage to the house.   Called and spoke to the pt He states he feels good. He is not having any issues with his catheter- no leaking, no blood, no fever. His urine is a lite yellow. He is pushing fluids.   Scott Buckley gets on the phone and states she spoke to [place] and Tennis Must was going to send a message to  [place]. She states Scott Buckley is feeling weak and needs fluid. She wants [place] to admit him. Pt aware we do not admit.   Discussed with Morey Hummingbird about office protocol and due to pt not having any catheter/ urinary  issues he needs to be seen in the ED. Tennis Must spoke with pt and sent Diamantina Providence a message however he is in the OR. She confirmed Diamantina Providence would be agreeable for pt to be seen in ED.  Called Scott Buckley back and advised pt needs to be seen in ed for weakness. De called pt and stated the same. Pt voiced understanding.

## 2022-01-25 NOTE — Assessment & Plan Note (Signed)
-   This is fairly stable. - The patient will be hydrated with IV normal saline. - We will continue/Toradol.

## 2022-01-25 NOTE — Progress Notes (Signed)
   01/25/22 1800  Clinical Encounter Type  Visited With Patient and family together  Visit Type Initial;ED  Spiritual Encounters  Spiritual Needs Prayer   Chaplain visited while rounding in ED and provided support and comfort for family.

## 2022-01-25 NOTE — Assessment & Plan Note (Signed)
--  resume statin after discharge

## 2022-01-25 NOTE — ED Provider Notes (Signed)
[facility] Provider Note    Event Date/Time   First MD Initiated Contact with Patient 01/25/22 1730     (approximate)   History   Weakness  HPI  BRYDEN DARDEN is a 81 y.o. male  who presents to the emergency department today because of concern for weakness. History is also obtained from wife at bedside. States that the patient will have some waxing and waning levels of weakness. For example two days ago he was very weak and not able to get out of the bed. Today the weakness is not as severe and the wife feels the patient has more color to him. Did recently have a catheter placed and finished course of antibiotics for UTI. The patient denies any chest pain or palpitations when he has the significant weakness.      Physical Exam   Triage Vital Signs: ED Triage Vitals  Enc Vitals Group     BP 01/25/22 1418 99/65     Pulse Rate 01/25/22 1418 100     Resp 01/25/22 1418 16     Temp 01/25/22 1418 98.3 F (36.8 C)     Temp Source 01/25/22 1535 Oral     SpO2 01/25/22 1418 94 %     Weight 01/25/22 1421 235 lb (106.6 kg)     Height 01/25/22 1421 '6\' 3"'$  (1.905 m)     Head Circumference --      Peak Flow --      Pain Score 01/25/22 1433 0     Pain Loc --      Pain Edu? --      Excl. in [place]? --     Most recent vital signs: Vitals:   01/25/22 1418 01/25/22 1535  BP: 99/65 101/68  Pulse: 100 94  Resp: 16 16  Temp: 98.3 F (36.8 C) 98.5 F (36.9 C)  SpO2: 94% 96%   General: Awake, alert, oriented. CV:  Good peripheral perfusion. Regular rate and rhythm. Resp:  Normal effort. Lungs clear. Abd:  No distention.   ED Results / Procedures / Treatments   Labs (all labs ordered are listed, but only abnormal results are displayed) Labs Reviewed  CBC WITH DIFFERENTIAL/PLATELET - Abnormal; Notable for the following components:      Result Value   RBC 4.12 (*)    Hemoglobin 12.1 (*)    HCT 38.2 (*)    All other components within normal limits  BASIC  METABOLIC PANEL - Abnormal; Notable for the following components:   Glucose, Bld 117 (*)    BUN 49 (*)    Creatinine, Ser 2.00 (*)    GFR, Estimated 33 (*)    All other components within normal limits  URINALYSIS, ROUTINE W REFLEX MICROSCOPIC - Abnormal; Notable for the following components:   Color, Urine AMBER (*)    APPearance CLOUDY (*)    Hgb urine dipstick LARGE (*)    Protein, ur >=300 (*)    Leukocytes,Ua TRACE (*)    RBC / HPF >50 (*)    WBC, UA >50 (*)    All other components within normal limits  CULTURE, BLOOD (ROUTINE X 2)  CULTURE, BLOOD (ROUTINE X 2)  URINE CULTURE  LACTIC ACID, PLASMA  LACTIC ACID, PLASMA  CBC  CREATININE, SERUM  BASIC METABOLIC PANEL  CBC  TROPONIN I (HIGH SENSITIVITY)     EKG  None   RADIOLOGY None   PROCEDURES:  Critical Care performed: No  Procedures   MEDICATIONS ORDERED  IN ED: Medications - No data to display   IMPRESSION / MDM / [place] / ED COURSE  I reviewed the triage vital signs and the nursing notes.                              Differential diagnosis includes, but is not limited to, anemia, dehydration, UTI.  Patient's presentation is most consistent with acute presentation with potential threat to life or bodily function.  Patient presented to the emergency department today because of concern for weakness.Recent catheter placement by urology. Blood work without significant anemia. Creatine is elevated however not significantly different from recent value of 1.9 found in care everywhere. UA is concerning for continued infection. Did order IV abx. Discussed with Dr. Sidney Ace with the hospitalist service who will plan on admission.  FINAL CLINICAL IMPRESSION(S) / ED DIAGNOSES   Final diagnoses:  Weakness  Lower urinary tract infectious disease     Note:  This document was prepared using Dragon voice recognition software and may include unintentional dictation errors.    Nance Pear,  MD 01/25/22 2053

## 2022-01-25 NOTE — ED Triage Notes (Signed)
First Nurse Note:  Per EMS Patient has an Foley catheter from approximately one week ago for a bladder infection. Patient finished ABX and is on Flomax. Wife reports patient has had a poor fluid intake this week and patient c/o weakness. Per EMS, patient was positive for orthostatics vital signs:  117/68 sitting 97/59 standing  Pulse is 86, sinus with RBB and pvcs RR 20  96% on room air  CBG 178.

## 2022-01-25 NOTE — H&P (Addendum)
[place]   PATIENT NAME: Scott Buckley    MR#:  [id number]  DATE OF BIRTH:  [birth year]  DATE OF ADMISSION:  01/25/2022  PRIMARY CARE PHYSICIAN: Donnamarie Rossetti, PA-C   Patient is coming from: Home  REQUESTING/REFERRING PHYSICIAN: Nance Pear, MD  CHIEF COMPLAINT:   Chief Complaint  Patient presents with   Weakness    Patient advised to come to the ED by Karolee Ohs (urology) to be evaluated for increasing weakness and fatigue (see note in EMR); Patient did have indwelling catheter placed on 10/12 for acute urinary retention; Patient has not been able to stand or ambulate on his own like he normally does; Denies pain, only reports weakness and fatigue    HISTORY OF PRESENT ILLNESS:  Scott Buckley is a 81 y.o. male with medical history significant for type 2 diabetes mellitus, hypertension, stage IIIb chronic kidney disease, who presented to the emergency room with acute onset of generalized weakness.  This has been waxing and waning lately.  According to his wife he was unable to get out of breath a couple days ago.  2 days slightly better but still feeling weak.  He had a Foley catheter placed recently about a couple weeks ago and was recently treated for UTI with a course of antibiotics that he finished.  No cough or wheezing or dyspnea.  No fever or chills.  No nausea or vomiting or abdominal pain.  He admits to mild urinary urgency.  No dysuria, oliguria, urinary frequency or flank pain.  No bleeding diathesis.  ED Course: When he came to the ER, BP was 99/65 and later 101/68 with otherwise normal vital signs.  Labs revealed BUN of 49 and a creatinine of 2 comparable to previous levels 4 years ago.  High sensitive troponin was 15 and lactic acid was 1.6 and later the same.  CBC showed mild anemia and UA came back positive for UTI.  Blood cultures were drawn. EKG as reviewed by me : None Imaging: None.  The patient was given a gram of IV Rocephin.  He will be  admitted to a medical bed for further evaluation and management PAST MEDICAL HISTORY:   Past Medical History:  Diagnosis Date   Aneurysm ([place])    Cancer ([place])    Diabetes mellitus without complication ([place])    Hypertension    Renal disorder     PAST SURGICAL HISTORY:   Past Surgical History:  Procedure Laterality Date   HERNIA REPAIR     JOINT REPLACEMENT     TOTAL HIP REVISION      SOCIAL HISTORY:   Social History   Tobacco Use   Smoking status: Every Day    Types: Cigars    Passive exposure: Current   Smokeless tobacco: Never   Tobacco comments:    1 cigar per day  Substance Use Topics   Alcohol use: Yes    Alcohol/week: 4.0 - 6.0 standard drinks of alcohol    Types: 4 - 6 Cans of beer per week    FAMILY HISTORY:   Family History  Problem Relation Age of Onset   Hyperlipidemia Mother    Hypertension Mother    Diabetes Mother    Hypertension Father    Heart disease Father    Diabetes Brother     DRUG ALLERGIES:   Allergies  Allergen Reactions   Metformin Diarrhea   Sulfa Antibiotics Nausea And Vomiting and Other (See Comments)    Patient  states not allergic   Varicella Virus Vaccine Live Other (See Comments)    Cramps and weakness   Zoster Vac Recomb Adjuvanted     Other reaction(s): Not available    REVIEW OF SYSTEMS:   ROS As per history of present illness. All pertinent systems were reviewed above. Constitutional, HEENT, cardiovascular, respiratory, GI, GU, musculoskeletal, neuro, psychiatric, endocrine, integumentary and hematologic systems were reviewed and are otherwise negative/unremarkable except for positive findings mentioned above in the HPI.   MEDICATIONS AT HOME:   Prior to Admission medications   Medication Sig Start Date End Date Taking? Authorizing Provider  acetaminophen (TYLENOL) 650 MG CR tablet Take by mouth.   Yes [provider]  Cholecalciferol (VITAMIN D3) 2000 units capsule Take 2,000 Units by mouth daily.     Yes [provider]  cyanocobalamin (,VITAMIN B-12,) 1000 MCG/ML injection Inject 1,000 mcg into the muscle every 30 (thirty) days. 04/06/18  Yes [provider]  glimepiride (AMARYL) 4 MG tablet Take 4 mg by mouth 2 (two) times daily. 05/27/17 01/25/22 Yes [provider]  hydrochlorothiazide (HYDRODIURIL) 25 MG tablet Take 25 mg by mouth daily.   Yes [provider]  lisinopril (PRINIVIL,ZESTRIL) 20 MG tablet Take 20 mg by mouth daily.   Yes [provider]  Multiple Vitamin (MULTIVITAMIN) tablet Take 1 tablet by mouth daily.   Yes [provider]  tamsulosin (FLOMAX) 0.4 MG CAPS capsule Take 1 capsule (0.4 mg total) by mouth daily after supper. 01/17/22  Yes Billey Co, MD  aspirin EC 81 MG tablet Take 81 mg by mouth daily. Patient not taking: Reported on 01/25/2022    [provider]  doxycycline (VIBRAMYCIN) 100 MG capsule Take 100 mg by mouth 2 (two) times daily. Patient not taking: Reported on 01/25/2022 01/11/22   [provider]  Dulaglutide 3 MG/0.5ML SOPN Inject into the skin. 11/06/21   [provider]  JANUVIA 50 MG tablet Take 50 mg by mouth daily. Patient not taking: Reported on 01/25/2022 10/28/19   [provider]  ketoconazole (NIZORAL) 2 % shampoo Apply 1 application topically 2 (two) times a week. Patient not taking: Reported on 01/25/2022 08/20/19   [provider]  rosuvastatin (CRESTOR) 20 MG tablet Take 20 mg by mouth at bedtime. Patient not taking: Reported on 01/25/2022 07/26/19   [provider]      VITAL SIGNS:  Blood pressure 121/76, pulse 85, temperature 98.5 F (36.9 C), temperature source Oral, resp. rate 14, height '6\' 3"'$  (1.905 m), weight 106.6 kg, SpO2 98 %.  PHYSICAL EXAMINATION:  Physical Exam  GENERAL:  81 y.o.-year-old Caucasian patient lying in the bed with no acute distress.  EYES: Pupils equal, round, reactive to light and accommodation. No  scleral icterus. Extraocular muscles intact.  HEENT: Head atraumatic, normocephalic. Oropharynx and nasopharynx clear.  NECK:  Supple, no jugular venous distention. No thyroid enlargement, no tenderness.  LUNGS: Normal breath sounds bilaterally, no wheezing, rales,rhonchi or crepitation. No use of accessory muscles of respiration.  CARDIOVASCULAR: Regular rate and rhythm, S1, S2 normal. No murmurs, rubs, or gallops.  ABDOMEN: Soft, nondistended, nontender. Bowel sounds present. No organomegaly or mass.  EXTREMITIES: No pedal edema, cyanosis, or clubbing. GU: Foley catheter is intact with purulent urine outflow.   NEUROLOGIC: Cranial nerves II through XII are intact. Muscle strength 5/5 in all extremities. Sensation intact. Gait not checked.  PSYCHIATRIC: The patient is alert and oriented x 3.  Normal affect and good eye contact. SKIN: No  obvious rash, lesion, or ulcer.   LABORATORY PANEL:   CBC Recent Labs  Lab 01/25/22 1441  WBC 10.3  HGB 12.1*  HCT 38.2*  PLT 219   ------------------------------------------------------------------------------------------------------------------  Chemistries  Recent Labs  Lab 01/25/22 1441  NA 135  K 4.5  CL 98  CO2 27  GLUCOSE 117*  BUN 49*  CREATININE 2.00*  CALCIUM 9.4   ------------------------------------------------------------------------------------------------------------------  Cardiac Enzymes No results for input(s): "TROPONINI" in the last 168 hours. ------------------------------------------------------------------------------------------------------------------  RADIOLOGY:  No results found.    IMPRESSION AND PLAN:  Assessment and Plan: * Acute lower UTI - Patient will be admitted to a medical bed. - We will continue antibiotic therapy with IV Rocephin. - We will follow blood and urine cultures. - This is likely the main culprit for his generalized weakness. - We will keep Foley catheter till a.m. and consider  replacing it then with availability of urology backup.  Generalized weakness - This is likely secondary to #1. - PT consult will be obtained. - Management otherwise as above.  Type 2 diabetes mellitus with chronic kidney disease, without long-term current use of insulin ([place]) - The patient will be placed on supplemental coverage with NovoLog. - We will continue his Amaryl and Januvia.  Essential hypertension - We will continue his antihypertensives.  Chronic kidney disease, stage 3b ([place]) - This is fairly stable. - The patient will be hydrated with IV normal saline. - We will continue/Toradol.  BPH (benign prostatic hyperplasia) - We will continue Flomax.  Dyslipidemia - We will continue statin therapy.       DVT prophylaxis: Lovenox. Advanced Care Planning:  Code Status: full code. Family Communication:  The plan of care was discussed in details with the patient (and family). I answered all questions. The patient agreed to proceed with the above mentioned plan. Further management will depend upon hospital course. Disposition Plan: Back to previous home environment Consults called: none. All the records are reviewed and case discussed with ED provider.  Status is: Inpatient    At the time of the admission, it appears that the appropriate admission status for this patient is inpatient.  This is judged to be reasonable and necessary in order to provide the required intensity of service to ensure the patient's safety given the presenting symptoms, physical exam findings and initial radiographic and laboratory data in the context of comorbid conditions.  The patient requires inpatient status due to high intensity of service, high risk of further deterioration and high frequency of surveillance required.  I certify that at the time of admission, it is my clinical judgment that the patient will require inpatient hospital care extending more than 2 midnights.                             Dispo: The patient is from: Home              Anticipated d/c is to: Home              Patient currently is not medically stable to d/c.              Difficult to place patient: No  Christel Mormon M.D on 01/25/2022 at 9:15 PM  [facility]   From 7 PM-7 AM, contact night-coverage [email or web address]  CC: Primary care physician; [place], [employer], PA-C

## 2022-01-25 NOTE — ED Provider Triage Note (Signed)
Emergency Medicine Provider Triage Evaluation Note  Scott Buckley , a 81 y.o. male  was evaluated in triage.  Pt complains of feeling dizzy.  He had a Foley catheter placed 1 week ago.  He has had weakness intermittently since that time, including nearly following from generalized weakness.  No fevers or chills at home.  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  Review of Systems  Positive: Weakness, fatigue Negative: fever  Physical Exam  BP 99/65   Pulse 100   Temp 98.3 F (36.8 C)   Resp 16   Ht '6\' 3"'$  (1.905 m)   Wt 106.6 kg   SpO2 94%   BMI 29.37 kg/m  Gen:   Awake, no distress   Resp:  Normal effort  MSK:   Moves extremities without difficulty  Other:    Medical Decision Making  Medically screening exam initiated at 2:37 PM.  Appropriate orders placed.  Scott Buckley was informed that the remainder of the evaluation will be completed by another provider, this initial triage assessment does not replace that  evaluation, and the importance of remaining in the ED until their evaluation is complete.     Marquette Old, PA-C 01/25/22 1437

## 2022-01-25 NOTE — ED Triage Notes (Signed)
Patient advised to come to the ED by Karolee Ohs (urology) to be evaluated for increasing weakness and fatigue (see note in EMR); Patient did have indwelling catheter placed on 10/12 for acute urinary retention; Patient has not been able to stand or ambulate on his own like he normally does; Denies pain, only reports weakness and fatigue

## 2022-01-26 DIAGNOSIS — R339 Retention of urine, unspecified: Secondary | ICD-10-CM

## 2022-01-26 DIAGNOSIS — R531 Weakness: Secondary | ICD-10-CM

## 2022-01-26 DIAGNOSIS — L8990 Pressure ulcer of unspecified site, unspecified stage: Secondary | ICD-10-CM | POA: Insufficient documentation

## 2022-01-26 LAB — GLUCOSE, CAPILLARY
Glucose-Capillary: 71 mg/dL (ref 70–99)
Glucose-Capillary: 66 mg/dL — ABNORMAL LOW (ref 70–99)
Glucose-Capillary: 133 mg/dL — ABNORMAL HIGH (ref 70–99)

## 2022-01-26 LAB — BASIC METABOLIC PANEL
Anion gap: 7 (ref 5–15)
BUN: 52 mg/dL — ABNORMAL HIGH (ref 8–23)
CO2: 25 mmol/L (ref 22–32)
Calcium: 8.6 mg/dL — ABNORMAL LOW (ref 8.9–10.3)
Chloride: 106 mmol/L (ref 98–111)
Creatinine, Ser: 1.85 mg/dL — ABNORMAL HIGH (ref 0.61–1.24)
GFR, Estimated: 36 mL/min — ABNORMAL LOW (ref >60)
Glucose, Bld: 82 mg/dL (ref 70–99)
Potassium: 3.7 mmol/L (ref 3.5–5.1)
Sodium: 138 mmol/L (ref 135–145)

## 2022-01-26 LAB — CBG MONITORING, ED
Glucose-Capillary: 172 mg/dL — ABNORMAL HIGH (ref 70–99)
Glucose-Capillary: 107 mg/dL — ABNORMAL HIGH (ref 70–99)

## 2022-01-26 LAB — CBC
HCT: 33.8 % — ABNORMAL LOW (ref 39.0–52.0)
Hemoglobin: 10.8 g/dL — ABNORMAL LOW (ref 13.0–17.0)
MCH: 29.4 pg (ref 26.0–34.0)
MCHC: 32 g/dL (ref 30.0–36.0)
MCV: 92.1 fL (ref 80.0–100.0)
Platelets: 187 10*3/uL (ref 150–400)
RBC: 3.67 MIL/uL — ABNORMAL LOW (ref 4.22–5.81)
RDW: 13.5 % (ref 11.5–15.5)
WBC: 7.4 10*3/uL (ref 4.0–10.5)
nRBC: 0 % (ref 0.0–0.2)

## 2022-01-26 LAB — HEMOGLOBIN A1C
Hgb A1c MFr Bld: 6 % — ABNORMAL HIGH (ref 4.8–5.6)
Mean Plasma Glucose: 125.5 mg/dL

## 2022-01-26 MED ORDER — CHLORHEXIDINE GLUCONATE CLOTH 2 % EX PADS
6.0000 | MEDICATED_PAD | Freq: Every day | CUTANEOUS | Status: DC
  Administered 2022-01-26 – 2022-01-27 (×2): 6 via TOPICAL

## 2022-01-26 MED ORDER — SODIUM CHLORIDE 0.9 % IV SOLN: INTRAVENOUS | Status: AC

## 2022-01-26 NOTE — Progress Notes (Signed)
  PROGRESS NOTE    Scott Buckley  MIW:[id number] DOB: 1940-12-28 DOA: 01/25/2022 PCP: Grayland Ormond Hestle, PA-C  [id number]  LOS: 1 day   Brief hospital course:   Assessment & Plan: Scott Buckley is a 81 y.o. male with medical history significant for type 2 diabetes mellitus, hypertension, stage IIIb chronic kidney disease, prostate cancer, urinary retention who presented to the emergency room with acute onset of generalized weakness.   * Generalized weakness --initially thought to be due to UTI --PT eval, rec home with [facility]  Acute lower UTI --Pt denied any urinary symptoms, and UA and urine cx obtained on presentation was obtained from his pre-existing Foley which was placed on 01/17/22, which was likely to be colonized with bacteria.   --pt was started on ceftriaxone on presentation Plan: --cont ceftriaxone for now --f/u urine cx results  Type 2 diabetes mellitus with chronic kidney disease, without long-term current use of insulin ([facility]) --A1c 6.0, well controlled.  Not on insulin at home --d/c BG checks and SSI --resume oral hypoglycemics agents after discharge  Essential hypertension --hold home HCTZ and Lisinopril since there was concern for dehydration --cont NS'@75'$  for 12 hours  Urinary retention --saw Dr. Diamantina Providence on 01/17/22 and found to have urinary retention with CT showing bilateral hydroureteronephrosis down to the bladder.  Pt had overflow incontinence.  Suspect due to BPH.  Foley placed on 10/12. --pt presented with Foley, and it was not exchanged in the ED. Plan: --exchange Foley tomorrow after 2 days of abx tx.  Pressure injury of skin Sacrum Mid Stage 3, POA    Chronic kidney disease, stage 3b ([facility]CC) - This is fairly stable.  BPH (benign prostatic hyperplasia) -  continue Flomax.  Dyslipidemia --resume statin after discharge   DVT prophylaxis: Lovenox SQ Code Status: Full code  Family Communication: son updated at bedside today Level of  care: Med-Surg Dispo:   The patient is from: home Anticipated d/c is to: home Anticipated d/c date is: 1-2 days Patient currently is not medically ready to d/c due to: IV abx for UTI   Subjective and Interval History:  Pt reported only weakness, no urinary symptoms.  Pt presented with Foley from home, which hasn't been exchanged.  The urine collected for UA and cx was from the old Foley.     Objective: Vitals:   01/26/22 1206 01/26/22 1208 01/26/22 1341 01/26/22 1539  BP:  (!) 154/80 (!) 149/83 137/79  Pulse: 79 78  73  Resp: '14 14 18 18  '$ Temp:   98 F (36.7 C) 98.2 F (36.8 C)  TempSrc:   Oral Oral  SpO2: 99% 100% 99% 98%  Weight:      Height:        Intake/Output Summary (Last 24 hours) at 01/26/2022 1758 Last data filed at 01/26/2022 0527 Gross per 24 hour  Intake --  Output 1400 ml  Net -1400 ml   Filed Weights   01/25/22 1421 01/25/22 1433  Weight: 106.6 kg 106.6 kg    Examination:   Constitutional: NAD, AAOx3 HEENT: conjunctivae and lids normal, EOMI CV: No cyanosis.   RESP: normal respiratory effort, on RA Neuro: II - XII grossly intact.   Psych: Normal mood and affect.  Appropriate judgement and reason Foley present   Data Reviewed: I have personally reviewed labs and imaging studies  Time spent: 50 minutes   Enzo Bi, MD [facility] If 7PM-7AM, please contact night-coverage 01/26/2022, 5:58 PM

## 2022-01-26 NOTE — Assessment & Plan Note (Signed)
Sacrum Mid Stage 3, POA

## 2022-01-26 NOTE — Assessment & Plan Note (Addendum)
--  saw Dr. Diamantina Providence on 01/17/22 and found to have urinary retention with CT showing bilateral hydroureteronephrosis down to the bladder.  Pt had overflow incontinence.  Suspect due to BPH.  Foley placed on 10/12. --pt presented with Foley, and it was not exchanged in the ED. Plan: --exchange Foley tomorrow after 2 days of abx tx.

## 2022-01-26 NOTE — Progress Notes (Addendum)
Physical Therapy Evaluation Patient Details Name: Scott Buckley MRN: [id number] DOB: 1940/09/10 Today's Date: 01/26/2022  History of Present Illness  81 yo male with onset of recent UTI and indwelling foley was admitted for weakness and fatigue.  Pt was hypotensive initially and now is more controlled.  PMHx:  aneurysm, CA, DM, HTN, THA, HLD, CKD b3  Clinical Impression  Pt is seen for mobility in the ED room, and noted he is quickly fatiguing despite strength being [facility] on LE's.  He is maintaining sats 100% and pulse 82, BP was WFL.  Pt is not light headed but requires help to go just a 51' trip.  Recommend using RW for gait and will ask for HHPT to see him post hosp to restore his endurance to pre-UTI levels.        Recommendations for follow up therapy are one component of a multi-disciplinary discharge planning process, led by the attending physician.  Recommendations may be updated based on patient status, additional functional criteria and insurance authorization.  Follow Up Recommendations Home health PT      Assistance Recommended at Discharge Intermittent Supervision/Assistance  Patient can return home with the following  A little help with walking and/or transfers;A little help with bathing/dressing/bathroom;Assist for transportation;Help with stairs or ramp for entrance    Equipment Recommendations None recommended by PT  Recommendations for Other Services       Functional Status Assessment Patient has had a recent decline in their functional status and demonstrates the ability to make significant improvements in function in a reasonable and predictable amount of time.     Precautions / Restrictions Precautions Precautions: Fall Precaution Comments: monitor vitals Restrictions Weight Bearing Restrictions: No      Mobility  Bed Mobility Overal bed mobility: Needs Assistance Bed Mobility: Supine to Sit, Sit to Supine     Supine to sit: Min guard Sit to supine: Min  assist   General bed mobility comments: min assist to lift legs to bed    Transfers Overall transfer level: Needs assistance Equipment used: 1 person hand held assist Transfers: Sit to/from Stand Sit to Stand: Min guard           General transfer comment: able to stand up    Ambulation/Gait Ambulation/Gait assistance: Min assist Gait Distance (Feet): 14 Feet Assistive device: 1 person hand held assist, IV Pole Gait Pattern/deviations: Step-through pattern, Decreased stride length Gait velocity: reduced Gait velocity interpretation: <1.31 ft/sec, indicative of household ambulator Pre-gait activities: standing vitals ck General Gait Details: pt was able to walk but began to pull back and required support to avoid listing posteriorly.  Stairs            Wheelchair Mobility    Modified Rankin (Stroke Patients Only)       Balance Overall balance assessment: Needs assistance Sitting-balance support: Feet supported Sitting balance-Leahy Scale: Good     Standing balance support: Bilateral upper extremity supported, During functional activity Standing balance-Leahy Scale: Fair Standing balance comment: less than fair dynamically                             Pertinent Vitals/Pain Pain Assessment Pain Assessment: Faces Faces Pain Scale: Hurts a little bit Pain Location: rash on back Pain Descriptors / Indicators: Sore Pain Intervention(s): Monitored during session, Repositioned    Home Living Family/patient expects to be discharged to:: Private residence Living Arrangements: Spouse/significant other Available Help at Discharge: Family;Available 24 hours/day  Type of Home: House Home Access: Stairs to enter Entrance Stairs-Rails: Right Entrance Stairs-Number of Steps: 1   Home Layout: Two level;Able to live on main level with bedroom/bathroom Home Equipment: Rolling Walker (2 wheels);Cane - single point;BSC/3in1;Shower seat;Grab bars - tub/shower       Prior Function Prior Level of Function : Independent/Modified Independent             Mobility Comments: SPC and RW depending on needs       Hand Dominance   Dominant Hand: Right    Extremity/Trunk Assessment   Upper Extremity Assessment Upper Extremity Assessment: Overall WFL for tasks assessed    Lower Extremity Assessment Lower Extremity Assessment: Overall WFL for tasks assessed    Cervical / Trunk Assessment Cervical / Trunk Assessment: Normal  Communication   Communication: No difficulties  Cognition Arousal/Alertness: Awake/alert Behavior During Therapy: Impulsive Overall Cognitive Status: No family/caregiver present to determine baseline cognitive functioning                                 General Comments: unsure if this is baseline or has mild confusion from UTI        General Comments General comments (skin integrity, edema, etc.): Pt was seen for mobility on IV pole and tends not to use wb support, but does lean back and does not see this apparently to correct with support from PT    Exercises     Assessment/Plan    PT Assessment Patient needs continued PT services  PT Problem List Decreased activity tolerance;Decreased balance;Decreased mobility;Decreased coordination;Decreased safety awareness;Decreased skin integrity       PT Treatment Interventions DME instruction;Gait training;Stair training;Functional mobility training;Therapeutic activities;Therapeutic exercise;Balance training;Neuromuscular re-education;Patient/family education    PT Goals (Current goals can be found in the Care Plan section)  Acute Rehab PT Goals Patient Stated Goal: to get home with wife PT Goal Formulation: With patient Time For Goal Achievement: 02/02/22 Potential to Achieve Goals: Good    Frequency Min 2X/week     Co-evaluation               AM-PAC PT "6 Clicks" Mobility  Outcome Measure Help needed turning from your back to your  side while in a flat bed without using bedrails?: None Help needed moving from lying on your back to sitting on the side of a flat bed without using bedrails?: A Little Help needed moving to and from a bed to a chair (including a wheelchair)?: A Little Help needed standing up from a chair using your arms (e.g., wheelchair or bedside chair)?: A Little Help needed to walk in hospital room?: A Little Help needed climbing 3-5 steps with a railing? : A Lot 6 Click Score: 18    End of Session Equipment Utilized During Treatment: Gait belt Activity Tolerance: Patient limited by fatigue Patient left: in bed;with call bell/phone within reach Nurse Communication: Mobility status PT Visit Diagnosis: Unsteadiness on feet (R26.81);History of falling (Z91.81)    Time: 7782-4235 PT Time Calculation (min) (ACUTE ONLY): 27 min   Charges:   PT Evaluation $PT Eval Moderate Complexity: 1 Mod PT Treatments $Therapeutic Activity: 8-22 mins       Ramond Dial 01/26/2022, 10:19 AM  Mee Hives, PT PhD Acute Rehab Dept. Number: [place] and [place]

## 2022-01-27 DIAGNOSIS — R531 Weakness: Secondary | ICD-10-CM | POA: Diagnosis not present

## 2022-01-27 LAB — BASIC METABOLIC PANEL
Anion gap: 8 (ref 5–15)
BUN: 38 mg/dL — ABNORMAL HIGH (ref 8–23)
CO2: 24 mmol/L (ref 22–32)
Calcium: 8.5 mg/dL — ABNORMAL LOW (ref 8.9–10.3)
Chloride: 102 mmol/L (ref 98–111)
Creatinine, Ser: 1.41 mg/dL — ABNORMAL HIGH (ref 0.61–1.24)
GFR, Estimated: 49 mL/min — ABNORMAL LOW (ref >60)
Glucose, Bld: 94 mg/dL (ref 70–99)
Potassium: 4.1 mmol/L (ref 3.5–5.1)
Sodium: 134 mmol/L — ABNORMAL LOW (ref 135–145)

## 2022-01-27 LAB — GLUCOSE, CAPILLARY
Glucose-Capillary: 87 mg/dL (ref 70–99)
Glucose-Capillary: 144 mg/dL — ABNORMAL HIGH (ref 70–99)

## 2022-01-27 LAB — CBC
HCT: 33.6 % — ABNORMAL LOW (ref 39.0–52.0)
Hemoglobin: 10.8 g/dL — ABNORMAL LOW (ref 13.0–17.0)
MCH: 29.3 pg (ref 26.0–34.0)
MCHC: 32.1 g/dL (ref 30.0–36.0)
MCV: 91.1 fL (ref 80.0–100.0)
Platelets: 183 10*3/uL (ref 150–400)
RBC: 3.69 MIL/uL — ABNORMAL LOW (ref 4.22–5.81)
RDW: 13.3 % (ref 11.5–15.5)
WBC: 7.5 10*3/uL (ref 4.0–10.5)
nRBC: 0 % (ref 0.0–0.2)

## 2022-01-27 LAB — MAGNESIUM: Magnesium: 1.5 mg/dL — ABNORMAL LOW (ref 1.7–2.4)

## 2022-01-27 MED ORDER — ADULT MULTIVITAMIN W/MINERALS CH
1.0000 | ORAL_TABLET | Freq: Every day | ORAL | Status: DC
  Administered 2022-01-27: 1 via ORAL
  Filled 2022-01-27: qty 1

## 2022-01-27 MED ORDER — HYDROCHLOROTHIAZIDE 25 MG PO TABS
25.0000 mg | ORAL_TABLET | Freq: Every day | ORAL | Status: DC
  Administered 2022-01-27: 25 mg via ORAL
  Filled 2022-01-27: qty 1

## 2022-01-27 MED ORDER — MAGNESIUM SULFATE 4 GM/100ML IV SOLN
4.0000 g | Freq: Once | INTRAVENOUS | Status: AC
  Administered 2022-01-27: 4 g via INTRAVENOUS
  Filled 2022-01-27: qty 100

## 2022-01-27 MED ORDER — LISINOPRIL 20 MG PO TABS
20.0000 mg | ORAL_TABLET | Freq: Every day | ORAL | Status: DC
  Administered 2022-01-27: 20 mg via ORAL
  Filled 2022-01-27: qty 1

## 2022-01-27 NOTE — Progress Notes (Signed)
End of shift note:  Pt received IV antibiotics and IV Mg replacement. Foley care was provided. Family was updated and plan of care was reviewed with them.  BP was slightly elevated and po medications were given.

## 2022-01-27 NOTE — TOC Initial Note (Signed)
Transition of Care [facility]) - Initial/Assessment Note    Patient Details  Name: Scott Buckley MRN: [id number] Date of Birth: 11-Oct-1940  Transition of Care [facility]) CM/SW Contact:    Candie Chroman, LCSW Phone Number: 01/27/2022, 11:36 AM  Clinical Narrative:   CSW met with patient. Son at bedside. CSW introduced role and explained that PT recommendations would be discussed. He is agreeable to home health. Gave CMS scores for agencies that serve his zip code. He will have his wife review when she arrives this afternoon to determine preference. CSW called wife to let her know list is in the room. She is agreeable to home health as well. No DME recommendations at this time. No further concerns. CSW encouraged patient and his wife to contact CSW as needed. CSW will continue to follow patient and his wife for support and facilitate return home once stable. Wife or son will transport home at discharge.               Expected Discharge Plan: [place] Barriers to Discharge: Continued Medical Work up   Patient Goals and CMS Choice   CMS Medicare.gov Compare Post Acute Care list provided to:: Patient    Expected Discharge Plan and Services Expected Discharge Plan: [place] Choice: [place] arrangements for the past 2 months: Single Family Home                                      Prior Living Arrangements/Services Living arrangements for the past 2 months: Single Family Home Lives with:: Spouse Patient language and need for interpreter reviewed:: Yes Do you feel safe going back to the place where you live?: Yes      Need for Family Participation in Patient Care: Yes (Comment) Care giver support system in place?: Yes (comment) Current home services: DME Criminal Activity/Legal Involvement Pertinent to Current Situation/Hospitalization: No - Comment as needed  Activities of Daily Living Home Assistive  Devices/Equipment: Cane (specify quad or straight) ADL Screening (condition at time of admission) Patient's cognitive ability adequate to safely complete daily activities?: Yes Is the patient deaf or have difficulty hearing?: No Does the patient have difficulty seeing, even when wearing glasses/contacts?: No Does the patient have difficulty concentrating, remembering, or making decisions?: No Patient able to express need for assistance with ADLs?: Yes Does the patient have difficulty dressing or bathing?: No Independently performs ADLs?: Yes (appropriate for developmental age) Does the patient have difficulty walking or climbing stairs?: Yes Weakness of Legs: Both Weakness of Arms/Hands: None  Permission Sought/Granted Permission sought to share information with : Facility Sport and exercise psychologist, Family Supports Permission granted to share information with : Yes, Verbal Permission Granted  Share Information with NAME: Nieves Barberi  Permission granted to share info w AGENCY: [place] granted to share info w Relationship: Wife, Son at bedside  Permission granted to share info w Contact Information: Wife: [phone]  Emotional Assessment Appearance:: Appears stated age Attitude/Demeanor/Rapport: Engaged, Gracious Affect (typically observed): Accepting, Appropriate, Calm, Pleasant Orientation: : Oriented to Self, Oriented to Place, Oriented to  Time, Oriented to Situation Alcohol / Substance Use: Not Applicable Psych Involvement: No (comment)  Admission diagnosis:  Lower urinary tract infectious disease [N39.0] Weakness [R53.1] Acute lower UTI [N39.0] Patient Active Problem List   Diagnosis Date Noted   Pressure  injury of skin 01/26/2022   Urinary retention 01/26/2022   Acute lower UTI 01/25/2022   Generalized weakness 01/25/2022   Essential hypertension 01/25/2022   Type 2 diabetes mellitus with chronic kidney disease, without long-term current use of  insulin ([place]) 01/25/2022   Dyslipidemia 01/25/2022   BPH (benign prostatic hyperplasia) 01/25/2022   Chronic kidney disease, stage 3b ([place]) 01/25/2022   Personal history of other malignant neoplasm of skin 07/06/2019   Mechanical ectropion of right lower eyelid 10/26/2018   Visual field defect 10/26/2018   B12 deficiency 06/10/2018   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   Ectropion of left lower eyelid 08/22/2017   Elevated erythrocyte sedimentation rate 08/18/2017   Screening for prostate cancer 08/14/2017   Cellulitis of left lower extremity 07/17/2017   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 07/12/2016   Sepsis ([place]) 07/01/2016   UTI (urinary tract infection) 07/01/2016   HTN (hypertension) 07/01/2016   Diabetes ([place]) 07/01/2016   CKD (chronic kidney disease), stage III ([place]) 07/01/2016   Nephrolithiasis 01/01/2016   Chronic left-sided low back pain without sciatica 11/02/2015   Malignant neoplasm of prostate ([place]) 10/11/2014   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

## 2022-01-27 NOTE — Progress Notes (Signed)
[name] [place] NAME: Scott Buckley    MR#:  [id number]  DATE OF BIRTH:  [birth year]  SUBJECTIVE:  son present at bedside. Patient is awake and alert. Walked around with physical therapy yesterday. No fever. Recently had Foley catheter placed in at urology office due to acute urinary retention on January 17, 2022. Came in with weakness and fatigue after was asked by urology to come to the ED.   VITALS:  Blood pressure (!) 170/83, pulse 72, temperature 98.3 F (36.8 C), temperature source Oral, resp. rate 12, height '6\' 3"'$  (1.905 m), weight 106.6 kg, SpO2 98 %.  PHYSICAL EXAMINATION:   GENERAL:  81 y.o.-year-old patient lying in the bed with no acute distress.  LUNGS: Normal breath sounds bilaterally, no wheezing CARDIOVASCULAR: S1, S2 normal. No murmurs,   ABDOMEN: Soft, nontender, nondistended. Bowel sounds present.  Chronic Foley+  EXTREMITIES: No  edema b/l.    NEUROLOGIC: nonfocal  patient is alert and awake SKIN: No obvious rash, lesion, or ulcer.   LABORATORY PANEL:  CBC Recent Labs  Lab 01/27/22 0506  WBC 7.5  HGB 10.8*  HCT 33.6*  PLT 183    Chemistries  Recent Labs  Lab 01/27/22 0506  NA 134*  K 4.1  CL 102  CO2 24  GLUCOSE 94  BUN 38*  CREATININE 1.41*  CALCIUM 8.5*  MG 1.5*    Assessment and Plan Scott Buckley is a 81 y.o. male with medical history significant for type 2 diabetes mellitus, hypertension, stage IIIb chronic kidney disease, prostate cancer, urinary retention who presented to the emergency room with acute onset of generalized weakness.   Generalized weakness --unclear etiology  --PT eval, rec home with HH   Abnormal UA --Pt denied any urinary symptoms, and UA and urine cx obtained on presentation was obtained from his pre-existing Foley which was placed on 01/17/22, which was likely to be colonized with bacteria.   --pt was started on ceftriaxone on presentation --patient afebrile.  White count normal. Appears this is colonization.-- Urine culture growing staph hemolytic [place]. Received Rocephin.   Type 2 diabetes mellitus with chronic kidney disease, without long-term current use of insulin (HCC) --A1c 6.0, well controlled.  Not on insulin at home --resume oral hypoglycemics agents after discharge   Essential hypertension -blood pressure rising up. Resume home meds.  Urinary retention with h/o BPH --saw Dr. Diamantina Providence on 01/17/22 and found to have urinary retention with CT showing bilateral hydroureteronephrosis down to the bladder.  Pt had overflow incontinence.  Suspect due to BPH.  Foley placed on 10/12 by Urology --since patient is asymptomatic will continue with current Foley then was placed 10 days ago.  Pressure injury of skin Sacrum Mid Stage 3, POA  cont wound care per protocol   Chronic kidney disease, stage 3b ([place]) - fairly stable.   BPH (benign prostatic hyperplasia) -  continue Flomax.   Dyslipidemia --resume statin after discharge     DVT prophylaxis: Lovenox SQ Code Status: Full code  Family Communication: son updated at bedside today Level of care: Med-Surg Dispo:   The patient is from: home Anticipated d/c is to: home Anticipated d/c date is: tomorrow         TOTAL TIME TAKING CARE OF THIS PATIENT: 35 minutes.  >50% time spent on counselling and coordination of care  Note: This dictation was prepared with Dragon dictation along with smaller phrase technology. Any transcriptional errors that result from this  process are unintentional.  Fritzi Mandes M.D    [facility]   CC: Primary care physician; [place], [employer], PA-C

## 2022-01-28 DIAGNOSIS — R531 Weakness: Secondary | ICD-10-CM | POA: Diagnosis not present

## 2022-01-28 DIAGNOSIS — N1832 Chronic kidney disease, stage 3b: Secondary | ICD-10-CM | POA: Diagnosis not present

## 2022-01-28 DIAGNOSIS — N401 Enlarged prostate with lower urinary tract symptoms: Secondary | ICD-10-CM | POA: Diagnosis not present

## 2022-01-28 DIAGNOSIS — E1122 Type 2 diabetes mellitus with diabetic chronic kidney disease: Secondary | ICD-10-CM | POA: Diagnosis not present

## 2022-01-28 DIAGNOSIS — R338 Other retention of urine: Secondary | ICD-10-CM

## 2022-01-28 LAB — URINE CULTURE: Culture: >=100000 — AB

## 2022-01-28 LAB — MAGNESIUM: Magnesium: 2.1 mg/dL (ref 1.7–2.4)

## 2022-01-28 NOTE — TOC Transition Note (Signed)
Transition of Care [facility]) - CM/SW Discharge Note   Patient Details  Name: Scott Buckley MRN: [id number] Date of Birth: [birth year]  [facility]) CM/SW Contact:  Beverly Sessions, RN Phone Number: 01/28/2022, 11:10 AM   Clinical Narrative:      Patient to discharge today Followed up with patient, wife and daughter at bedside.  They selected [facility] home health for discharge.  Referral made and accepted by Claiborne Billings with [facility]  Family to transport at discharge    Barriers resolved   Patient Goals and CMS Choice   CMS Medicare.gov Compare Post Acute Care list provided to:: Patient    Discharge Placement                       Discharge Plan and Services     Post Acute Care Choice: Home Health                               Social Determinants of Health (SDOH) Interventions     Readmission Risk Interventions     No data to display

## 2022-01-28 NOTE — Discharge Summary (Signed)
Physician Discharge Summary   Patient: Scott Buckley MRN: [id number] DOB: 1940/10/07  Admit date:     01/25/2022  Discharge date: 01/28/22  Discharge Physician: Fritzi Mandes   PCP: Donnamarie Rossetti, PA-C   Recommendations at discharge:    F/u Dr Diamantina Providence on your appt F/u PCP in 1-2 weeks  Discharge Diagnoses: Principal Problem:   Generalized weakness Active Problems:   Acute lower UTI   Essential hypertension   Type 2 diabetes mellitus with chronic kidney disease, without long-term current use of insulin (HCC)   Dyslipidemia   BPH (benign prostatic hyperplasia)   Chronic kidney disease, stage 3b ([place])   Pressure injury of skin   Urinary retention  Hospital Course: Scott Buckley is a 81 y.o. male with medical history significant for type 2 diabetes mellitus, hypertension, stage IIIb chronic kidney disease, prostate cancer, urinary retention who presented to the emergency room with acute onset of generalized weakness.   Generalized weakness --unclear etiology  --PT eval, rec home with HH   Abnormal UA --Pt denied any urinary symptoms, and UA and urine cx obtained on presentation was obtained from his pre-existing Foley which was placed on 01/17/22, which was likely to be colonized with bacteria.   --pt was started on ceftriaxone on presentation --patient afebrile. White count normal. -- Urine culture growing staph hemolyticus. Received Rocephin x 3 doses (d/w pharmacy)   Type 2 diabetes mellitus with chronic kidney disease, without long-term current use of insulin (HCC) --A1c 6.0, well controlled.  Not on insulin at home --resume oral diabetic agents after discharge   Essential hypertension -blood pressure rising up. Resume home meds.   Urinary retention with h/o BPH --saw Dr. Diamantina Providence on 01/17/22 and found to have urinary retention with CT showing bilateral hydroureteronephrosis down to the bladder.  Pt had overflow incontinence.  Suspect due to BPH.  Foley placed  on 10/12 by Urology --since patient is asymptomatic will continue with current Foley then was placed 10 days ago.   Pressure injury of skin Sacrum Mid Stage 3, POA  cont wound care per protocol   Chronic kidney disease, stage 3b ([place]) - fairly stable.  BPH (benign prostatic hyperplasia) -  continue Flomax.   Dyslipidemia --resume statin     DVT prophylaxis: Lovenox SQ Code Status: Full code  Family Communication:  family updated at bedside today Level of care: Med-Surg Dispo:   The patient is from: home Anticipated d/c is to: home with [facility] Anticipated d/c date is: today           Disposition: Home health Diet recommendation:  Discharge Diet Orders (From admission, onward)     Start     Ordered   01/28/22 0000  Diet - low sodium heart healthy        01/28/22 0947           Cardiac and Carb modified diet DISCHARGE MEDICATION: Allergies as of 01/28/2022       Reactions   Metformin Diarrhea   Sulfa Antibiotics Nausea And Vomiting, Other (See Comments)   Patient states not allergic   Varicella Virus Vaccine Live Other (See Comments)   Cramps and weakness   Zoster Vac Recomb Adjuvanted    Other reaction(s): Not available        Medication List     TAKE these medications    acetaminophen 650 MG CR tablet Commonly known as: TYLENOL Take by mouth.   cyanocobalamin 1000 MCG/ML injection Commonly known as: VITAMIN B12 Inject 1,000  mcg into the muscle every 30 (thirty) days.   Dulaglutide 3 MG/0.5ML Sopn Inject into the skin.   glimepiride 4 MG tablet Commonly known as: AMARYL Take 4 mg by mouth 2 (two) times daily.   hydrochlorothiazide 25 MG tablet Commonly known as: HYDRODIURIL Take 25 mg by mouth daily.   lisinopril 20 MG tablet Commonly known as: ZESTRIL Take 20 mg by mouth daily.   multivitamin tablet Take 1 tablet by mouth daily.   tamsulosin 0.4 MG Caps capsule Commonly known as: FLOMAX Take 1 capsule (0.4 mg total) by mouth  daily after supper.   Vitamin D3 50 MCG (2000 UT) capsule Take 2,000 Units by mouth daily.               Discharge Care Instructions  (From admission, onward)           Start     Ordered   01/28/22 0000  Discharge wound care:       Comments: Foam padding as needed   01/28/22 4098            Follow-up Information     Grayland Ormond Hestle, PA-C. Schedule an appointment as soon as possible for a visit in 1 week(s).   Specialty: Family Medicine Why: hospital f/u Contact information: [place] [address] [phone]         Billey Co, MD. Go to.   Specialty: Urology Why: on your scheduled appt Contact information: [place] [address] [phone]                Discharge Exam: Danley Danker Weights   01/25/22 1421 01/25/22 1433  Weight: 106.6 kg 106.6 kg     Condition at discharge: fair  The results of significant diagnostics from this hospitalization (including imaging, microbiology, ancillary and laboratory) are listed below for reference.   Imaging Studies: CT ABDOMEN PELVIS WO CONTRAST  Result Date: 01/09/2022 CLINICAL DATA:  Bilateral hydronephrosis, urinary incontinence EXAM: CT ABDOMEN AND PELVIS WITHOUT CONTRAST TECHNIQUE: Multidetector CT imaging of the abdomen and pelvis was performed following the standard protocol without IV contrast. RADIATION DOSE REDUCTION: This exam was performed according to the departmental dose-optimization program which includes automated exposure control, adjustment of the mA and/or kV according to patient size and/or use of iterative reconstruction technique. COMPARISON:  07/02/2017 FINDINGS: Lower chest: No acute abnormality. Thoracic aortic atherosclerosis. Coronary artery atherosclerosis. Hepatobiliary: No focal liver abnormality is seen. No gallstones, gallbladder wall thickening, or biliary dilatation. Pancreas: Unremarkable. No pancreatic ductal dilatation or  surrounding inflammatory changes. Spleen: Normal in size without focal abnormality. Adrenals/Urinary Tract: Adrenal glands are unremarkable. Severe bilateral hydroureteronephrosis. Small dystrophic calcification in the right perinephric fat and along the capsule. Multiple small left renal cysts for which no further evaluation is recommended at this time. Small 8 mm hyperdense left posterior interpolar renal mass likely reflecting a hemorrhagic/proteinaceous cyst unchanged in size compared with 07/02/2017; no follow-up recommended. Bladder is unremarkable. Stomach/Bowel: Stomach is within normal limits. No evidence of bowel wall thickening, distention, or inflammatory changes. No pneumatosis, pneumoperitoneum or portal venous gas. Diverticulosis without evidence of diverticulitis. Vascular/Lymphatic: Aorta bi-iliac stent graft noted. No lymphadenopathy. Reproductive: Prostate is unremarkable. Other: No abdominal wall hernia or abnormality. No abdominopelvic ascites. Musculoskeletal: No acute osseous abnormality. No aggressive osseous lesion. Left hip arthroplasty. Severe osteoarthritis of the right hip. Degenerative disease with disc height loss throughout the lumbar spine with bilateral facet arthropathy. Grade 1 anterolisthesis of L4 on L5 secondary  to facet disease. Osteoarthritis of bilateral SI joints. Subacute versus chronic left superior pubic ramus fracture. IMPRESSION: 1. Severe bilateral hydroureteronephrosis. No urolithiasis. 2. Diverticulosis without evidence of diverticulitis. 3.  Aortic Atherosclerosis (ICD10-I70.0). 4. Multi vessel coronary artery atherosclerosis. Electronically Signed   By: Kathreen Devoid M.D.   On: 01/09/2022 11:06   MR LUMBAR SPINE WO CONTRAST  Result Date: 01/08/2022 CLINICAL DATA:  Low back pain and right lower extremity numbness EXAM: MRI LUMBAR SPINE WITHOUT CONTRAST TECHNIQUE: Multiplanar, multisequence MR imaging of the lumbar spine was performed. No intravenous contrast was  administered. COMPARISON:  None Available. FINDINGS: Segmentation:  Standard. Alignment:  Grade 1 retrolisthesis at L1-2 and L2-3. Vertebrae:  No fracture, evidence of discitis, or bone lesion. Conus medullaris and cauda equina: Conus extends to the T12 level. Conus and cauda equina appear normal. Paraspinal and other soft tissues: Bilateral hydroureteronephrosis. Disc levels: L1-L2: Mild facet hypertrophy. No spinal canal stenosis. No neural foraminal stenosis. L2-L3: Moderate facet hypertrophy with small disc bulge and endplate spurring. Small superimposed left subarticular disc extrusion with inferior migration. No spinal canal stenosis. No neural foraminal stenosis. L3-L4: New synovial cyst arising from the anterior aspect of the right facet measuring 3 x 2 mm. This contributes to right lateral recess narrowing with deviation of the right L4 nerve root. No spinal canal stenosis. No neural foraminal stenosis. L4-L5: Severe facet hypertrophy. Grade 1 anterolisthesis disc uncovering. No spinal canal stenosis. No neural foraminal stenosis. L5-S1: Unchanged small left subarticular disc protrusion displacing the left S1 nerve root. No spinal canal stenosis. No neural foraminal stenosis. Visualized sacrum: Normal. IMPRESSION: 1. New 3 x 2 mm synovial cyst arising from the anterior aspect of the right L3-L4 facet contributes to right lateral recess narrowing with deviation of the right L4 nerve root. 2. Unchanged small left subarticular disc protrusion displacing the left S1 nerve root. 3. Severe L4-5 facet arthrosis with grade 1 anterolisthesis. 4. Bilateral severe hydroureteronephrosis. If there are symptoms of urinary obstruction, consider renal ultrasound or CT abdomen/pelvis. Electronically Signed   By: Ulyses Jarred M.D.   On: 01/08/2022 19:59    Microbiology: Results for orders placed or performed during the hospital encounter of 01/25/22  Culture, blood (routine x 2)     Status: None (Preliminary result)    Collection Time: 01/25/22  2:38 PM   Specimen: Right Antecubital; Blood  Result Value Ref Range Status   Specimen Description RIGHT ANTECUBITAL  Final   Special Requests   Final    BOTTLES DRAWN AEROBIC AND ANAEROBIC Blood Culture adequate volume   Culture   Final    NO GROWTH 3 DAYS Performed at [facility], [address], [place], [place] [address]    Report Status PENDING  Incomplete  Culture, blood (routine x 2)     Status: None (Preliminary result)   Collection Time: 01/25/22  6:18 PM   Specimen: Right Antecubital; Blood  Result Value Ref Range Status   Specimen Description RIGHT ANTECUBITAL  Final   Special Requests   Final    BOTTLES DRAWN AEROBIC ONLY Blood Culture results may not be optimal due to an inadequate volume of blood received in culture bottles   Culture   Final    NO GROWTH 3 DAYS Performed at [facility], [address] [place],  [address]    Report Status PENDING  Incomplete  Urine Culture     Status: Abnormal   Collection Time: 01/25/22  6:19 PM   Specimen: Urine, Catheterized  Result Value Ref Range Status   Specimen Description   Final    URINE, CATHETERIZED Performed at [facility], [address] [place], [place] [address]    Special Requests   Final    NONE Performed at [facility], [address] [place],  [address]    Culture >=100,000 COLONIES/mL STAPHYLOCOCCUS HAEMOLYTICUS (A)  Final   Report Status 01/28/2022 FINAL  Final   Organism ID, Bacteria STAPHYLOCOCCUS HAEMOLYTICUS (A)  Final      Susceptibility   Staphylococcus haemolyticus - MIC*    CIPROFLOXACIN <=0.5 SENSITIVE Sensitive     GENTAMICIN <=0.5 SENSITIVE Sensitive     NITROFURANTOIN <=16 SENSITIVE Sensitive     OXACILLIN >=4 RESISTANT Resistant     TETRACYCLINE >=16 RESISTANT Resistant     VANCOMYCIN 1 SENSITIVE Sensitive     TRIMETH/SULFA 20 SENSITIVE Sensitive     CLINDAMYCIN RESISTANT Resistant     RIFAMPIN <=0.5  SENSITIVE Sensitive     Inducible Clindamycin POSITIVE Resistant     * >=100,000 COLONIES/mL STAPHYLOCOCCUS HAEMOLYTICUS    Labs: CBC: Recent Labs  Lab 01/25/22 1441 01/26/22 0520 01/27/22 0506  WBC 10.3 7.4 7.5  NEUTROABS 7.2  --   --   HGB 12.1* 10.8* 10.8*  HCT 38.2* 33.8* 33.6*  MCV 92.7 92.1 91.1  PLT 219 187 527   Basic Metabolic Panel: Recent Labs  Lab 01/25/22 1441 01/26/22 0520 01/27/22 0506 01/28/22 0435  NA 135 138 134*  --   K 4.5 3.7 4.1  --   CL 98 106 102  --   CO2 '27 25 24  '$ --   GLUCOSE 117* 82 94  --   BUN 49* 52* 38*  --   CREATININE 2.00* 1.85* 1.41*  --   CALCIUM 9.4 8.6* 8.5*  --   MG  --   --  1.5* 2.1   Liver Function Tests: No results for input(s): "AST", "ALT", "ALKPHOS", "BILITOT", "PROT", "ALBUMIN" in the last 168 hours. CBG: Recent Labs  Lab 01/26/22 1632 01/26/22 2037 01/26/22 2149 01/27/22 0750 01/27/22 1609  GLUCAP 71 66* 133* 87 144*    Discharge time spent: greater than 30 minutes.  Signed: Fritzi Mandes, MD [facility] 01/28/2022

## 2022-01-28 NOTE — Discharge Instructions (Signed)
Foley care per protocol ?

## 2022-01-28 NOTE — Progress Notes (Signed)
Pt dishaged per MD order. IV removed. Discharge instructions reviewed with pt and wife. All questions answered to pt satisfaction. Pt taken out in wheelchair by staff.

## 2022-01-28 NOTE — Plan of Care (Signed)

## 2022-01-30 LAB — CULTURE, BLOOD (ROUTINE X 2)
Culture: NO GROWTH
Culture: NO GROWTH
Special Requests: ADEQUATE

## 2022-01-31 ENCOUNTER — Encounter: Payer: Self-pay | Admitting: Urology

## 2022-01-31 ENCOUNTER — Ambulatory Visit: Payer: Medicare HMO | Admitting: Urology

## 2022-01-31 ENCOUNTER — Ambulatory Visit (INDEPENDENT_AMBULATORY_CARE_PROVIDER_SITE_OTHER): Payer: Medicare HMO | Admitting: Urology

## 2022-01-31 VITALS — BP 141/71 | HR 98 | Ht 75.0 in | Wt 235.0 lb

## 2022-01-31 DIAGNOSIS — N1330 Unspecified hydronephrosis: Secondary | ICD-10-CM | POA: Diagnosis not present

## 2022-01-31 DIAGNOSIS — R339 Retention of urine, unspecified: Secondary | ICD-10-CM

## 2022-01-31 LAB — BLADDER SCAN AMB NON-IMAGING

## 2022-01-31 MED ORDER — CIPROFLOXACIN HCL 500 MG PO TABS
500.0000 mg | ORAL_TABLET | Freq: Once | ORAL | Status: AC
  Administered 2022-01-31: 500 mg via ORAL

## 2022-01-31 NOTE — Progress Notes (Signed)
   01/31/2022 11:03 AM   Scott Buckley 04/25/1940 [id number]  Reason for visit: Follow up overflow incontinence with hydronephrosis  HPI: Frail 81 year old male who I originally saw on 01/17/2022 with urinary frequency and overflow incontinence.  A CT from his primary care showed a 60 g prostate with bilateral hydroureteronephrosis down to a distended bladder.  He also was treated for a possible UTI through PCP.  History is also notable for high risk prostate cancer treated at [facility] with radiation in 2005, and PSA has remained very low around 0.5 since that time.  He underwent cystoscopy that day to evaluate for urethral stricture, but no stricture was found, and prostate appeared obstructive with a distended bladder and moderate bladder trabeculations.  A Foley was placed and Flomax was started.  Urinary retention was felt to be secondary to BPH.  We discussed the challenges of managing outlet obstruction in patients who have had radiation previously with higher risk for incontinence or complications.  He was admitted to the hospital with weakness on 01/25/2022 of unclear etiology, questionable UTI and urine culture ultimately grew staph hemolyticus, and was treated with a few days of ceftriaxone.  He feels much better since discharge, and this may have just been secondary to dehydration and or some postobstructive diuresis.  He feels he has been tolerating the Flomax well.  Presents today for voiding trial.  Cipro was given for prophylaxis, and Foley catheter was removed this morning.  He returned this afternoon and has not been able to void, but bladder scan only 100 mL.  I encouraged him to continue to push fluids, and recommended following up in the morning with PA for PVR.  If voiding with PVR less than 200 mL would recommend continuing Flomax and close follow-up in 1 month for repeat PVR.  If unable to urinate or PVR greater than ~352m would recommend replacing Foley.  Could repeat void trial in  2 weeks again, or consider UroLift.  With his history of radiation for prostate cancer would be very high risk for long-term incontinence if opted for HOLEP.  I spent 35 total minutes on the day of the encounter including pre-visit review of the medical record, face-to-face time with the patient, and post visit ordering of labs/imaging/tests.   BBilley Co [facility] [address] [place] [phone]

## 2022-01-31 NOTE — Progress Notes (Signed)
Patient ID: Scott Buckley, male   DOB: 23-Jun-1940, 81 y.o.   MRN: [id number] Catheter Removal  Patient is present today for a catheter removal.  50m of water was drained from the balloon. A 18FR foley cath was removed from the bladder, no complications were noted. Patient tolerated well.  Performed by: DEdwin Dada CMA  Follow up/ Additional notes: this afternoon

## 2022-02-01 ENCOUNTER — Encounter: Payer: Self-pay | Admitting: Physician Assistant

## 2022-02-01 ENCOUNTER — Ambulatory Visit (INDEPENDENT_AMBULATORY_CARE_PROVIDER_SITE_OTHER): Payer: Medicare HMO | Admitting: Physician Assistant

## 2022-02-01 DIAGNOSIS — R339 Retention of urine, unspecified: Secondary | ICD-10-CM

## 2022-02-01 LAB — BLADDER SCAN AMB NON-IMAGING

## 2022-02-01 NOTE — Patient Instructions (Signed)
Keep using the cream on your wound that you already have. Ask your friend to order you "sacral foam dressings" from [employer]. A picture is below. Apply these on top of the cream.

## 2022-02-01 NOTE — Progress Notes (Signed)
02/01/2022 12:18 PM   [name] May 09, 1940 [id number]  CC: Chief Complaint  Patient presents with   Urinary Retention   HPI: Scott Buckley is a 81 y.o. 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  He is accompanied today by his wife, who contributes to HPI.  Today he reports he was able to void in small spurts overnight but denies any pain.  PVR 635 mL.  PMH: Past Medical History:  Diagnosis Date   Aneurysm ([place])    Cancer ([place])    Diabetes mellitus without complication ([place])    Hypertension    Renal disorder     Surgical History: Past Surgical History:  Procedure Laterality Date   HERNIA REPAIR     JOINT REPLACEMENT     TOTAL HIP REVISION      Home Medications:  Allergies as of 02/01/2022       Reactions   Metformin Diarrhea   Sulfa Antibiotics Nausea And Vomiting, Other (See Comments)   Patient states not allergic   Varicella Virus Vaccine Live Other (See Comments)   Cramps and weakness   Zoster Vac Recomb Adjuvanted    Other reaction(s): Not available        Medication List        Accurate as of February 01, 2022 12:18 PM. If you have any questions, ask your nurse or doctor.          acetaminophen 650 MG CR tablet Commonly known as: TYLENOL Take by mouth.   cyanocobalamin 1000 MCG/ML injection Commonly known as: VITAMIN B12 Inject 1,000 mcg into the muscle every 30 (thirty) days.   Dulaglutide 3 MG/0.5ML Sopn Inject into the skin.   glimepiride 4 MG tablet Commonly known as: AMARYL Take 4 mg by mouth 2 (two) times daily.   hydrochlorothiazide 25 MG tablet Commonly known as: HYDRODIURIL Take 25 mg by mouth daily.   lisinopril 20 MG tablet Commonly known as: ZESTRIL Take 20 mg by mouth daily.   multivitamin tablet Take 1 tablet by mouth daily.   tamsulosin 0.4 MG  Caps capsule Commonly known as: FLOMAX Take 1 capsule (0.4 mg total) by mouth daily after supper.   Vitamin D3 50 MCG (2000 UT) capsule Take 2,000 Units by mouth daily.        Allergies:  Allergies  Allergen Reactions   Metformin Diarrhea   Sulfa Antibiotics Nausea And Vomiting and Other (See Comments)    Patient states not allergic   Varicella Virus Vaccine Live Other (See Comments)    Cramps and weakness   Zoster Vac Recomb Adjuvanted     Other reaction(s): Not available    Family History: Family History  Problem Relation Age of Onset   Hyperlipidemia Mother    Hypertension Mother    Diabetes Mother    Hypertension Father    Heart disease Father    Diabetes Brother     Social History:   reports that he has been smoking cigars. He has been exposed to tobacco smoke. He has never used smokeless tobacco. He reports current alcohol use of about 4.0 - 6.0 standard drinks of alcohol per week. He reports that he does not use drugs.  Physical Exam: There were no vitals taken for this visit.  Constitutional:  Alert and oriented, no acute distress, nontoxic appearing HEENT: [place], AT  Cardiovascular: No clubbing, cyanosis, or edema Respiratory: Normal respiratory effort, no increased work of breathing Skin: No rashes, bruises or suspicious lesions Neurologic: Grossly intact, no focal deficits, moving all 4 extremities Psychiatric: Normal mood and affect  Laboratory Data: Results for orders placed or performed in visit on 02/01/22  BLADDER SCAN AMB NON-IMAGING  Result Value Ref Range   Scan Result 628m    Simple Catheter Placement  Due to urinary retention patient is present today for a foley cath placement.  Patient was cleaned and prepped in a sterile fashion with betadine and 2% lidocaine jelly was instilled into the urethra. A 16 FR coude foley catheter was inserted, urine return was noted  7276m urine was yellow in color.  The balloon was filled with 10cc of sterile  water.  A night bag was attached for drainage. Patient was given instruction on proper catheter care.  Patient tolerated well, no complications were noted   Performed by: SaDebroah LoopPA-C   Assessment & Plan:   1. Urinary retention Voiding trial failed, now and recurrent urinary retention.  We discussed various treatment options including Foley catheter replacement versus CIC teaching and he elected for the former, see procedure note above.  We discussed a repeat voiding trial in 2 weeks versus pursuing UroLift surgery as recommended by Dr. SnDiamantina Providence He is unsure about pursuing surgery at this time.  We will plan for voiding trial in 2 weeks but I also gave him additional information about UroLift and asked them to reach out to [place] if they decide they wish to pursue surgery, in which case we can arrange this for them. - BLADDER SCAN AMB NON-IMAGING  Return in about 2 weeks (around 02/15/2022) for Voiding trial, call sooner if you want to do the UroLift surgery with Dr. SnDiamantina Providence SaDebroah LoopPA-C  [facility] [address] CourtSuDiomedeuRancho Viejo[place] [phone]

## 2022-02-15 ENCOUNTER — Encounter: Payer: Self-pay | Admitting: Physician Assistant

## 2022-02-15 ENCOUNTER — Telehealth: Payer: Self-pay

## 2022-02-15 ENCOUNTER — Other Ambulatory Visit: Payer: Self-pay | Admitting: Physician Assistant

## 2022-02-15 ENCOUNTER — Ambulatory Visit: Payer: Medicare HMO | Admitting: Physician Assistant

## 2022-02-15 ENCOUNTER — Ambulatory Visit (INDEPENDENT_AMBULATORY_CARE_PROVIDER_SITE_OTHER): Payer: Medicare HMO | Admitting: Physician Assistant

## 2022-02-15 VITALS — BP 114/74 | HR 112 | Ht 75.0 in | Wt 235.0 lb

## 2022-02-15 DIAGNOSIS — R339 Retention of urine, unspecified: Secondary | ICD-10-CM | POA: Diagnosis not present

## 2022-02-15 DIAGNOSIS — N138 Other obstructive and reflux uropathy: Secondary | ICD-10-CM

## 2022-02-15 DIAGNOSIS — N401 Enlarged prostate with lower urinary tract symptoms: Secondary | ICD-10-CM

## 2022-02-15 NOTE — Progress Notes (Signed)
Surgical Physician Order Form [facility] [place]  Dr. Diamantina Providence * Scheduling expectation :  01/22/2022  *Length of Case:   *Clearance needed: no  *Anticoagulation Instructions: N/A  *Aspirin Instructions: N/A  *Post-op visit Date/Instructions:  1-3 day voiding trial  *Diagnosis: BPH w/urinary obstruction  *Procedure: Cystoscopy with Insertion of Urolift   Additional orders: N/A  -Admit type: OUTpatient  -Anesthesia: MAC  -VTE Prophylaxis Standing Order SCD's       Other:   -Standing Lab Orders Per Anesthesia    Lab other: None  -Standing Test orders EKG/Chest x-ray per Anesthesia       Test other:   - Medications:  Ancef 2gm IV  -Other orders:  N/A

## 2022-02-15 NOTE — Progress Notes (Signed)
Patient presented to clinic today for scheduled voiding trial after failing a voiding trial with the 2 weeks ago.  He is accompanied today by his daughter.  He reports his wife is currently hospitalized.  Today they report they do not wish to pursue scheduled voiding trial and instead they would like to move forward with plans for UroLift as offered by Dr. Diamantina Providence.  I obtained a urine sample from his existing Foley catheter today for preop UA and culture.  Or orders placed.  We discussed the need to keep the catheter in the immediate postoperative period with plan to repeat voiding trial postoperatively.  We will target 02/22/2022 for surgery.  He is in agreement with this plan.

## 2022-02-15 NOTE — Telephone Encounter (Signed)
I spoke with Scott Buckley and daughter while in office. We have discussed possible surgery dates and Friday November 17th, 2023 was agreed upon by all parties. Patient given information about surgery date, what to expect pre-operatively and post operatively.  We discussed that a Pre-Admission Testing office will be calling to set up the pre-op visit that will take place prior to surgery, and that these appointments are typically done over the phone with a Pre-Admissions RN.  Informed patient that our office will communicate any additional care to be provided after surgery. Patients questions or concerns were discussed during our call. Advised to call our office should there be any additional information, questions or concerns that arise. Patient verbalized understanding.

## 2022-02-15 NOTE — Progress Notes (Signed)
[facility] Posting Form   Surgery Date/Time: Date: 02/22/2022  Surgeon: Dr. Nickolas Madrid, MD  Surgery Location: Day Surgery  Inpt ( No  )   Outpt (Yes)   Obs ( No  )   Diagnosis: N40.1, N13.8 Benign Prostatic Hyperplasia with Urinary Obstruction  -CPT: 19166, [phone]  Surgery: Cystoscopy with Insertion of Urolift  Stop Anticoagulations: N/A  Cardiac/Medical/Pulmonary Clearance needed: no  *Orders entered into EPIC  Date: 02/15/22   *Case booked in EPIC  Date: 02/15/22  *Notified pt of Surgery: Date: 02/15/22  PRE-OP UA & CX: yes, obtained in clinic today  *Placed into Prior Authorization Work Que Date: 02/15/22  Assistant/laser/rep:No

## 2022-02-18 LAB — CULTURE, URINE COMPREHENSIVE

## 2022-02-19 ENCOUNTER — Encounter
Admission: RE | Admit: 2022-02-19 | Discharge: 2022-02-19 | Disposition: A | Discharge status: Home / Self Care | Payer: MEDICARE | Source: Ambulatory Visit | Attending: Urology | Admitting: Urology

## 2022-02-19 VITALS — Ht 74.0 in | Wt 235.0 lb

## 2022-02-19 DIAGNOSIS — E1122 Type 2 diabetes mellitus with diabetic chronic kidney disease: Secondary | ICD-10-CM

## 2022-02-19 DIAGNOSIS — I10 Essential (primary) hypertension: Secondary | ICD-10-CM

## 2022-02-19 NOTE — Patient Instructions (Addendum)
Your procedure is scheduled on: Friday February 22, 2022. Report to Day Surgery inside [place] 2nd floor, stop by registration desk before getting on elevator.  To find out your arrival time please call [phone] between 1PM - 3PM on Thursday February 21, 2022.  Remember: Instructions that are not followed completely may result in serious medical risk,  up to and including death, or upon the discretion of your surgeon and anesthesiologist your  surgery may need to be rescheduled.     _X__ 1. Do not eat food or drink fluids after midnight the night before your procedure.                 No chewing gum or hard candies.   __X__2.  On the morning of surgery brush your teeth with toothpaste and water, you                may rinse your mouth with mouthwash if you wish.  Do not swallow any toothpaste or mouthwash.     _X__ 3.  No Alcohol for 24 hours before or after surgery.   _X__ 4.  Do Not Smoke or use e-cigarettes For 24 Hours Prior to Your Surgery.                 Do not use any chewable tobacco products for at least 6 hours prior to                 Surgery.  _X__  5.  Do not use any recreational drugs (marijuana, cocaine, heroin, ecstasy, MDMA or other)                For at least one week prior to your surgery.  Combination of these drugs with anesthesia                May have life threatening results.  ____  6.  Bring all medications with you on the day of surgery if instructed.   __X_ 7.  Notify your doctor if there is any change in your medical condition      (cold, fever, infections).     Do not wear jewelry, make-up, hairpins, clips or nail polish. Do not wear lotions, powders, or perfumes. You may wear deodorant. Do not shave 48 hours prior to surgery. Men may shave face and neck. Do not bring valuables to the hospital.    [facility] is not responsible for any belongings or valuables.  Contacts, dentures or bridgework may not be worn into  surgery. Leave your suitcase in the car. After surgery it may be brought to your room. For patients admitted to the hospital, discharge time is determined by your treatment team.   Patients discharged the day of surgery will not be allowed to drive home.   Make arrangements for someone to be with you for the first 24 hours of your Same Day Discharge.   __X__ Take these medicines the morning of surgery with A SIP OF WATER:    1. None   2.   3.   4.  5.  6.  ____ Fleet Enema (as directed)   ____ Use CHG Soap (or wipes) as directed  ____ Use Benzoyl Peroxide Gel as instructed  ____ Use inhalers on the day of surgery  __X__ Stop Dulaglutide 3 MG/0.5ML SOPN  7 days prior to surgery    ____ Take 1/2 of usual insulin dose the night before surgery. No insulin the morning  of surgery.   ____ Call your PCP, cardiologist, or Pulmonologist if taking Coumadin/Plavix/aspirin and ask when to stop before your surgery.   __X__ One Week prior to surgery- Stop Anti-inflammatories such as Ibuprofen, Aleve, Advil, Motrin, meloxicam (MOBIC), diclofenac, etodolac, ketorolac, Toradol, Daypro, piroxicam, Goody's or BC powders. OK TO USE TYLENOL IF NEEDED   __X__ Stop supplements until after surgery.    ____ Bring C-Pap to the hospital.    If you have any questions regarding your pre-procedure instructions,  Please call Pre-admit Testing at [phone]

## 2022-02-20 ENCOUNTER — Encounter
Admission: RE | Admit: 2022-02-20 | Discharge: 2022-02-20 | Disposition: A | Discharge status: Home / Self Care | Payer: Medicare HMO | Source: Ambulatory Visit | Attending: Urology | Admitting: Urology

## 2022-02-20 DIAGNOSIS — I10 Essential (primary) hypertension: Secondary | ICD-10-CM | POA: Insufficient documentation

## 2022-02-20 DIAGNOSIS — Z01810 Encounter for preprocedural cardiovascular examination: Secondary | ICD-10-CM | POA: Diagnosis present

## 2022-02-20 NOTE — Progress Notes (Signed)
  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 Nickolas Madrid, MD. In review of his medication reconciliation it was noted that patient is on a prescribed GLP-1 medication. Per guidelines issued by the American Society of Anesthesiologists (ASA), it is recommended that these medications be held for 7 days prior to the patient undergoing any type of elective surgical procedure. The patient is taking the following GLP-1 medication:  '[]'$  SEMAGLUTIDE   '[]'$  EXENATIDE  '[]'$  LIRAGLUTIDE   '[]'$  LIXISENATIDE  '[x]'$  DULAGLUTIDE     '[]'$  OTHER GLP-1 medication: _______________  Reached out to prescribing provider Netty Starring, MD) to make them aware of the guidelines from anesthesia. Given that this patient takes the prescribed GLP-1 medication for his  diabetes diagnosis, rather than for weight loss, recommendations from the prescribing provider were solicited. Prescribing provider made aware of the following so that informed decision/POC can be developed for this patient that may be taking medications belonging to these drug classes:  Oral GLP-1 medications will be held 1 day prior to surgery.  Injectable GLP-1 medications will be held 7 days prior to surgery.  Metformin is routinely held 48 hours prior to surgery due to renal concerns, potential need for contrasted imaging perioperatively, and the potential for tissue hypoxia leading to drug induced lactic  acidosis.  All SGLT2i medications are held 72 hours prior to surgery as they can be associated with the increased potential for developing euglycemic diabetic ketoacidosis (EDKA).   Impression and Plan:  Scott Buckley is on a prescribed GLP-1 medication, which induces the known side effect of decreased gastric emptying. Efforts are bring made to mitigate the risk of perioperative hyperglycemic events, as elevated blood glucose levels have been found to contribute to intra/postoperative complications. Additionally, hyperglycemic extremes can potentially necessitate the postponing of a patient's elective case in order to better optimize perioperative glycemic control, again with the aforementioned guidelines in place. With this in mind, recommendations have been sought from the prescribing provider, who has cleared patient to proceed with holding the prescribed GLP-1 as per the guidelines from the ASA.   Provider recommending: no further recommendations received from the prescribing provider.  Copy of signed clearance and recommendations placed on patient's chart for inclusion in their medical record and for review by the surgical/anesthetic team on the day of his procedure.   Honor Loh, MSN, APRN, FNP-C, CEN [facility]  Peri-operative Services Nurse Practitioner Phone: [phone] 02/20/22 2:18 PM  NOTE: This note has been prepared using Dragon dictation software. Despite my best ability to proofread, there is always the potential that unintentional transcriptional errors may still occur from this process.

## 2022-02-21 ENCOUNTER — Telehealth: Payer: Self-pay | Admitting: Urology

## 2022-02-21 NOTE — Telephone Encounter (Signed)
Bartolo Darter, daughter of pt, who lives in [place],  would like to know the follow up appt for 11/17 surgery.  Please advise  [phone]  Thank you

## 2022-02-21 NOTE — Telephone Encounter (Signed)
Spoke with pt. Daughter, all questions were answered.

## 2022-02-22 ENCOUNTER — Ambulatory Visit
Admission: RE | Admit: 2022-02-22 | Discharge: 2022-02-22 | Disposition: A | Discharge status: Home / Self Care | Payer: Medicare HMO | Source: Ambulatory Visit | Attending: Urology | Admitting: Urology

## 2022-02-22 ENCOUNTER — Other Ambulatory Visit: Payer: Self-pay

## 2022-02-22 ENCOUNTER — Ambulatory Visit: Payer: Medicare HMO | Admitting: Urgent Care

## 2022-02-22 ENCOUNTER — Encounter: Payer: Self-pay | Admitting: Urology

## 2022-02-22 ENCOUNTER — Encounter
Admission: RE | Disposition: A | Discharge status: Home / Self Care | Payer: Self-pay | Source: Ambulatory Visit | Attending: Urology

## 2022-02-22 DIAGNOSIS — Z8546 Personal history of malignant neoplasm of prostate: Secondary | ICD-10-CM | POA: Insufficient documentation

## 2022-02-22 DIAGNOSIS — N3941 Urge incontinence: Secondary | ICD-10-CM | POA: Diagnosis not present

## 2022-02-22 DIAGNOSIS — N3289 Other specified disorders of bladder: Secondary | ICD-10-CM | POA: Insufficient documentation

## 2022-02-22 DIAGNOSIS — F17290 Nicotine dependence, other tobacco product, uncomplicated: Secondary | ICD-10-CM | POA: Diagnosis not present

## 2022-02-22 DIAGNOSIS — I10 Essential (primary) hypertension: Secondary | ICD-10-CM | POA: Insufficient documentation

## 2022-02-22 DIAGNOSIS — Z7984 Long term (current) use of oral hypoglycemic drugs: Secondary | ICD-10-CM | POA: Insufficient documentation

## 2022-02-22 DIAGNOSIS — R339 Retention of urine, unspecified: Secondary | ICD-10-CM

## 2022-02-22 DIAGNOSIS — N138 Other obstructive and reflux uropathy: Secondary | ICD-10-CM | POA: Diagnosis not present

## 2022-02-22 DIAGNOSIS — N401 Enlarged prostate with lower urinary tract symptoms: Secondary | ICD-10-CM | POA: Diagnosis present

## 2022-02-22 DIAGNOSIS — E1122 Type 2 diabetes mellitus with diabetic chronic kidney disease: Secondary | ICD-10-CM

## 2022-02-22 DIAGNOSIS — Z923 Personal history of irradiation: Secondary | ICD-10-CM | POA: Diagnosis not present

## 2022-02-22 DIAGNOSIS — E119 Type 2 diabetes mellitus without complications: Secondary | ICD-10-CM | POA: Diagnosis not present

## 2022-02-22 HISTORY — PX: CYSTOSCOPY WITH INSERTION OF UROLIFT: SHX6678

## 2022-02-22 LAB — GLUCOSE, CAPILLARY
Glucose-Capillary: 131 mg/dL — ABNORMAL HIGH (ref 70–99)
Glucose-Capillary: 161 mg/dL — ABNORMAL HIGH (ref 70–99)

## 2022-02-22 SURGERY — CYSTOSCOPY WITH INSERTION OF UROLIFT
Anesthesia: Monitor Anesthesia Care | Site: Prostate

## 2022-02-22 MED ORDER — FAMOTIDINE 20 MG PO TABS
ORAL_TABLET | ORAL | Status: AC
  Administered 2022-02-22: 20 mg via ORAL
  Filled 2022-02-22: qty 1

## 2022-02-22 MED ORDER — ORAL CARE MOUTH RINSE: 15.0000 mL | Freq: Once | OROMUCOSAL | Status: AC

## 2022-02-22 MED ORDER — FENTANYL CITRATE (PF) 100 MCG/2ML IJ SOLN
INTRAMUSCULAR | Status: DC | PRN
  Administered 2022-02-22: 25 ug via INTRAVENOUS

## 2022-02-22 MED ORDER — PHENYLEPHRINE HCL-NACL 20-0.9 MG/250ML-% IV SOLN
INTRAVENOUS | Status: DC | PRN
  Administered 2022-02-22: 50 ug/min via INTRAVENOUS

## 2022-02-22 MED ORDER — SODIUM CHLORIDE 0.9 % IV SOLN: INTRAVENOUS | Status: DC

## 2022-02-22 MED ORDER — CHLORHEXIDINE GLUCONATE 0.12 % MT SOLN: 15.0000 mL | Freq: Once | OROMUCOSAL | Status: AC

## 2022-02-22 MED ORDER — FAMOTIDINE 20 MG PO TABS: 20.0000 mg | ORAL_TABLET | Freq: Once | ORAL | Status: AC

## 2022-02-22 MED ORDER — CIPROFLOXACIN IN D5W 400 MG/200ML IV SOLN
INTRAVENOUS | Status: AC
  Filled 2022-02-22: qty 200

## 2022-02-22 MED ORDER — FENTANYL CITRATE (PF) 100 MCG/2ML IJ SOLN
INTRAMUSCULAR | Status: AC
  Filled 2022-02-22: qty 2

## 2022-02-22 MED ORDER — PROPOFOL 500 MG/50ML IV EMUL
INTRAVENOUS | Status: DC | PRN
  Administered 2022-02-22: 150 ug/kg/min via INTRAVENOUS

## 2022-02-22 MED ORDER — CEFAZOLIN SODIUM-DEXTROSE 2-4 GM/100ML-% IV SOLN: 2.0000 g | INTRAVENOUS | Status: DC

## 2022-02-22 MED ORDER — OXYBUTYNIN CHLORIDE 5 MG PO TABS
5.0000 mg | ORAL_TABLET | Freq: Once | ORAL | Status: AC
  Filled 2022-02-22: qty 1

## 2022-02-22 MED ORDER — PROPOFOL 10 MG/ML IV BOLUS
INTRAVENOUS | Status: AC
  Filled 2022-02-22: qty 20

## 2022-02-22 MED ORDER — OXYBUTYNIN CHLORIDE 5 MG PO TABS
ORAL_TABLET | ORAL | Status: AC
  Administered 2022-02-22: 5 mg via ORAL
  Filled 2022-02-22: qty 1

## 2022-02-22 MED ORDER — CEFAZOLIN SODIUM-DEXTROSE 2-4 GM/100ML-% IV SOLN
INTRAVENOUS | Status: AC
  Filled 2022-02-22: qty 100

## 2022-02-22 MED ORDER — STERILE WATER FOR IRRIGATION IR SOLN
Status: DC | PRN
  Administered 2022-02-22: 1000 mL

## 2022-02-22 MED ORDER — CEFAZOLIN SODIUM-DEXTROSE 2-3 GM-%(50ML) IV SOLR
INTRAVENOUS | Status: DC | PRN
  Administered 2022-02-22: 2 g via INTRAVENOUS

## 2022-02-22 MED ORDER — CIPROFLOXACIN IN D5W 400 MG/200ML IV SOLN
400.0000 mg | Freq: Once | INTRAVENOUS | Status: AC
  Administered 2022-02-22: 400 mg via INTRAVENOUS

## 2022-02-22 MED ORDER — CHLORHEXIDINE GLUCONATE 0.12 % MT SOLN
OROMUCOSAL | Status: AC
  Administered 2022-02-22: 15 mL via OROMUCOSAL
  Filled 2022-02-22: qty 15

## 2022-02-22 SURGICAL SUPPLY — 17 items
BAG DRAIN SIEMENS DORNER NS (MISCELLANEOUS) ×1 IMPLANT
BRUSH SCRUB EZ  4% CHG (MISCELLANEOUS) ×1
BRUSH SCRUB EZ 4% CHG (MISCELLANEOUS) ×1 IMPLANT
GAUZE 4X4 16PLY ~~LOC~~+RFID DBL (SPONGE) ×2 IMPLANT
GLOVE SURG UNDER POLY LF SZ7.5 (GLOVE) ×1 IMPLANT
GOWN STRL REUS W/ TWL LRG LVL3 (GOWN DISPOSABLE) ×1 IMPLANT
GOWN STRL REUS W/ TWL XL LVL3 (GOWN DISPOSABLE) ×1 IMPLANT
GOWN STRL REUS W/TWL LRG LVL3 (GOWN DISPOSABLE) ×1
GOWN STRL REUS W/TWL XL LVL3 (GOWN DISPOSABLE) ×1
KIT TURNOVER CYSTO (KITS) ×1 IMPLANT
PACK CYSTO AR (MISCELLANEOUS) ×1 IMPLANT
SET CYSTO W/LG BORE CLAMP LF (SET/KITS/TRAYS/PACK) ×1 IMPLANT
SURGILUBE 2OZ TUBE FLIPTOP (MISCELLANEOUS) IMPLANT
SYSTEM UROLIFT 2 CART W/ HNDL (Male Continence) ×1 IMPLANT
SYSTEM UROLIFT 2 CARTRIDGE (Male Continence) IMPLANT
WATER STERILE IRR 3000ML UROMA (IV SOLUTION) ×1 IMPLANT
WATER STERILE IRR 500ML POUR (IV SOLUTION) ×1 IMPLANT

## 2022-02-22 NOTE — Anesthesia Postprocedure Evaluation (Signed)
Anesthesia Post Note  Patient: Scott Buckley  Procedure(s) Performed: CYSTOSCOPY WITH INSERTION OF UROLIFT (Prostate)  Patient location during evaluation: PACU Anesthesia Type: MAC Level of consciousness: awake and alert Pain management: pain level controlled Vital Signs Assessment: post-procedure vital signs reviewed and stable Respiratory status: spontaneous breathing, nonlabored ventilation and respiratory function stable Cardiovascular status: blood pressure returned to baseline and stable Postop Assessment: no apparent nausea or vomiting Anesthetic complications: no   No notable events documented.   Last Vitals:  Vitals:   02/22/22 0930 02/22/22 1005  BP: 123/73 (!) 145/71  Pulse: 70 75  Resp: 17 14  Temp: (!) 36.3 C 36.7 C  SpO2: 100% 100%    Last Pain:  Vitals:   02/22/22 1005  TempSrc: Temporal  PainSc: 0-No pain                 [place] Ouch

## 2022-02-22 NOTE — Discharge Instructions (Signed)

## 2022-02-22 NOTE — H&P (Signed)
   02/22/22 8:10 AM   Scott Buckley 02-20-1941 [id number]  CC: BPH, history of prostate cancer  HPI: 81 year old male with worsening renal function and creatinine of 1.9, urinary urgency and incontinence likely secondary to overflow incontinence, and CT showing bilateral hydroureteronephrosis down to the bladder.  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  He failed multiple voiding trials despite Flomax and opted for UroLift.   PMH: Past Medical History:  Diagnosis Date   Aneurysm ([place])    Cancer ([place])    Diabetes mellitus without complication ([place])    Hypertension    Renal disorder     Surgical History: Past Surgical History:  Procedure Laterality Date   HERNIA REPAIR     JOINT REPLACEMENT Bilateral    hip replaced and Left knee   TOTAL HIP REVISION       Family History: Family History  Problem Relation Age of Onset   Hyperlipidemia Mother    Hypertension Mother    Diabetes Mother    Hypertension Father    Heart disease Father    Diabetes Brother     Social History:  reports that he has been smoking cigars. He has been exposed to tobacco smoke. He has never used smokeless tobacco. He reports that he does not use drugs. No history on file for alcohol use.  Physical Exam: BP 96/70   Pulse 79   Temp 97.7 F (36.5 C) (Oral)   Resp 16   Ht '6\' 2"'$  (1.88 m)   Wt 106.6 kg   SpO2 100%   BMI 30.17 kg/m    Constitutional:  Alert and oriented, No acute distress. Cardiovascular: Regular rate and rhythm Respiratory: Clear to auscultation bilaterally GI: Abdomen is soft, nontender, nondistended, no abdominal masses  Laboratory Data: Urine culture 02/15/2022 no growth  Assessment & Plan:   81 year old male with worsening renal function and creatinine of  1.9, urinary urgency and incontinence likely secondary to overflow incontinence, and CT showing bilateral hydroureteronephrosis down to the bladder.  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  He failed multiple voiding trials despite Flomax.  We discussed options including HOLEP or UroLift for outlet procedure, and he opted for UroLift.  We discussed that HOLEP would be the most likely procedure to allow him to resume spontaneous voiding, however very high risk of incontinence with his prior radiation.  Risk and benefits discussed of HOLEP first UroLift extensively, and he opted for UroLift with the understanding that he may require additional procedures, may require Foley catheter again in the future, and may ultimately require HOLEP if persistent retention and hydronephrosis.  We also discussed temporary irritative symptoms of dysuria urgency/frequency, need for temporary catheter placement.  UroLift today  Nickolas Madrid, MD 02/22/2022  [facility] [address] [place], [place] [address] [phone]

## 2022-02-22 NOTE — Op Note (Signed)
Date of procedure: 02/22/22   Preoperative diagnosis:  BPH with obstruction   Postoperative diagnosis:  Same   Procedure: UroLift (6 implants placed)   Surgeon: Nickolas Madrid, MD   Anesthesia: General   Complications: None   Intraoperative findings:  Moderate size prostate with obstructing lateral lobes, moderate to severe bladder trabeculations, no suspicious lesions Uncomplicated UroLift, wide open channel at conclusion   EBL: Minimal   Specimens: None   Drains: 20 French Foley   Indication: RUTGER SALTON is a 81 y.o. patient with distant history of prostate cancer treated with radiation, BPH and urinary retention with upstream hydronephrosis who opted for intervention with UroLift.  We had discussed alternatives including HOLEP, but he was unwilling to accept the risk of incontinence with HOLEP with his history of radiation.  After reviewing the management options for treatment, they elected to proceed with the above surgical procedure(s). We have discussed the potential benefits and risks of the procedure, side effects of the proposed treatment, the likelihood of the patient achieving the goals of the procedure, and any potential problems that might occur during the procedure or recuperation.  We specifically discussed the risks of bleeding, infection, suture/clip erosion, stone formation, potential need for additional treatments in the future, urethral/bladder/prostatic urethral injury, temporary exacerbation of urinary symptoms, incontinence, and possibility of recurrence of symptoms.  Informed consent has been obtained.   Description of procedure:   The patient was taken to the operating room and MAC was induced. SCDs were placed for DVT prophylaxis. The patient was placed in the dorsal lithotomy position, prepped and draped in the usual sterile fashion, and preoperative antibiotics(Cipro) were administered. A preoperative time-out was performed.    The 20 [place] UroLift  sheath with the visual obturator was used to intubate the meatus and normal-appearing urethra was followed proximally into the bladder.  The prostate was moderate in size with obstructing lateral lobes.  Thorough cystoscopy showed moderate to severe bladder trabeculations.  I started on the right side by placing a UroLift implant at the apex 1 cm distal to the bladder neck.  An identical implant was placed on the patient's left side.  I then moved back to the verumontanum, and an additional implant was placed on the patient's right side just proximal to the Veru, and an identical procedure was performed on the left side.  At this point I re-examined the channel with the visual obturator and there was residual obstructive dumbbell tissue bilaterally.  An additional UroLift implant was placed on the right side to address the obstructive tissue distally, as well as on the left side for the obstructive tissue in the mid prostatic fossa.  At this point the fossa was reexamined and there was an excellent anterior channel and no significant bleeding was noted.   A 20 [place] two-way Foley passed easily into the bladder with return of faint pink urine, and 10 mm were placed in the balloon.  The catheter irrigated easily with a To[place]y syringe..   Disposition: Stable to PACU   Plan: Follow-up for voiding trial on Monday Follow-up in clinic in 4 weeks with PVR  Nickolas Madrid, MD 02/22/2022

## 2022-02-22 NOTE — Anesthesia Preprocedure Evaluation (Signed)
Anesthesia Evaluation  Patient identified by MRN, date of birth, ID band Patient awake    Reviewed: Allergy & Precautions, NPO status , Patient's Chart, lab work & pertinent test results  Airway Mallampati: I  TM Distance: >3 FB Neck ROM: full    Dental  (+) Lower Dentures, Upper Dentures   Pulmonary Current Smoker and Patient abstained from smoking.   Pulmonary exam normal        Cardiovascular hypertension, Pt. on medications Normal cardiovascular exam     Neuro/Psych intracranial aneurysm   negative psych ROS   GI/Hepatic negative GI ROS, Neg liver ROS,,,  Endo/Other  diabetes, Type 2, Oral Hypoglycemic Agents    Renal/GU Renal InsufficiencyRenal disease     Musculoskeletal   Abdominal Normal abdominal exam  (+)   Peds  Hematology negative hematology ROS (+)   Anesthesia Other Findings Past Medical History: No date: Aneurysm ([facility]) No date: Cancer ([place]) No date: Diabetes mellitus without complication ([facility]) No date: Hypertension No date: Renal disorder  Past Surgical History: No date: HERNIA REPAIR No date: JOINT REPLACEMENT; Bilateral     Comment:  hip replaced and Left knee No date: TOTAL HIP REVISION  BMI    Body Mass Index: 30.17 kg/m      Reproductive/Obstetrics negative OB ROS                             Anesthesia Physical Anesthesia Plan  ASA: 3  Anesthesia Plan: MAC   Post-op Pain Management: Minimal or no pain anticipated   Induction: Intravenous  PONV Risk Score and Plan: Propofol infusion and TIVA  Airway Management Planned: Natural Airway  Additional Equipment:   Intra-op Plan:   Post-operative Plan:   Informed Consent: I have reviewed the patients History and Physical, chart, labs and discussed the procedure including the risks, benefits and alternatives for the proposed anesthesia with the patient or authorized representative who has indicated  his/her understanding and acceptance.     Dental advisory given  Plan Discussed with: Anesthesiologist, CRNA and Surgeon  Anesthesia Plan Comments:         Anesthesia Quick Evaluation

## 2022-02-22 NOTE — Transfer of Care (Signed)
Immediate Anesthesia Transfer of Care Note  Patient: Scott Buckley  Procedure(s) Performed: CYSTOSCOPY WITH INSERTION OF UROLIFT (Prostate)  Patient Location: PACU  Anesthesia Type:MAC  Level of Consciousness: sedated  Airway & Oxygen Therapy: Patient Spontanous Breathing and Patient connected to face mask oxygen  Post-op Assessment: Report given to RN and Post -op Vital signs reviewed and stable  Post vital signs: Reviewed and stable  Last Vitals:  Vitals Value Taken Time  BP    Temp    Pulse 73 02/22/22 0856  Resp 20 02/22/22 0856  SpO2 99 % 02/22/22 0856  Vitals shown include unvalidated device data.  Last Pain:  Vitals:   02/22/22 0721  TempSrc: Oral  PainSc: 4          Complications: No notable events documented.

## 2022-02-22 NOTE — Anesthesia Procedure Notes (Signed)
Date/Time: 02/22/2022 8:20 AM  Performed by: Nelda Marseille, CRNAPre-anesthesia Checklist: Patient identified, Emergency Drugs available, Suction available, Patient being monitored and Timeout performed Oxygen Delivery Method: Simple face mask

## 2022-02-25 ENCOUNTER — Encounter: Payer: Self-pay | Admitting: Urology

## 2022-02-25 ENCOUNTER — Ambulatory Visit: Payer: Medicare HMO | Admitting: Physician Assistant

## 2022-02-25 DIAGNOSIS — N138 Other obstructive and reflux uropathy: Secondary | ICD-10-CM

## 2022-02-25 DIAGNOSIS — N401 Enlarged prostate with lower urinary tract symptoms: Secondary | ICD-10-CM

## 2022-02-25 LAB — BLADDER SCAN AMB NON-IMAGING: Scan Result: 119 mL

## 2022-02-25 NOTE — Progress Notes (Signed)
Catheter Removal  Patient is present today for a catheter removal.  23m of water was drained from the balloon. A 20FR foley cath was removed from the bladder, no complications were noted. Patient tolerated well.  Performed by: CElberta Leatherwood CMA  Follow up/ Additional notes: PM PVR

## 2022-02-25 NOTE — Patient Instructions (Signed)
Congratulations on your recent Urolift procedure! As discussed in clinic today, some common postoperative findings include: Burning or pain with urination Lower abdominal/pelvic pain Blood in the urine Urinary leakage Typically, these are mild and resolve within 2-4 weeks of surgery. If you have any concerns, please contact our office.  

## 2022-02-25 NOTE — Progress Notes (Signed)
Afternoon follow-up  Patient returned to clinic this afternoon for PVR. He reports drinking approximately 36oz of fluid. He has been able to urinate. He has had urinary leakage. PVR 143m.  Results for orders placed or performed in visit on 02/25/22  BLADDER SCAN AMB NON-IMAGING  Result Value Ref Range   Scan Result 119 mL    Voiding trial passed. Counseled patient on normal postoperative findings including dysuria/pelvic pain, gross hematuria, and urinary urgency/leakage. He expressed understanding.  Follow up: 4 week postop f//u with Dr. SDiamantina Providence

## 2022-03-28 ENCOUNTER — Ambulatory Visit (INDEPENDENT_AMBULATORY_CARE_PROVIDER_SITE_OTHER): Payer: Medicare HMO | Admitting: Urology

## 2022-03-28 ENCOUNTER — Encounter: Payer: Self-pay | Admitting: Urology

## 2022-03-28 VITALS — BP 103/65 | HR 94 | Ht 75.0 in | Wt 235.0 lb

## 2022-03-28 DIAGNOSIS — N138 Other obstructive and reflux uropathy: Secondary | ICD-10-CM | POA: Diagnosis not present

## 2022-03-28 DIAGNOSIS — N401 Enlarged prostate with lower urinary tract symptoms: Secondary | ICD-10-CM

## 2022-03-28 DIAGNOSIS — R339 Retention of urine, unspecified: Secondary | ICD-10-CM

## 2022-03-28 DIAGNOSIS — Z87898 Personal history of other specified conditions: Secondary | ICD-10-CM

## 2022-03-28 LAB — BLADDER SCAN AMB NON-IMAGING

## 2022-03-28 MED ORDER — TAMSULOSIN HCL 0.4 MG PO CAPS: 0.4000 mg | ORAL_CAPSULE | Freq: Every day | ORAL | 3 refills | Status: DC

## 2022-03-28 NOTE — Progress Notes (Signed)
   03/28/2022 11:49 AM   Scott Buckley 1940-09-24 [id number]  Reason for visit: Follow up history of urinary retention, BPH/atonic bladder, history of prostate cancer  HPI: Comorbid 81 year old male with a distant history of prostate cancer treated with radiation who presented in October 2023 with overflow incontinence, worsening renal function, and PVR of 743m.  Cystoscopy showed no evidence of urethral stricture and a Foley catheter was placed.  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  He failed a voiding trial despite Flomax, and ultimately opted for UroLift.  He was unwilling to accept risk of incontinence with HOLEP with his history of radiation.  He underwent an uncomplicated UroLift on 154/65/0354with a wide open channel at the conclusion.  He passed a voiding trial 3 days later with a PVR of 1151m  He overall feels he is doing well.  He is having some mild leakage, but improved significantly from prior to surgery.  PVR today elevated at 40058mbut still improved significantly from pre-procedure.  He is unwilling to have the catheter replaced, did not tolerate catheterization well.  We had a long conversation about options.  I encouraged him to perform timed voiding, and I resumed his Flomax to see if we can get him emptying a little bit better.  Risks discussed extensively including UTI, retention, or worsening renal function.  He remains unwilling to consider catheter at this time.  -Resume Flomax, close follow-up in 2 to 3 months for repeat PVR, sooner if problems -If persistent incomplete emptying with renal dysfunction, UTIs, or retention, would need to consider either HOLEP with high risk of incontinence or suprapubic tube  BriBilley CoD  [place]uiDobsonrHolsteinC [phone]

## 2022-05-19 ENCOUNTER — Inpatient Hospital Stay
Admission: EM | Admit: 2022-05-19 | Discharge: 2022-05-21 | DRG: 872 | Disposition: A | Discharge status: Home Health | Payer: Medicare HMO | Attending: Family Medicine | Admitting: Family Medicine

## 2022-05-19 ENCOUNTER — Emergency Department: Payer: Medicare HMO

## 2022-05-19 DIAGNOSIS — E860 Dehydration: Secondary | ICD-10-CM | POA: Diagnosis present

## 2022-05-19 DIAGNOSIS — A419 Sepsis, unspecified organism: Secondary | ICD-10-CM | POA: Diagnosis not present

## 2022-05-19 DIAGNOSIS — Z79899 Other long term (current) drug therapy: Secondary | ICD-10-CM

## 2022-05-19 DIAGNOSIS — F17290 Nicotine dependence, other tobacco product, uncomplicated: Secondary | ICD-10-CM | POA: Diagnosis present

## 2022-05-19 DIAGNOSIS — I10 Essential (primary) hypertension: Secondary | ICD-10-CM | POA: Diagnosis present

## 2022-05-19 DIAGNOSIS — N189 Chronic kidney disease, unspecified: Secondary | ICD-10-CM

## 2022-05-19 DIAGNOSIS — E785 Hyperlipidemia, unspecified: Secondary | ICD-10-CM | POA: Diagnosis present

## 2022-05-19 DIAGNOSIS — I4510 Unspecified right bundle-branch block: Secondary | ICD-10-CM | POA: Diagnosis present

## 2022-05-19 DIAGNOSIS — Z882 Allergy status to sulfonamides status: Secondary | ICD-10-CM

## 2022-05-19 DIAGNOSIS — N179 Acute kidney failure, unspecified: Secondary | ICD-10-CM | POA: Diagnosis present

## 2022-05-19 DIAGNOSIS — Z888 Allergy status to other drugs, medicaments and biological substances status: Secondary | ICD-10-CM

## 2022-05-19 DIAGNOSIS — I951 Orthostatic hypotension: Secondary | ICD-10-CM | POA: Diagnosis present

## 2022-05-19 DIAGNOSIS — Z8249 Family history of ischemic heart disease and other diseases of the circulatory system: Secondary | ICD-10-CM

## 2022-05-19 DIAGNOSIS — E1122 Type 2 diabetes mellitus with diabetic chronic kidney disease: Secondary | ICD-10-CM | POA: Diagnosis present

## 2022-05-19 DIAGNOSIS — R531 Weakness: Secondary | ICD-10-CM | POA: Diagnosis present

## 2022-05-19 DIAGNOSIS — I129 Hypertensive chronic kidney disease with stage 1 through stage 4 chronic kidney disease, or unspecified chronic kidney disease: Secondary | ICD-10-CM | POA: Diagnosis present

## 2022-05-19 DIAGNOSIS — Z1152 Encounter for screening for COVID-19: Secondary | ICD-10-CM

## 2022-05-19 DIAGNOSIS — Z8546 Personal history of malignant neoplasm of prostate: Secondary | ICD-10-CM

## 2022-05-19 DIAGNOSIS — Z96643 Presence of artificial hip joint, bilateral: Secondary | ICD-10-CM | POA: Diagnosis present

## 2022-05-19 DIAGNOSIS — R109 Unspecified abdominal pain: Secondary | ICD-10-CM | POA: Diagnosis not present

## 2022-05-19 DIAGNOSIS — Z7984 Long term (current) use of oral hypoglycemic drugs: Secondary | ICD-10-CM | POA: Diagnosis not present

## 2022-05-19 DIAGNOSIS — R55 Syncope and collapse: Secondary | ICD-10-CM

## 2022-05-19 DIAGNOSIS — A09 Infectious gastroenteritis and colitis, unspecified: Secondary | ICD-10-CM | POA: Diagnosis present

## 2022-05-19 DIAGNOSIS — Z833 Family history of diabetes mellitus: Secondary | ICD-10-CM | POA: Diagnosis not present

## 2022-05-19 DIAGNOSIS — R338 Other retention of urine: Secondary | ICD-10-CM | POA: Diagnosis present

## 2022-05-19 DIAGNOSIS — R6521 Severe sepsis with septic shock: Secondary | ICD-10-CM | POA: Diagnosis present

## 2022-05-19 DIAGNOSIS — N401 Enlarged prostate with lower urinary tract symptoms: Secondary | ICD-10-CM | POA: Diagnosis present

## 2022-05-19 DIAGNOSIS — I723 Aneurysm of iliac artery: Secondary | ICD-10-CM | POA: Diagnosis present

## 2022-05-19 DIAGNOSIS — N1831 Chronic kidney disease, stage 3a: Secondary | ICD-10-CM | POA: Diagnosis present

## 2022-05-19 DIAGNOSIS — Z83438 Family history of other disorder of lipoprotein metabolism and other lipidemia: Secondary | ICD-10-CM

## 2022-05-19 DIAGNOSIS — N400 Enlarged prostate without lower urinary tract symptoms: Secondary | ICD-10-CM | POA: Diagnosis present

## 2022-05-19 DIAGNOSIS — R7989 Other specified abnormal findings of blood chemistry: Secondary | ICD-10-CM | POA: Diagnosis present

## 2022-05-19 DIAGNOSIS — N390 Urinary tract infection, site not specified: Secondary | ICD-10-CM | POA: Diagnosis present

## 2022-05-19 DIAGNOSIS — Z96652 Presence of left artificial knee joint: Secondary | ICD-10-CM | POA: Diagnosis present

## 2022-05-19 DIAGNOSIS — R6520 Severe sepsis without septic shock: Secondary | ICD-10-CM | POA: Diagnosis present

## 2022-05-19 LAB — CBC WITH DIFFERENTIAL/PLATELET
Abs Immature Granulocytes: 0.2 10*3/uL — ABNORMAL HIGH (ref 0.00–0.07)
Basophils Absolute: 0.1 10*3/uL (ref 0.0–0.1)
Basophils Relative: 0 %
Eosinophils Absolute: 0.3 10*3/uL (ref 0.0–0.5)
Eosinophils Relative: 2 %
HCT: 39.2 % (ref 39.0–52.0)
Hemoglobin: 12.4 g/dL — ABNORMAL LOW (ref 13.0–17.0)
Immature Granulocytes: 1 %
Lymphocytes Relative: 16 %
Lymphs Abs: 2.7 10*3/uL (ref 0.7–4.0)
MCH: 29.4 pg (ref 26.0–34.0)
MCHC: 31.6 g/dL (ref 30.0–36.0)
MCV: 92.9 fL (ref 80.0–100.0)
Monocytes Absolute: 0.7 10*3/uL (ref 0.1–1.0)
Monocytes Relative: 4 %
Neutro Abs: 13.3 10*3/uL — ABNORMAL HIGH (ref 1.7–7.7)
Neutrophils Relative %: 77 %
Platelets: 197 10*3/uL (ref 150–400)
RBC: 4.22 MIL/uL (ref 4.22–5.81)
RDW: 13.2 % (ref 11.5–15.5)
WBC: 17.3 10*3/uL — ABNORMAL HIGH (ref 4.0–10.5)
nRBC: 0 % (ref 0.0–0.2)

## 2022-05-19 LAB — COMPREHENSIVE METABOLIC PANEL
ALT: 12 U/L (ref 0–44)
AST: 19 U/L (ref 15–41)
Albumin: 3.3 g/dL — ABNORMAL LOW (ref 3.5–5.0)
Alkaline Phosphatase: 39 U/L (ref 38–126)
Anion gap: 11 (ref 5–15)
BUN: 21 mg/dL (ref 8–23)
CO2: 28 mmol/L (ref 22–32)
Calcium: 8.5 mg/dL — ABNORMAL LOW (ref 8.9–10.3)
Chloride: 102 mmol/L (ref 98–111)
Creatinine, Ser: 1.53 mg/dL — ABNORMAL HIGH (ref 0.61–1.24)
GFR, Estimated: 45 mL/min — ABNORMAL LOW (ref >60)
Glucose, Bld: 183 mg/dL — ABNORMAL HIGH (ref 70–99)
Potassium: 3.8 mmol/L (ref 3.5–5.1)
Sodium: 141 mmol/L (ref 135–145)
Total Bilirubin: 0.5 mg/dL (ref 0.3–1.2)
Total Protein: 6.4 g/dL — ABNORMAL LOW (ref 6.5–8.1)

## 2022-05-19 LAB — RESP PANEL BY RT-PCR (RSV, FLU A&B, COVID)  RVPGX2
Influenza A by PCR: NEGATIVE
Influenza B by PCR: NEGATIVE
Resp Syncytial Virus by PCR: NEGATIVE
SARS Coronavirus 2 by RT PCR: NEGATIVE

## 2022-05-19 LAB — URINALYSIS, ROUTINE W REFLEX MICROSCOPIC
Bacteria, UA: NONE SEEN
Bilirubin Urine: NEGATIVE
Glucose, UA: 50 mg/dL — AB
Hgb urine dipstick: NEGATIVE
Ketones, ur: NEGATIVE mg/dL
Leukocytes,Ua: NEGATIVE
Nitrite: NEGATIVE
Protein, ur: 30 mg/dL — AB
Specific Gravity, Urine: 1.038 — ABNORMAL HIGH (ref 1.005–1.030)
pH: 5 (ref 5.0–8.0)

## 2022-05-19 LAB — LACTIC ACID, PLASMA
Lactic Acid, Venous: 3.2 mmol/L (ref 0.5–1.9)
Lactic Acid, Venous: 1.9 mmol/L (ref 0.5–1.9)

## 2022-05-19 LAB — TROPONIN I (HIGH SENSITIVITY)
Troponin I (High Sensitivity): 9 ng/L (ref <18)
Troponin I (High Sensitivity): 8 ng/L (ref <18)

## 2022-05-19 LAB — LIPASE, BLOOD: Lipase: 56 U/L — ABNORMAL HIGH (ref 11–51)

## 2022-05-19 MED ORDER — SODIUM CHLORIDE 0.9 % IV BOLUS
500.0000 mL | Freq: Once | INTRAVENOUS | Status: AC
  Administered 2022-05-19: 500 mL via INTRAVENOUS

## 2022-05-19 MED ORDER — ONDANSETRON HCL 4 MG PO TABS: 4.0000 mg | ORAL_TABLET | Freq: Four times a day (QID) | ORAL | Status: DC | PRN

## 2022-05-19 MED ORDER — ENOXAPARIN SODIUM 40 MG/0.4ML IJ SOSY
40.0000 mg | PREFILLED_SYRINGE | INTRAMUSCULAR | Status: DC
  Administered 2022-05-19 – 2022-05-20 (×2): 40 mg via SUBCUTANEOUS
  Filled 2022-05-19 (×2): qty 0.4

## 2022-05-19 MED ORDER — SODIUM CHLORIDE 0.9 % IV SOLN
2.0000 g | Freq: Once | INTRAVENOUS | Status: AC
  Administered 2022-05-19: 2 g via INTRAVENOUS
  Filled 2022-05-19: qty 20

## 2022-05-19 MED ORDER — ACETAMINOPHEN 650 MG RE SUPP: 650.0000 mg | Freq: Four times a day (QID) | RECTAL | Status: DC | PRN

## 2022-05-19 MED ORDER — METRONIDAZOLE 500 MG/100ML IV SOLN
500.0000 mg | Freq: Two times a day (BID) | INTRAVENOUS | Status: DC
  Administered 2022-05-19 – 2022-05-20 (×3): 500 mg via INTRAVENOUS
  Filled 2022-05-19 (×4): qty 100

## 2022-05-19 MED ORDER — SODIUM CHLORIDE 0.9 % IV BOLUS
1600.0000 mL | Freq: Once | INTRAVENOUS | Status: AC
  Administered 2022-05-19: 1600 mL via INTRAVENOUS

## 2022-05-19 MED ORDER — TRAZODONE HCL 50 MG PO TABS: 25.0000 mg | ORAL_TABLET | Freq: Every evening | ORAL | Status: DC | PRN

## 2022-05-19 MED ORDER — SODIUM CHLORIDE 0.9 % IV SOLN: INTRAVENOUS | Status: DC

## 2022-05-19 MED ORDER — SODIUM CHLORIDE 0.9 % IV SOLN
2.0000 g | INTRAVENOUS | Status: DC
  Administered 2022-05-20: 2 g via INTRAVENOUS
  Filled 2022-05-19: qty 20

## 2022-05-19 MED ORDER — IOHEXOL 350 MG/ML SOLN
100.0000 mL | Freq: Once | INTRAVENOUS | Status: AC | PRN
  Administered 2022-05-19: 100 mL via INTRAVENOUS

## 2022-05-19 MED ORDER — SODIUM CHLORIDE 0.9 % IV BOLUS
1000.0000 mL | Freq: Once | INTRAVENOUS | Status: AC
  Administered 2022-05-19: 1000 mL via INTRAVENOUS

## 2022-05-19 MED ORDER — SODIUM CHLORIDE 0.9 % IV SOLN: 2.0000 g | INTRAVENOUS | Status: DC

## 2022-05-19 MED ORDER — ACETAMINOPHEN 325 MG PO TABS: 650.0000 mg | ORAL_TABLET | Freq: Four times a day (QID) | ORAL | Status: DC | PRN

## 2022-05-19 MED ORDER — ONDANSETRON HCL 4 MG/2ML IJ SOLN: 4.0000 mg | Freq: Four times a day (QID) | INTRAMUSCULAR | Status: DC | PRN

## 2022-05-19 NOTE — Assessment & Plan Note (Addendum)
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 - Management as above with hydration with IV normal saline. - We will follow orthostatics. - We will monitor him for arrhythmias. -We will obtain a 2D echo and bilateral carotid Doppler.

## 2022-05-19 NOTE — Assessment & Plan Note (Signed)
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 - The patient will be hydrated with IV normal saline as mentioned above. - This is likely prerenal due to volume depletion and dehydration from his diarrhea. - Will follow BMP with hydration.

## 2022-05-19 NOTE — Assessment & Plan Note (Signed)
Will hold off Zestril and HCTZ given his AKI superimposed on CKD.

## 2022-05-19 NOTE — ED Notes (Signed)
36-Culture obtained from L forearm 37- Culture obtained from R forearm Lab advised of same

## 2022-05-19 NOTE — Assessment & Plan Note (Signed)
-   The patient will be placed on supplement coverage with NovoLog. - We will continue his Amaryl while monitoring for hypoglycemia.

## 2022-05-19 NOTE — ED Provider Notes (Signed)
[facility] Provider Note    Event Date/Time   First MD Initiated Contact with Patient 05/19/22 1828     (approximate)   History   Loss of Consciousness   HPI  Scott Buckley is a 82 y.o. male with a history of type 2 diabetes, hypertension, chronic kidney disease, prostate cancer, urinary retention, and iliac artery aneurysm who presents with syncope and abdominal pain.  The patient states that he was fixing some lunch when he felt some abdominal pain and the urge to have a bowel movement.  He subsequently had 2 bowel movements.  Getting up the second time he felt very lightheaded.  He attempted to go back to the kitchen with his walker but felt like he was about to pass out and let himself down to the floor.  He did not fully lose consciousness.  Subsequently with EMS he had a syncopal episode.  He was found to be hypotensive with EMS.  The patient states that he felt nauseous but did not vomit.  He reports mild abdominal discomfort now and some increased swelling compared to baseline but no acute pain.  He no longer feels dizzy or lightheaded.  He states he was in his usual state of health before this event today.  I reviewed the past medical records.  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  His most recent inpatient admission was in October last year for generalized weakness and possible UTI.   Physical Exam   Triage Vital Signs: ED Triage Vitals  Enc Vitals Group     BP      Pulse      Resp      Temp      Temp src      SpO2      Weight      Height      Head Circumference      Peak Flow      Pain Score      Pain Loc      Pain Edu?      Excl. in [place]?     Most recent vital signs: Vitals:   05/19/22 1837  BP: 119/66  Pulse: 92  Resp: 18  Temp: 98.3 F (36.8 C)  SpO2: 100%     General: Alert and oriented, comfortable appearing. CV:  Good peripheral perfusion.  Normal heart  sounds. Resp:  Normal effort.  Lungs CTAB. Abd:  Mildly distended abdomen, soft and nontender.  No pulsatile mass. Other:  EOMI.  PERRLA.  Slightly dry mucous membranes.  Motor intact in all extremities.   ED Results / Procedures / Treatments   Labs (all labs ordered are listed, but only abnormal results are displayed) Labs Reviewed  COMPREHENSIVE METABOLIC PANEL - Abnormal; Notable for the following components:      Result Value   Glucose, Bld 183 (*)    Creatinine, Ser 1.53 (*)    Calcium 8.5 (*)    Total Protein 6.4 (*)    Albumin 3.3 (*)    GFR, Estimated 45 (*)    All other components within normal limits  CBC WITH DIFFERENTIAL/PLATELET - Abnormal; Notable for the following components:   WBC 17.3 (*)    Hemoglobin 12.4 (*)    Neutro Abs 13.3 (*)    Abs Immature Granulocytes 0.20 (*)    All other components within normal limits  LIPASE, BLOOD - Abnormal; Notable for the following components:  Lipase 56 (*)    All other components within normal limits  LACTIC ACID, PLASMA - Abnormal; Notable for the following components:   Lactic Acid, Venous 3.2 (*)    All other components within normal limits  RESP PANEL BY RT-PCR (RSV, FLU A&B, COVID)  RVPGX2  CULTURE, BLOOD (ROUTINE X 2)  CULTURE, BLOOD (ROUTINE X 2)  LACTIC ACID, PLASMA  URINALYSIS, ROUTINE W REFLEX MICROSCOPIC  TROPONIN I (HIGH SENSITIVITY)  TROPONIN I (HIGH SENSITIVITY)     EKG  ED ECG REPORT I, Arta Silence, the attending physician, personally viewed and interpreted this ECG.  Date: 05/19/2022 EKG Time: 1831 Rate: 91 Rhythm: normal sinus rhythm (incorrectly read by machine as accelerated junctional rhythm) QRS Axis: normal Intervals: RBBB ST/T Wave abnormalities: normal Narrative Interpretation: no evidence of acute ischemia    RADIOLOGY  CT chest/abdomen/pelvis: I independently viewed and interpreted the images; there are no dilated bowel loops or any free air.  There is no evidence of  extravasation from the aorta.  Radiology report indicates no acute vascular findings and no significant chest or abdominal findings.  PROCEDURES:  Critical Care performed: Yes, see critical care procedure note(s)  .Critical Care  Performed by: Arta Silence, MD Authorized by: Arta Silence, MD   Critical care provider statement:    Critical care time (minutes):  15   Critical care time was exclusive of:  Separately billable procedures and treating other patients   Critical care was necessary to treat or prevent imminent or life-threatening deterioration of the following conditions:  Circulatory failure   Critical care was time spent personally by me on the following activities:  Development of treatment plan with patient or surrogate, discussions with consultants, evaluation of patient's response to treatment, examination of patient, ordering and review of laboratory studies, ordering and review of radiographic studies, ordering and performing treatments and interventions, pulse oximetry, re-evaluation of patient's condition, review of old charts and obtaining history from patient or surrogate   Care discussed with: admitting provider      MEDICATIONS ORDERED IN ED: Medications  cefTRIAXone (ROCEPHIN) 2 g in sodium chloride 0.9 % 100 mL IVPB (0 g Intravenous Paused 05/19/22 2114)  sodium chloride 0.9 % bolus 500 mL (0 mLs Intravenous Stopped 05/19/22 1917)  sodium chloride 0.9 % bolus 1,000 mL (0 mLs Intravenous Stopped 05/19/22 2110)  iohexol (OMNIPAQUE) 350 MG/ML injection 100 mL (100 mLs Intravenous Contrast Given 05/19/22 1954)     IMPRESSION / MDM / [place] / ED COURSE  I reviewed the triage vital signs and the nursing notes.  82 year old male with PMH as noted above presents with acute onset of abdominal pain and loose bowel movements followed by a near syncopal episode and a subsequent syncopal episode.  He was initially hypotensive with EMS but his blood  pressure is now normal.  Differential diagnosis includes, but is not limited to, vasovagal episode, dehydration/hypovolemia, orthostatic hypotension, gastroenteritis, COVID-19, influenza, other infection, electrolyte abnormality, other metabolic disturbance, less likely possible vascular cause.  The patient has no active abdominal pain, pulsatile mass, and the hypotension resolved with a small fluid bolus.  This is much more consistent with vasovagal or other transient cause of syncope rather than acute blood loss due to ruptured aneurysm.  We will give another fluid bolus, obtain labs, and proceed with CT to rule out acute aortic pathology.  Patient's presentation is most consistent with acute presentation with potential threat to life or bodily function.  The patient is on the cardiac  monitor to evaluate for evidence of arrhythmia and/or significant heart rate changes.  ----------------------------------------- 9:18 PM on 05/19/2022 -----------------------------------------  The patient has remained hemodynamically stable with a normal blood pressure and appears well.  He has no significant abdominal pain at this time.  CT angio shows no acute vascular or other concerning chest or intra-abdominal findings.  Lab workup reveals elevated lactate and WBC count consistent with acute infection/sepsis.  Since there are no findings in the chest or abdomen, and the patient has a history of prior UTIs, urinary etiology is most likely.  I have ordered empiric ceftriaxone as well as fluids.  I consulted Dr. Sidney Ace from the hospitalist service; based our discussion he agrees to admit the patient.   FINAL CLINICAL IMPRESSION(S) / ED DIAGNOSES   Final diagnoses:  Abdominal pain, unspecified abdominal location  Sepsis, due to unspecified organism, unspecified whether acute organ dysfunction present [facility])  Syncope, unspecified syncope type     Rx / DC Orders   ED Discharge Orders     None         Note:  This document was prepared using Dragon voice recognition software and may include unintentional dictation errors.    Arta Silence, MD 05/19/22 2119

## 2022-05-19 NOTE — H&P (Addendum)
[place]   PATIENT NAME: Scott Buckley    MR#:  [id number]  DATE OF BIRTH:  07-28-1940  DATE OF ADMISSION:  05/19/2022  PRIMARY CARE PHYSICIAN: Donnamarie Rossetti, PA-C   Patient is coming from: Home  REQUESTING/REFERRING PHYSICIAN: Arta Silence, MD   CHIEF COMPLAINT:   Chief Complaint  Patient presents with   Loss of Consciousness    HISTORY OF PRESENT ILLNESS:  Scott Buckley is a 82 y.o. Caucasian male with medical history significant for diabetes mellitus, hypertension, stage IIIa chronic kidney disease, prostate cancer and urinary retention as well as iliac artery aneurysm who presented to the emergency room with acute onset of syncope and abdominal pain.  The patient had a presyncope after having 2 loose bowel movements with associated abdominal pain.  He later had a syncopal episode with EMS and was noted to be hypotensive.  He stated he was fixing some lunch and felt some abdominal pain and diversion of a bowel movement when he subsequently had 2 bowel movements and while getting up the second time he felt very lightheaded.  He attempted to go back to the kitchen with his walker but felt like he was about to pass out and let himself down to the floor.  He did not lose consciousness then.  While he was with EMS he felt nauseated but did not vomit.  He was having mild abdominal discomfort in the ER and mild distention compared to baseline.  No chest pain or palpitations.  No cough or wheezing or dyspnea.  No fever or chills.  He admitted to urinary frequency and urgency without dysuria or hematuria or flank pain  ED Course: When he came to the ER, heart rate was 92 and otherwise vital signs were within normal.  Labs revealed a creatinine of 1.53 total QT of 6.4 albumin 3.3 with lipase of 56.  High sensitive troponin I was 9 and later 8.  Lactic acid was 3.2 later 1.9.  CBC showed leukocytosis 17.3 with neutrophilia.  Influenza, RSV and COVID-19 PCR came back  negative.  UA came back with 6-10 WBCs, 30 protein, 50 glucose and some mucus.  EKG as reviewed by me : EKG showed X-rated junctional rhythm of 91 with right bundle branch block. Imaging: CT of the chest, abdomen and pelvis showed the following: 1. No evidence of acute aortic syndrome.  No aortic dissection. 2. 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 Recommend annual imaging followup by CTA or MRA. This recommendation follows 2010 ACCF/AHA/AATS/ACR/ASA/SCA/SCAI/SIR/STS/SVM Guidelines for the Diagnosis and Management of Patients with Thoracic Aortic Disease. Circulation. 2010; [id number]. Aortic aneurysm NOS (ICD10-I71.9) 3. Infrarenal aortoiliac vascular stent.  Lumen widely patent. 4. Mild narrowing of the left renal artery at its origin. No other significant branch vessel stenosis.   NON CTA IMPRESSION   1. No acute findings in the chest. 2. No convincing acute abnormality within the abdomen or pelvis. 3. Trace amount of ascites.  Patient was given 1.5 L bolus of IV normal saline and 2 g of IV Rocephin.  He will be admitted to a medical telemetry bed for further evaluation and management.  PAST MEDICAL HISTORY:   Past Medical History:  Diagnosis Date   Aneurysm ([place])    Cancer ([place])    Diabetes mellitus without complication ([place])    Hypertension    Renal disorder     PAST SURGICAL HISTORY:   Past Surgical History:  Procedure Laterality Date   CYSTOSCOPY WITH  INSERTION OF UROLIFT N/A 02/22/2022   Procedure: CYSTOSCOPY WITH INSERTION OF UROLIFT;  Surgeon: Billey Co, MD;  Location: [facility] ORS;  Service: Urology;  Laterality: N/A;   HERNIA REPAIR     JOINT REPLACEMENT Bilateral    hip replaced and Left knee   TOTAL HIP REVISION      SOCIAL HISTORY:   Social History   Tobacco Use   Smoking status: Every Day    Types: Cigars    Passive exposure: Current (chews cigars, but it has been weeks since last time)   Smokeless tobacco: Never   Tobacco comments:     1 cigar per day  Substance Use Topics   Alcohol use: Not on file    FAMILY HISTORY:   Family History  Problem Relation Age of Onset   Hyperlipidemia Mother    Hypertension Mother    Diabetes Mother    Hypertension Father    Heart disease Father    Diabetes Brother     DRUG ALLERGIES:   Allergies  Allergen Reactions   Metformin Diarrhea   Sulfa Antibiotics Nausea And Vomiting and Other (See Comments)    Patient states not allergic   Varicella Virus Vaccine Live Other (See Comments)    Cramps and weakness   Zoster Vac Recomb Adjuvanted     Other reaction(s): Not available    REVIEW OF SYSTEMS:   ROS As per history of present illness. All pertinent systems were reviewed above. Constitutional, HEENT, cardiovascular, respiratory, GI, GU, musculoskeletal, neuro, psychiatric, endocrine, integumentary and hematologic systems were reviewed and are otherwise negative/unremarkable except for positive findings mentioned above in the HPI.   MEDICATIONS AT HOME:   Prior to Admission medications   Medication Sig Start Date End Date Taking? Authorizing Provider  acetaminophen (TYLENOL) 650 MG CR tablet Take by mouth.    [provider]  Cholecalciferol (VITAMIN D3) 2000 units capsule Take 2,000 Units by mouth daily.    [provider]  cyanocobalamin (,VITAMIN B-12,) 1000 MCG/ML injection Inject 1,000 mcg into the muscle every 30 (thirty) days. 04/06/18   [provider]  Dulaglutide 3 MG/0.5ML SOPN Inject into the skin once a week. 11/06/21   [provider]  glimepiride (AMARYL) 4 MG tablet Take 4 mg by mouth 2 (two) times daily. 05/27/17 02/22/22  [provider]  hydrochlorothiazide (HYDRODIURIL) 25 MG tablet Take 25 mg by mouth daily.    [provider]  lisinopril (PRINIVIL,ZESTRIL) 20 MG tablet Take 20 mg by mouth daily.    [provider]  Multiple Vitamin (MULTIVITAMIN) tablet Take 1 tablet by mouth daily.     [provider]  rosuvastatin (CRESTOR) 20 MG tablet Take 20 mg by mouth daily.    [provider]  tamsulosin (FLOMAX) 0.4 MG CAPS capsule Take 1 capsule (0.4 mg total) by mouth daily after supper. 03/28/22   Billey Co, MD      VITAL SIGNS:  Blood pressure (!) 140/84, pulse 62, temperature 98 F (36.7 C), temperature source Oral, resp. rate 12, height 6' 3"$  (1.905 m), weight 105.7 kg, SpO2 94 %.  PHYSICAL EXAMINATION:  Physical Exam  GENERAL:  82 y.o.-year-old Caucasian male patient lying in the bed with no acute distress.  EYES: Pupils equal, round, reactive to light and accommodation. No scleral icterus. Extraocular muscles intact.  HEENT: Head atraumatic, normocephalic. Oropharynx and nasopharynx clear.  NECK:  Supple, no jugular venous distention. No thyroid enlargement, no tenderness.  LUNGS: Normal breath sounds bilaterally, no  wheezing, rales,rhonchi or crepitation. No use of accessory muscles of respiration.  CARDIOVASCULAR: Regular rate and rhythm, S1, S2 normal. No murmurs, rubs, or gallops.  ABDOMEN: Soft, nondistended, nontender. Bowel sounds present. No organomegaly or mass.  EXTREMITIES: No pedal edema, cyanosis, or clubbing.  NEUROLOGIC: Cranial nerves II through XII are intact. Muscle strength 5/5 in all extremities. Sensation intact. Gait not checked.  PSYCHIATRIC: The patient is alert and oriented x 3.  Normal affect and good eye contact. SKIN: No obvious rash, lesion, or ulcer.   LABORATORY PANEL:   CBC Recent Labs  Lab 05/19/22 1837  WBC 17.3*  HGB 12.4*  HCT 39.2  PLT 197   ------------------------------------------------------------------------------------------------------------------  Chemistries  Recent Labs  Lab 05/19/22 1837  NA 141  K 3.8  CL 102  CO2 28  GLUCOSE 183*  BUN 21  CREATININE 1.53*  CALCIUM 8.5*  AST 19  ALT 12  ALKPHOS 39  BILITOT 0.5    ------------------------------------------------------------------------------------------------------------------  Cardiac Enzymes No results for input(s): "TROPONINI" in the last 168 hours. ------------------------------------------------------------------------------------------------------------------  RADIOLOGY:  CT Angio Chest/Abd/Pel for Dissection W and/or Wo Contrast  Result Date: 05/19/2022 CLINICAL DATA:  Passed out in the kitchen. Patient found on stomach on arrival. Nausea without vomiting. EXAM: CT ANGIOGRAPHY CHEST, ABDOMEN AND PELVIS TECHNIQUE: Multidetector CT imaging through the chest, abdomen and pelvis was performed using the standard protocol during bolus administration of intravenous contrast. Multiplanar reconstructed images and MIPs were obtained and reviewed to evaluate the vascular anatomy. RADIATION DOSE REDUCTION: This exam was performed according to the departmental dose-optimization program which includes automated exposure control, adjustment of the mA and/or kV according to patient size and/or use of iterative reconstruction technique. CONTRAST:  172m OMNIPAQUE IOHEXOL 350 MG/ML SOLN COMPARISON:  01/09/2022. FINDINGS: CTA CHEST FINDINGS Cardiovascular: Ascending thoracic aorta dilated 2 4.3 cm. No dissection. Mild atherosclerosis. Arch branch vessels are widely patent. Heart normal in size. No pericardial effusion. Left and right coronary artery calcifications. Mediastinum/Nodes: No neck base, mediastinal or hilar masses. No enlarged lymph nodes. Trachea and esophagus are unremarkable. Lungs/Pleura: Minimal dependent lower lobe subsegmental atelectasis. Lungs otherwise clear. No pleural effusion or pneumothorax. Musculoskeletal: No fracture or acute finding. No bone lesion. No chest wall mass. Review of the MIP images confirms the above findings. CTA ABDOMEN AND PELVIS FINDINGS VASCULAR Aorta: Infrarenal aortic stent extends to the iliac vessels. Aortic lumen widely  patent. No dissection. Celiac: Patent without evidence of aneurysm, dissection, vasculitis or significant stenosis. SMA: Patent without evidence of aneurysm, dissection, vasculitis or significant stenosis. Renals: Atherosclerotic plaque at the origin of the left renal artery, narrowing estimated at 50%. Widely patent right renal artery. No aneurysm or dissection. IMA: Small IMA, likely occluded at the origin and reconstituted. Inflow: Stents extend through both common iliac arteries. No dissection or significant stenosis. Veins: No obvious venous abnormality within the limitations of this arterial phase study. Review of the MIP images confirms the above findings. NON-VASCULAR Hepatobiliary: No focal liver abnormality is seen. No gallstones, gallbladder wall thickening, or biliary dilatation. Pancreas: Unremarkable. No pancreatic ductal dilatation or surrounding inflammatory changes. Spleen: Normal in size without focal abnormality. Adrenals/Urinary Tract: Normal adrenal glands. Bilateral renal cortical thinning. Exophytic low-attenuation 1.8 cm left renal mass consistent with a cyst. No follow-up recommended. No renal stones. No hydronephrosis. Ureters normal and course and caliber. Distal ureters partly obscured by left hip arthroplasty artifact. Bladder mildly distended. Prominent wall. No convincing mass or stone. Stomach/Bowel: Stomach is unremarkable. Small bowel and colon are normal in  caliber. No wall thickening. No convincing inflammation. Normal appendix. Lymphatic: No enlarged lymph nodes. Reproductive: Prostate not well assessed due to artifact from the left hip arthroplasty. Prostate appears to be at least mildly enlarged. Other: Trace amount of ascites lies adjacent to the liver and tracks along the right pericolic gutter into the posterior pelvic recess. Musculoskeletal: No acute fracture. No bone lesion. Incompletely imaged left total hip arthroplasty appears well seated and aligned. Advanced right  hip joint arthropathic changes. Degenerative changes noted throughout the visualized spine. Review of the MIP images confirms the above findings. IMPRESSION: CTA IMPRESSION 1. No evidence of acute aortic syndrome.  No aortic dissection. 2. 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 Recommend annual imaging followup by CTA or MRA. This recommendation follows 2010 ACCF/AHA/AATS/ACR/ASA/SCA/SCAI/SIR/STS/SVM Guidelines for the Diagnosis and Management of Patients with Thoracic Aortic Disease. Circulation. 2010; [id number]. Aortic aneurysm NOS (ICD10-I71.9) 3. Infrarenal aortoiliac vascular stent.  Lumen widely patent. 4. Mild narrowing of the left renal artery at its origin. No other significant branch vessel stenosis. NON CTA IMPRESSION 1. No acute findings in the chest. 2. No convincing acute abnormality within the abdomen or pelvis. 3. Trace amount of ascites. Electronically Signed   By: Lajean Manes M.D.   On: 05/19/2022 20:14      IMPRESSION AND PLAN:  Assessment and Plan: * Sepsis due to undetermined organism [facility]) - This is likely secondary to acute enteritis and possible UTI. - He has elevated lactic acid and therefore could be having severe sepsis. - He will be admitted to a medical telemetry bed. - Will continue antibiotic therapy with IV Rocephin. - We will follow his blood cultures and leukocytosis. - He will be hydrated with IV normal saline. - Pain management will be provided for his abdominal pain.  Syncope, vasovagal - This likely neuromediated syncope and possibly due to orthostatic hypotension. - Management as above with hydration with IV normal saline. - We will follow orthostatics. - We will monitor him for arrhythmias. -We will obtain a 2D echo and bilateral carotid Doppler.  Acute kidney injury superimposed on chronic kidney disease ([place]) - This is superimposed on stage IIIa chronic kidney disease. - The patient will be hydrated with IV normal saline as mentioned  above. - This is likely prerenal due to volume depletion and dehydration from his diarrhea. - Will follow BMP with hydration.  Type 2 diabetes mellitus with chronic kidney disease, without long-term current use of insulin ([place]) - The patient will be placed on supplement coverage with NovoLog. - We will continue his Amaryl while monitoring for hypoglycemia.  Essential hypertension Will hold off Zestril and HCTZ given his AKI superimposed on CKD.  Dyslipidemia - We will continue statin therapy.  BPH (benign prostatic hyperplasia) - We will continue Flomax.   DVT prophylaxis: Lovenox.  Advanced Care Planning:  Code Status: full code.  Family Communication:  The plan of care was discussed in details with the patient (and family). I answered all questions. The patient agreed to proceed with the above mentioned plan. Further management will depend upon hospital course. Disposition Plan: Back to previous home environment Consults called: none.  All the records are reviewed and case discussed with ED provider.  Status is: Inpatient   At the time of the admission, it appears that the appropriate admission status for this patient is inpatient.  This is judged to be reasonable and necessary in order to provide the required intensity of service to ensure the patient's safety given the  presenting symptoms, physical exam findings and initial radiographic and laboratory data in the context of comorbid conditions.  The patient requires inpatient status due to high intensity of service, high risk of further deterioration and high frequency of surveillance required.  I certify that at the time of admission, it is my clinical judgment that the patient will require inpatient hospital care extending more than 2 midnights.                            Dispo: The patient is from: Home              Anticipated d/c is to: Home              Patient currently is not medically stable to d/c.               Difficult to place patient: No  Christel Mormon M.D on 05/19/2022 at 11:44 PM  [facility]   From 7 PM-7 AM, contact night-coverage [email or web address]  CC: Primary care physician; [place], [employer], PA-C

## 2022-05-19 NOTE — Assessment & Plan Note (Addendum)
-   This is likely secondary to acute enteritis and possible UTI. - He has elevated lactic acid and therefore could be having severe sepsis. - He will be admitted to a medical telemetry bed. - Will continue antibiotic therapy with IV Rocephin. - We will follow his blood cultures and leukocytosis. - He will be hydrated with IV normal saline. - Pain management will be provided for his abdominal pain.

## 2022-05-19 NOTE — ED Triage Notes (Signed)
Pt BIB EMS w c/o passing out in kitchen, ems found pt on stomach on arrival. Pt was +N, no vomiting, diaphoretic and passed out a 2nd time. EMS all reports observing 2 buldging masses on abdomen. Pt BP was 73/42, then after bolis went to 119/70.Pt received 500 ns PTA. A&Ox4 on arrival to room 4.

## 2022-05-19 NOTE — Assessment & Plan Note (Signed)
-   We will continue Flomax. 

## 2022-05-19 NOTE — Assessment & Plan Note (Signed)
-   We will continue statin therapy. 

## 2022-05-20 ENCOUNTER — Inpatient Hospital Stay: Payer: Medicare HMO

## 2022-05-20 ENCOUNTER — Other Ambulatory Visit: Payer: Self-pay

## 2022-05-20 ENCOUNTER — Inpatient Hospital Stay (HOSPITAL_COMMUNITY)
Admit: 2022-05-20 | Discharge: 2022-05-20 | Disposition: A | Discharge status: Home / Self Care | Payer: Medicare HMO | Attending: Family Medicine | Admitting: Family Medicine

## 2022-05-20 ENCOUNTER — Encounter: Payer: Self-pay | Admitting: Family Medicine

## 2022-05-20 DIAGNOSIS — R55 Syncope and collapse: Secondary | ICD-10-CM | POA: Diagnosis not present

## 2022-05-20 DIAGNOSIS — A419 Sepsis, unspecified organism: Secondary | ICD-10-CM | POA: Diagnosis not present

## 2022-05-20 LAB — CBC
HCT: 33.3 % — ABNORMAL LOW (ref 39.0–52.0)
Hemoglobin: 10.4 g/dL — ABNORMAL LOW (ref 13.0–17.0)
MCH: 29.3 pg (ref 26.0–34.0)
MCHC: 31.2 g/dL (ref 30.0–36.0)
MCV: 93.8 fL (ref 80.0–100.0)
Platelets: 149 10*3/uL — ABNORMAL LOW (ref 150–400)
RBC: 3.55 MIL/uL — ABNORMAL LOW (ref 4.22–5.81)
RDW: 13.3 % (ref 11.5–15.5)
WBC: 9.3 10*3/uL (ref 4.0–10.5)
nRBC: 0 % (ref 0.0–0.2)

## 2022-05-20 LAB — BASIC METABOLIC PANEL
Anion gap: 4 — ABNORMAL LOW (ref 5–15)
BUN: 20 mg/dL (ref 8–23)
CO2: 26 mmol/L (ref 22–32)
Calcium: 7.5 mg/dL — ABNORMAL LOW (ref 8.9–10.3)
Chloride: 110 mmol/L (ref 98–111)
Creatinine, Ser: 1.18 mg/dL (ref 0.61–1.24)
GFR, Estimated: >60 mL/min (ref >60)
Glucose, Bld: 86 mg/dL (ref 70–99)
Potassium: 3.5 mmol/L (ref 3.5–5.1)
Sodium: 140 mmol/L (ref 135–145)

## 2022-05-20 LAB — ECHOCARDIOGRAM COMPLETE
AR max vel: 2.61 cm2
AV Area VTI: 2.7 cm2
AV Area mean vel: 2.61 cm2
AV Mean grad: 6 mmHg
AV Peak grad: 11.8 mmHg
Ao pk vel: 1.72 m/s
Area-P 1/2: 4.12 cm2
Height: 75 in
MV VTI: 4.11 cm2
S' Lateral: 3.3 cm
Weight: 3728 oz

## 2022-05-20 LAB — CORTISOL-AM, BLOOD: Cortisol - AM: 14.4 ug/dL (ref 6.7–22.6)

## 2022-05-20 LAB — PROCALCITONIN: Procalcitonin: 0.33 ng/mL

## 2022-05-20 LAB — PROTIME-INR
INR: 1.2 (ref 0.8–1.2)
Prothrombin Time: 15.5 seconds — ABNORMAL HIGH (ref 11.4–15.2)

## 2022-05-20 NOTE — Plan of Care (Signed)
  Problem: Fluid Volume: Goal: Hemodynamic stability will improve Outcome: Progressing   Problem: Clinical Measurements: Goal: Diagnostic test results will improve Outcome: Progressing Goal: Signs and symptoms of infection will decrease Outcome: Progressing   Problem: Respiratory: Goal: Ability to maintain adequate ventilation will improve Outcome: Progressing   Problem: Education: Goal: Knowledge of General Education information will improve Description: Including pain rating scale, medication(s)/side effects and non-pharmacologic comfort measures Outcome: Progressing   Problem: Health Behavior/Discharge Planning: Goal: Ability to manage health-related needs will improve Outcome: Progressing   Problem: Clinical Measurements: Goal: Ability to maintain clinical measurements within normal limits will improve Outcome: Progressing Goal: Will remain free from infection Outcome: Progressing Goal: Diagnostic test results will improve Outcome: Progressing Goal: Respiratory complications will improve Outcome: Progressing Goal: Cardiovascular complication will be avoided Outcome: Progressing   Problem: Activity: Goal: Risk for activity intolerance will decrease Outcome: Progressing   Problem: Nutrition: Goal: Adequate nutrition will be maintained Outcome: Progressing

## 2022-05-20 NOTE — Progress Notes (Signed)
PROGRESS NOTE    Scott Buckley  [id number] DOB: Aug 20, 1940 DOA: 05/19/2022  PCP: Donnamarie Rossetti, PA-C   Brief Narrative:  This 82 yrs old male with medical history significant for diabetes mellitus, hypertension, stage IIIa chronic kidney disease, prostate cancer and urinary retention as well as iliac artery aneurysm who presented to the emergency room with acute onset of syncope and abdominal pain. The patient had a presyncope after having 2 loose bowel movements with associated abdominal pain.  He later had a syncopal episode with EMS and was noted to be hypotensive.  He stated he was fixing some lunch and felt some abdominal pain and diversion of a bowel movement when he subsequently had 2 bowel movements and while getting up the second time he felt very lightheaded.  He attempted to go back to the kitchen with his walker but felt like he was about to pass out and let himself down to the floor.  He did not lose consciousness then.  While he was with EMS he felt nauseated but did not vomit.  He was having mild abdominal discomfort in the ER and mild distention compared to baseline. He is admitted for further evaluation. Significant labs Creatinine 1.53, lipase 56, Lactic acid 3.2, WBC 17.3, COVID, influenza, RSV negative.  Patient was started on IV fluids and started on IV Rocephin for suspected sepsis.  Assessment & Plan:   Principal Problem:   Sepsis due to undetermined organism [facility]) Active Problems:   Syncope, vasovagal   Essential hypertension   Type 2 diabetes mellitus with chronic kidney disease, without long-term current use of insulin (HCC)   Acute kidney injury superimposed on chronic kidney disease (HCC)   Dyslipidemia   BPH (benign prostatic hyperplasia)   Abdominal pain   Syncope  Sepsis due to undetermined organism: Could be secondary to acute enteritis and possible UTI. Lactic acid 3.2, resolved with IV fluid resuscitation 1.9. He has elevated lactic acid  could be having severe sepsis. Continue empiric antibiotics with IV ceftriaxone.. Follow-up blood cultures, WBC trend Continue IV fluid resuscitation. Continue adequate pain control with pain medications.   Syncope, vasovagal: Likely neuromediated syncope and possibly due to orthostatic hypotension. Continue IV fluid resuscitation. Obtain  orthostatic hypotension. Continue telemetry. Obtain 2D echocardiograms and carotid duplex.   AKI on CKD stage IIIa: Serum creatinine 1.5 above his baseline. Continue IV fluid resuscitation. Likely prerenal due to volume depletion and dehydration from his diarrhea. Avoid nephrotoxic medications.  Type 2 diabetes: Continue NovoLog sliding scale. Continue Amaryl while monitoring for hypoglycemia.  Essential hypertension: Will hold off Zestril and HCTZ given his AKI superimposed on CKD.   Dyslipidemia Continue statin therapy.   BPH: Continue Flomax     DVT prophylaxis: Lovenox Code Status: Full code Family Communication: No family at bed side. Disposition Plan:   Status is: Inpatient Remains inpatient appropriate because: Admitted for suspected sepsis unknown etiology.    Consultants:  None  Procedures: CT A/P  Antimicrobials:  Anti-infectives (From admission, onward)    Start     Dose/Rate Route Frequency Ordered Stop   05/20/22 2200  cefTRIAXone (ROCEPHIN) 2 g in sodium chloride 0.9 % 100 mL IVPB        2 g 200 mL/hr over 30 Minutes Intravenous Every 24 hours 05/19/22 2125 05/26/22 2159   05/19/22 2130  metroNIDAZOLE (FLAGYL) IVPB 500 mg        500 mg 100 mL/hr over 60 Minutes Intravenous Every 12 hours 05/19/22 2125 05/26/22 2129  05/19/22 2100  cefTRIAXone (ROCEPHIN) 2 g in sodium chloride 0.9 % 100 mL IVPB  Status:  Discontinued        2 g 200 mL/hr over 30 Minutes Intravenous Every 24 hours 05/19/22 2055 05/19/22 2056   05/19/22 2100  cefTRIAXone (ROCEPHIN) 2 g in sodium chloride 0.9 % 100 mL IVPB        2 g 200  mL/hr over 30 Minutes Intravenous  Once 05/19/22 2056 05/19/22 2237      Subjective: Patient was seen and examined at bedside.  Overnight events noted. Patient denies any chest pain, shortness of breath, urinary symptoms.   Objective: Vitals:   05/20/22 0400 05/20/22 0546 05/20/22 0600 05/20/22 0702  BP: 123/70  (!) 166/87 (!) 130/112  Pulse: (!) 57 62 61 (!) 57  Resp:   18 18  Temp:  (!) 97.5 F (36.4 C)    TempSrc:  Temporal    SpO2: 97% 98% 100% 98%  Weight:      Height:        Intake/Output Summary (Last 24 hours) at 05/20/2022 1505 Last data filed at 05/20/2022 0307 Gross per 24 hour  Intake 1700 ml  Output --  Net 1700 ml   Filed Weights   05/19/22 1839  Weight: 105.7 kg    Examination:  General exam: Appears calm and comfortable, very deconditioned. Respiratory system: Clear to auscultation. Respiratory effort normal. RR 15. Cardiovascular system: S1 & S2 heard, regular rate and rhythm, no murmur. Gastrointestinal system: Abdomen is soft, non tender, non distended, BS + Central nervous system: Alert and oriented x 1. No focal neurological deficits. Extremities: No edema, no cyanosis, no clubbing Skin: No rashes, lesions or ulcers Psychiatry:  Mood & affect appropriate.     Data Reviewed: I have personally reviewed following labs and imaging studies  CBC: Recent Labs  Lab 05/19/22 1837 05/20/22 0441  WBC 17.3* 9.3  NEUTROABS 13.3*  --   HGB 12.4* 10.4*  HCT 39.2 33.3*  MCV 92.9 93.8  PLT 197 [id number]*   Basic Metabolic Panel: Recent Labs  Lab 05/19/22 1837 05/20/22 0441  NA 141 140  K 3.8 3.5  CL 102 110  CO2 28 26  GLUCOSE 183* 86  BUN 21 20  CREATININE 1.53* 1.18  CALCIUM 8.5* 7.5*   GFR: Estimated Creatinine Clearance: 62.4 mL/min (by C-G formula based on SCr of 1.18 mg/dL). Liver Function Tests: Recent Labs  Lab 05/19/22 1837  AST 19  ALT 12  ALKPHOS 39  BILITOT 0.5  PROT 6.4*  ALBUMIN 3.3*   Recent Labs  Lab 05/19/22 1837   LIPASE 56*   No results for input(s): "AMMONIA" in the last 168 hours. Coagulation Profile: Recent Labs  Lab 05/20/22 0441  INR 1.2   Cardiac Enzymes: No results for input(s): "CKTOTAL", "CKMB", "CKMBINDEX", "TROPONINI" in the last 168 hours. BNP (last 3 results) No results for input(s): "PROBNP" in the last 8760 hours. HbA1C: No results for input(s): "HGBA1C" in the last 72 hours. CBG: No results for input(s): "GLUCAP" in the last 168 hours. Lipid Profile: No results for input(s): "CHOL", "HDL", "LDLCALC", "TRIG", "CHOLHDL", "LDLDIRECT" in the last 72 hours. Thyroid Function Tests: No results for input(s): "TSH", "T4TOTAL", "FREET4", "T3FREE", "THYROIDAB" in the last 72 hours. Anemia Panel: No results for input(s): "VITAMINB12", "FOLATE", "FERRITIN", "TIBC", "IRON", "RETICCTPCT" in the last 72 hours. Sepsis Labs: Recent Labs  Lab 05/19/22 1837 05/19/22 2047 05/20/22 0441  PROCALCITON  --   --  0.33  LATICACIDVEN  3.2* 1.9  --     Recent Results (from the past 240 hour(s))  Resp panel by RT-PCR (RSV, Flu A&B, Covid) Anterior Nasal Swab     Status: None   Collection Time: 05/19/22  6:56 PM   Specimen: Anterior Nasal Swab  Result Value Ref Range Status   SARS Coronavirus 2 by RT PCR NEGATIVE NEGATIVE Final    Comment: (NOTE) SARS-CoV-2 target nucleic acids are NOT DETECTED.  The SARS-CoV-2 RNA is generally detectable in upper respiratory specimens during the acute phase of infection. The lowest concentration of SARS-CoV-2 viral copies this assay can detect is 138 copies/mL. A negative result does not preclude SARS-Cov-2 infection and should not be used as the sole basis for treatment or other patient management decisions. A negative result may occur with  improper specimen collection/handling, submission of specimen other than nasopharyngeal swab, presence of viral mutation(s) within the areas targeted by this assay, and inadequate number of viral copies(<138  copies/mL). A negative result must be combined with clinical observations, patient history, and epidemiological information. The expected result is Negative.  Fact Sheet for Patients:  EntrepreneurPulse.com.au  Fact Sheet for Healthcare Providers:  IncredibleEmployment.be  This test is no t yet approved or cleared by the [place] FDA and  has been authorized for detection and/or diagnosis of SARS-CoV-2 by FDA under an Emergency Use Authorization (EUA). This EUA will remain  in effect (meaning this test can be used) for the duration of the COVID-19 declaration under Section 564(b)(1) of the Act, 21 U.S.C.section 360bbb-3(b)(1), unless the authorization is terminated  or revoked sooner.       Influenza A by PCR NEGATIVE NEGATIVE Final   Influenza B by PCR NEGATIVE NEGATIVE Final    Comment: (NOTE) The Xpert Xpress SARS-CoV-2/FLU/RSV plus assay is intended as an aid in the diagnosis of influenza from Nasopharyngeal swab specimens and should not be used as a sole basis for treatment. Nasal washings and aspirates are unacceptable for Xpert Xpress SARS-CoV-2/FLU/RSV testing.  Fact Sheet for Patients: EntrepreneurPulse.com.au  Fact Sheet for Healthcare Providers: IncredibleEmployment.be  This test is not yet approved or cleared by the [place] FDA and has been authorized for detection and/or diagnosis of SARS-CoV-2 by FDA under an Emergency Use Authorization (EUA). This EUA will remain in effect (meaning this test can be used) for the duration of the COVID-19 declaration under Section 564(b)(1) of the Act, 21 U.S.C. section 360bbb-3(b)(1), unless the authorization is terminated or revoked.     Resp Syncytial Virus by PCR NEGATIVE NEGATIVE Final    Comment: (NOTE) Fact Sheet for Patients: EntrepreneurPulse.com.au  Fact Sheet for Healthcare  Providers: IncredibleEmployment.be  This test is not yet approved or cleared by the [place] FDA and has been authorized for detection and/or diagnosis of SARS-CoV-2 by FDA under an Emergency Use Authorization (EUA). This EUA will remain in effect (meaning this test can be used) for the duration of the COVID-19 declaration under Section 564(b)(1) of the Act, 21 U.S.C. section 360bbb-3(b)(1), unless the authorization is terminated or revoked.  Performed at [facility], [address] [place], [place] [address]   Blood Culture (routine x 2)     Status: None (Preliminary result)   Collection Time: 05/19/22 10:04 PM   Specimen: BLOOD LEFT FOREARM  Result Value Ref Range Status   Specimen Description BLOOD LEFT FOREARM  Final   Special Requests   Final    BOTTLES DRAWN AEROBIC AND ANAEROBIC Blood Culture adequate volume   Culture  Final    NO GROWTH < 12 HOURS Performed at [facility], [address] [place], [place] [address]    Report Status PENDING  Incomplete  Blood Culture (routine x 2)     Status: None (Preliminary result)   Collection Time: 05/19/22 10:05 PM   Specimen: BLOOD RIGHT FOREARM  Result Value Ref Range Status   Specimen Description BLOOD RIGHT FOREARM  Final   Special Requests   Final    BOTTLES DRAWN AEROBIC AND ANAEROBIC Blood Culture adequate volume   Culture   Final    NO GROWTH < 12 HOURS Performed at [facility], [address] [place], [place] [address]    Report Status PENDING  Incomplete    Radiology Studies: ECHOCARDIOGRAM COMPLETE  Result Date: 05/20/2022    ECHOCARDIOGRAM REPORT   Patient Name:   DEVANTE PENNELLA Date of Exam: 05/20/2022 Medical Rec #:  [id number]       Height:       75.0 in Accession #:    [id number]      Weight:       233.0 lb Date of Birth:  [birth year]        BSA:          2.342 m Patient Age:    1 years        BP:           130/112 mmHg Patient Gender: M               HR:            68 bpm. Exam Location:  [facility] Procedure: 2D Echo, Cardiac Doppler and Color Doppler Indications:     Syncope  History:         Patient has no prior history of Echocardiogram examinations.                  Signs/Symptoms:Syncope; Risk Factors:Hypertension, Diabetes and                  Dyslipidemia. Sepsis.  Sonographer:     Wenda Low Referring Phys:  [id number] [place] Diagnosing Phys: Kathlyn Sacramento MD IMPRESSIONS  1. Left ventricular ejection fraction, by estimation, is 60 to 65%. The left ventricle has normal function. The left ventricle has no regional wall motion abnormalities. There is mild left ventricular hypertrophy. Left ventricular diastolic parameters are consistent with Grade I diastolic dysfunction (impaired relaxation).  2. Right ventricular systolic function is normal. The right ventricular size is normal. There is normal pulmonary artery systolic pressure.  3. Left atrial size was mildly dilated.  4. Right atrial size was mildly dilated.  5. The mitral valve is normal in structure. Mild mitral valve regurgitation. No evidence of mitral stenosis.  6. The aortic valve is normal in structure. Aortic valve regurgitation is mild. Aortic valve sclerosis/calcification is present, without any evidence of aortic stenosis.  7. Aortic dilatation noted. There is mild dilatation of the aortic root, measuring 42 mm.  8. The inferior vena cava is dilated in size with >50% respiratory variability, suggesting right atrial pressure of 8 mmHg. FINDINGS  Left Ventricle: Left ventricular ejection fraction, by estimation, is 60 to 65%. The left ventricle has normal function. The left ventricle has no regional wall motion abnormalities. The left ventricular internal cavity size was normal in size. There is  mild left ventricular hypertrophy. Left ventricular diastolic parameters are consistent with Grade I diastolic dysfunction (impaired relaxation). Right Ventricle: The right ventricular  size is normal. No  increase in right ventricular wall thickness. Right ventricular systolic function is normal. There is normal pulmonary artery systolic pressure. The tricuspid regurgitant velocity is 2.64 m/s, and  with an assumed right atrial pressure of 8 mmHg, the estimated right ventricular systolic pressure is [id number]. Left Atrium: Left atrial size was mildly dilated. Right Atrium: Right atrial size was mildly dilated. Pericardium: There is no evidence of pericardial effusion. Mitral Valve: The mitral valve is normal in structure. Mild mitral valve regurgitation. No evidence of mitral valve stenosis. MV peak gradient, 4.9 mmHg. The mean mitral valve gradient is 2.0 mmHg. Tricuspid Valve: The tricuspid valve is normal in structure. Tricuspid valve regurgitation is not demonstrated. No evidence of tricuspid stenosis. Aortic Valve: The aortic valve is normal in structure. Aortic valve regurgitation is mild. Aortic valve sclerosis/calcification is present, without any evidence of aortic stenosis. Aortic valve mean gradient measures 6.0 mmHg. Aortic valve peak gradient measures 11.8 mmHg. Aortic valve area, by VTI measures 2.70 cm. Pulmonic Valve: The pulmonic valve was normal in structure. Pulmonic valve regurgitation is mild. No evidence of pulmonic stenosis. Aorta: Aortic dilatation noted. There is mild dilatation of the aortic root, measuring 42 mm. Venous: The inferior vena cava is dilated in size with greater than 50% respiratory variability, suggesting right atrial pressure of 8 mmHg. IAS/Shunts: No atrial level shunt detected by color flow Doppler.  LEFT VENTRICLE PLAX 2D LVIDd:         5.10 cm   Diastology LVIDs:         3.30 cm   LV e' medial:    7.18 cm/s LV PW:         1.30 cm   LV E/e' medial:  9.7 LV IVS:        1.40 cm   LV e' lateral:   6.53 cm/s LVOT diam:     2.20 cm   LV E/e' lateral: 10.7 LV SV:         112 LV SV Index:   48 LVOT Area:     3.80 cm  RIGHT VENTRICLE RV Basal diam:  3.85 cm RV Mid diam:    3.80  cm RV S prime:     13.40 cm/s TAPSE (M-mode): 2.8 cm LEFT ATRIUM             Index        RIGHT ATRIUM           Index LA diam:        4.50 cm 1.92 cm/m   RA Area:     24.30 cm LA Vol (A2C):   82.4 ml 35.19 ml/m  RA Volume:   68.50 ml  29.25 ml/m LA Vol (A4C):   71.9 ml 30.71 ml/m LA Biplane Vol: 81.8 ml 34.93 ml/m  AORTIC VALVE                     PULMONIC VALVE AV Area (Vmax):    2.61 cm      PV Vmax:       1.05 m/s AV Area (Vmean):   2.61 cm      PV Peak grad:  4.4 mmHg AV Area (VTI):     2.70 cm AV Vmax:           172.00 cm/s AV Vmean:          114.000 cm/s AV VTI:            0.414 m AV  Peak Grad:      11.8 mmHg AV Mean Grad:      6.0 mmHg LVOT Vmax:         118.00 cm/s LVOT Vmean:        78.200 cm/s LVOT VTI:          0.294 m LVOT/AV VTI ratio: 0.71  AORTA Ao Root diam: 4.20 cm Ao Asc diam:  4.30 cm MITRAL VALVE                TRICUSPID VALVE MV Area (PHT): 4.12 cm     TR Peak grad:   27.9 mmHg MV Area VTI:   4.11 cm     TR Vmax:        264.00 cm/s MV Peak grad:  4.9 mmHg MV Mean grad:  2.0 mmHg     SHUNTS MV Vmax:       1.11 m/s     Systemic VTI:  0.29 m MV Vmean:      60.5 cm/s    Systemic Diam: 2.20 cm MV Decel Time: 184 msec MV E velocity: 69.70 cm/s MV A velocity: 100.00 cm/s MV E/A ratio:  0.70 Kathlyn Sacramento MD Electronically signed by Kathlyn Sacramento MD Signature Date/Time: 05/20/2022/2:22:25 PM    Final    US Carotid Bilateral  Result Date: 05/20/2022 CLINICAL DATA:  Initial evaluation for syncope. EXAM: BILATERAL CAROTID DUPLEX ULTRASOUND TECHNIQUE: Pearline Cables scale imaging, color Doppler and duplex ultrasound were performed of bilateral carotid and vertebral arteries in the neck. COMPARISON:  None Available. FINDINGS: Criteria: Quantification of carotid stenosis is based on velocity parameters that correlate the residual internal carotid diameter with NASCET-based stenosis levels, using the diameter of the distal internal carotid lumen as the denominator for stenosis measurement. The following  velocity measurements were obtained: RIGHT ICA: 74/28 cm/sec CCA: [id number] cm/sec SYSTOLIC ICA/CCA RATIO:  1.2 ECA: 76 cm/sec LEFT ICA: 77/23 cm/sec CCA: [id number] cm/sec SYSTOLIC ICA/CCA RATIO:  1.3 ECA: 63 cm/sec RIGHT CAROTID ARTERY: Mild smooth intimal thickening present within the visualized right CCA without stenosis. Mild heterogeneous echogenic plaque about the right carotid bulb, but with no elevation in peak systolic velocity to suggest hemodynamically significant greater than 50% stenosis. Visualized right ICA patent distally. RIGHT VERTEBRAL ARTERY:  Patent with antegrade flow. LEFT CAROTID ARTERY: Mild smooth intimal thickening within the visualized left CCA without stenosis. Heterogeneous echogenic plaque about the left carotid bulb, but with no elevation in peak systolic velocity to suggest hemodynamically significant greater than 50% stenosis. Visualized left ICA widely patent distally. LEFT VERTEBRAL ARTERY:  Patent with antegrade flow. IMPRESSION: 1. Mild to moderate atherosclerotic plaque about the carotid bulbs bilaterally, but with no sonographic evidence for hemodynamically significant greater than 50% stenosis. 2. Patent antegrade flow within both vertebral arteries within the neck. Electronically Signed   By: Jeannine Boga M.D.   On: 05/20/2022 02:06   CT Angio Chest/Abd/Pel for Dissection W and/or Wo Contrast  Result Date: 05/19/2022 CLINICAL DATA:  Passed out in the kitchen. Patient found on stomach on arrival. Nausea without vomiting. EXAM: CT ANGIOGRAPHY CHEST, ABDOMEN AND PELVIS TECHNIQUE: Multidetector CT imaging through the chest, abdomen and pelvis was performed using the standard protocol during bolus administration of intravenous contrast. Multiplanar reconstructed images and MIPs were obtained and reviewed to evaluate the vascular anatomy. RADIATION DOSE REDUCTION: This exam was performed according to the departmental dose-optimization program which includes automated exposure  control, adjustment of the mA and/or kV according to patient size and/or use of iterative  reconstruction technique. CONTRAST:  165m OMNIPAQUE IOHEXOL 350 MG/ML SOLN COMPARISON:  01/09/2022. FINDINGS: CTA CHEST FINDINGS Cardiovascular: Ascending thoracic aorta dilated 2 4.3 cm. No dissection. Mild atherosclerosis. Arch branch vessels are widely patent. Heart normal in size. No pericardial effusion. Left and right coronary artery calcifications. Mediastinum/Nodes: No neck base, mediastinal or hilar masses. No enlarged lymph nodes. Trachea and esophagus are unremarkable. Lungs/Pleura: Minimal dependent lower lobe subsegmental atelectasis. Lungs otherwise clear. No pleural effusion or pneumothorax. Musculoskeletal: No fracture or acute finding. No bone lesion. No chest wall mass. Review of the MIP images confirms the above findings. CTA ABDOMEN AND PELVIS FINDINGS VASCULAR Aorta: Infrarenal aortic stent extends to the iliac vessels. Aortic lumen widely patent. No dissection. Celiac: Patent without evidence of aneurysm, dissection, vasculitis or significant stenosis. SMA: Patent without evidence of aneurysm, dissection, vasculitis or significant stenosis. Renals: Atherosclerotic plaque at the origin of the left renal artery, narrowing estimated at 50%. Widely patent right renal artery. No aneurysm or dissection. IMA: Small IMA, likely occluded at the origin and reconstituted. Inflow: Stents extend through both common iliac arteries. No dissection or significant stenosis. Veins: No obvious venous abnormality within the limitations of this arterial phase study. Review of the MIP images confirms the above findings. NON-VASCULAR Hepatobiliary: No focal liver abnormality is seen. No gallstones, gallbladder wall thickening, or biliary dilatation. Pancreas: Unremarkable. No pancreatic ductal dilatation or surrounding inflammatory changes. Spleen: Normal in size without focal abnormality. Adrenals/Urinary Tract: Normal adrenal  glands. Bilateral renal cortical thinning. Exophytic low-attenuation 1.8 cm left renal mass consistent with a cyst. No follow-up recommended. No renal stones. No hydronephrosis. Ureters normal and course and caliber. Distal ureters partly obscured by left hip arthroplasty artifact. Bladder mildly distended. Prominent wall. No convincing mass or stone. Stomach/Bowel: Stomach is unremarkable. Small bowel and colon are normal in caliber. No wall thickening. No convincing inflammation. Normal appendix. Lymphatic: No enlarged lymph nodes. Reproductive: Prostate not well assessed due to artifact from the left hip arthroplasty. Prostate appears to be at least mildly enlarged. Other: Trace amount of ascites lies adjacent to the liver and tracks along the right pericolic gutter into the posterior pelvic recess. Musculoskeletal: No acute fracture. No bone lesion. Incompletely imaged left total hip arthroplasty appears well seated and aligned. Advanced right hip joint arthropathic changes. Degenerative changes noted throughout the visualized spine. Review of the MIP images confirms the above findings. IMPRESSION: CTA IMPRESSION 1. No evidence of acute aortic syndrome.  No aortic dissection. 2. 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 Recommend annual imaging followup by CTA or MRA. This recommendation follows 2010 ACCF/AHA/AATS/ACR/ASA/SCA/SCAI/SIR/STS/SVM Guidelines for the Diagnosis and Management of Patients with Thoracic Aortic Disease. Circulation. 2010; 121:[id number] Aortic aneurysm NOS (ICD10-I71.9) 3. Infrarenal aortoiliac vascular stent.  Lumen widely patent. 4. Mild narrowing of the left renal artery at its origin. No other significant branch vessel stenosis. NON CTA IMPRESSION 1. No acute findings in the chest. 2. No convincing acute abnormality within the abdomen or pelvis. 3. Trace amount of ascites. Electronically Signed   By: DLajean ManesM.D.   On: 05/19/2022 20:14    Scheduled Meds:  enoxaparin  (LOVENOX) injection  40 mg Subcutaneous Q24H   Continuous Infusions:  sodium chloride 100 mL/hr (05/20/22 0648)   cefTRIAXone (ROCEPHIN)  IV     metronidazole 500 mg (05/20/22 0915)     LOS: 1 day    Time spent: 50 mins    Ladarion Munyon, MD [facility]   If 7PM-7AM, please contact night-coverage

## 2022-05-20 NOTE — ED Notes (Signed)
Report messaged to Hanley Ben rn floor nurse

## 2022-05-20 NOTE — Progress Notes (Signed)
*  PRELIMINARY RESULTS* Echocardiogram 2D Echocardiogram has been performed.  Scott Buckley 05/20/2022, 11:42 AM

## 2022-05-21 DIAGNOSIS — A419 Sepsis, unspecified organism: Secondary | ICD-10-CM | POA: Diagnosis not present

## 2022-05-21 MED ORDER — LISINOPRIL 20 MG PO TABS
20.0000 mg | ORAL_TABLET | Freq: Every day | ORAL | Status: DC
  Administered 2022-05-21: 20 mg via ORAL
  Filled 2022-05-21: qty 1

## 2022-05-21 NOTE — Plan of Care (Signed)
  Problem: Respiratory: Goal: Ability to maintain adequate ventilation will improve Outcome: Progressing   Problem: Education: Goal: Knowledge of General Education information will improve Description: Including pain rating scale, medication(s)/side effects and non-pharmacologic comfort measures Outcome: Progressing   Problem: Health Behavior/Discharge Planning: Goal: Ability to manage health-related needs will improve Outcome: Progressing   Problem: Clinical Measurements: Goal: Cardiovascular complication will be avoided Outcome: Progressing   Problem: Activity: Goal: Risk for activity intolerance will decrease Outcome: Progressing   Problem: Pain Managment: Goal: General experience of comfort will improve Outcome: Progressing   Problem: Safety: Goal: Ability to remain free from injury will improve Outcome: Progressing

## 2022-05-21 NOTE — Evaluation (Signed)
Occupational Therapy Evaluation Patient Details Name: Scott Buckley MRN: [id number] DOB: 07/29/1940 Today's Date: 05/21/2022   History of Present Illness CARVEL RAYNARD is a 82 y.o. Caucasian male with medical history significant for diabetes mellitus, hypertension, stage IIIa chronic kidney disease, prostate cancer and urinary retention as well as iliac artery aneurysm who presented to the emergency room with acute onset of syncope and abdominal pain.   Clinical Impression   Mr Benoit was seen for OT evaluation this date. Prior to hospital admission, pt was MOD I using RW. Pt lives with spouse. Pt presents to acute OT demonstrating impaired ADL performance and functional mobility 2/2 decreased activity tolerance and functional ROM/balance deficits. Pt currently requires MAX A don B socks, MOD I to doff seated. SUPERVISION + RW toilet t/f and functional reaching task, requires single UE support. All education complete, will sign off. Upon hospital discharge, recommend HHOT to maximize pt safety and return to PLOF.    Recommendations for follow up therapy are one component of a multi-disciplinary discharge planning process, led by the attending physician.  Recommendations may be updated based on patient status, additional functional criteria and insurance authorization.   Follow Up Recommendations  Home health OT     Assistance Recommended at Discharge Set up Supervision/Assistance  Patient can return home with the following A little help with bathing/dressing/bathroom;Help with stairs or ramp for entrance    Functional Status Assessment  Patient has had a recent decline in their functional status and demonstrates the ability to make significant improvements in function in a reasonable and predictable amount of time.  Equipment Recommendations  None recommended by OT    Recommendations for Other Services       Precautions / Restrictions Precautions Precautions:  None Restrictions Weight Bearing Restrictions: No      Mobility Bed Mobility               General bed mobility comments: received sitting    Transfers Overall transfer level: Needs assistance Equipment used: Rolling walker (2 wheels) Transfers: Sit to/from Stand Sit to Stand: Min guard                  Balance Overall balance assessment: Needs assistance Sitting-balance support: No upper extremity supported, Feet supported Sitting balance-Leahy Scale: Good     Standing balance support: Single extremity supported Standing balance-Leahy Scale: Good                             ADL either performed or assessed with clinical judgement   ADL Overall ADL's : Needs assistance/impaired                                       General ADL Comments: MAX A don B socks, MOD I to doff seated. SUPERVISION + RW toilet t/f and functional reaching task, requires single UE support      Pertinent Vitals/Pain Pain Assessment Pain Assessment: No/denies pain     Hand Dominance Right   Extremity/Trunk Assessment Upper Extremity Assessment Upper Extremity Assessment: LUE deficits/detail LUE Deficits / Details: chronic limited AROM shoulder flexion   Lower Extremity Assessment Lower Extremity Assessment: Generalized weakness       Communication Communication Communication: No difficulties   Cognition Arousal/Alertness: Awake/alert Behavior During Therapy: WFL for tasks assessed/performed Overall Cognitive Status: Within Functional Limits for tasks assessed  Home Living Family/patient expects to be discharged to:: Private residence Living Arrangements: Spouse/significant other;Other relatives Available Help at Discharge: Family;Available 24 hours/day Type of Home: House Home Access: Stairs to enter [employer] of Steps: 1 Entrance Stairs-Rails: Right Home  Layout: Two level;Able to live on main level with bedroom/bathroom     Bathroom Shower/Tub: Tub/shower unit   Bathroom Toilet: Handicapped height     Home Equipment: Conservation officer, nature (2 wheels);Cane - single point;BSC/3in1;Shower seat;Grab bars - tub/shower          Prior Functioning/Environment Prior Level of Function : Independent/Modified Independent             Mobility Comments: SPC and RW depending on needs          OT Problem List: Decreased range of motion;Decreased strength;Decreased activity tolerance         OT Goals(Current goals can be found in the care plan section) Acute Rehab OT Goals Patient Stated Goal: to go home OT Goal Formulation: With patient/family Time For Goal Achievement: 06/04/22 Potential to Achieve Goals: Good   AM-PAC OT "6 Clicks" Daily Activity     Outcome Measure Help from another person eating meals?: None Help from another person taking care of personal grooming?: A Little Help from another person toileting, which includes using toliet, bedpan, or urinal?: A Little Help from another person bathing (including washing, rinsing, drying)?: A Little Help from another person to put on and taking off regular upper body clothing?: None Help from another person to put on and taking off regular lower body clothing?: A Lot 6 Click Score: 19   End of Session Equipment Utilized During Treatment: Rolling walker (2 wheels)  Activity Tolerance: Patient tolerated treatment well Patient left: in chair;with call bell/phone within reach;with family/visitor present  OT Visit Diagnosis: Unsteadiness on feet (R26.81)                Time: [id number] OT Time Calculation (min): 12 min Charges:  OT General Charges $OT Visit: 1 Visit OT Evaluation $OT Eval Low Complexity: 1 Low  Dessie Coma, M.S. OTR/L  05/21/22, 10:48 AM  ascom [phone]

## 2022-05-21 NOTE — Evaluation (Signed)
Physical Therapy Evaluation Patient Details Name: Scott Buckley MRN: [id number] DOB: 08-16-1940 Today's Date: 05/21/2022  History of Present Illness  Pt is an 82 y.o. caucasian male with medical history significant for diabetes mellitus, hypertension, stage IIIa chronic kidney disease, prostate cancer and urinary retention as well as iliac artery aneurysm who presented to the emergency room with acute onset of syncope and abdominal pain.  MD assessment includes: Sepsis due to undetermined organism, syncope, vasovagal, and AKI.   Clinical Impression  Pt was pleasant and motivated to participate during the session and put forth good effort throughout. Pt required extra time and effort with transfers but no physical assist.  Pt was able to amb 200 feet with frequent multi-modal cuing to amb closer to the RW with upright posture but was generally steady with no overt LOB.  Pt reported no adverse symptoms during the session with SpO2 and HR WNL on room air.  Pt will benefit from HHPT upon discharge to safely address deficits listed in patient problem list for decreased caregiver assistance and eventual return to PLOF.         Recommendations for follow up therapy are one component of a multi-disciplinary discharge planning process, led by the attending physician.  Recommendations may be updated based on patient status, additional functional criteria and insurance authorization.  Follow Up Recommendations Home health PT      Assistance Recommended at Discharge    Patient can return home with the following  A little help with walking and/or transfers;A little help with bathing/dressing/bathroom;Assistance with cooking/housework;Assist for transportation;Help with stairs or ramp for entrance    Equipment Recommendations None recommended by PT  Recommendations for Other Services       Functional Status Assessment Patient has had a recent decline in their functional status and demonstrates the  ability to make significant improvements in function in a reasonable and predictable amount of time.     Precautions / Restrictions Precautions Precautions: Fall Restrictions Weight Bearing Restrictions: No      Mobility  Bed Mobility               General bed mobility comments: NT, pt in recliner    Transfers Overall transfer level: Needs assistance Equipment used: Rolling walker (2 wheels) Transfers: Sit to/from Stand Sit to Stand: Supervision           General transfer comment: Min extra time and effort but no physical assist needed    Ambulation/Gait   Gait Distance (Feet): 200 Feet Assistive device: Rolling walker (2 wheels) Gait Pattern/deviations: Trunk flexed, Step-through pattern, Decreased step length - right, Decreased step length - left Gait velocity: decreased     General Gait Details: Slow cadence with mod verbal and visual cues for amb closer to the RW with upright posture  Stairs            Wheelchair Mobility    Modified Rankin (Stroke Patients Only)       Balance Overall balance assessment: Needs assistance Sitting-balance support: No upper extremity supported, Feet supported Sitting balance-Leahy Scale: Good     Standing balance support: Bilateral upper extremity supported, During functional activity Standing balance-Leahy Scale: Good                               Pertinent Vitals/Pain Pain Assessment Pain Assessment: No/denies pain    Home Living Family/patient expects to be discharged to:: Private residence Living Arrangements: Spouse/significant other;Other relatives  Available Help at Discharge: Family;Available 24 hours/day Type of Home: House Home Access: Stairs to enter Entrance Stairs-Rails: Right Entrance Stairs-Number of Steps: 1   Home Layout: Two level;Able to live on main level with bedroom/bathroom Home Equipment: Rolling Walker (2 wheels);Cane - single point;BSC/3in1;Shower seat;Grab bars  - tub/shower      Prior Function Prior Level of Function : Independent/Modified Independent             Mobility Comments: Mod Ind amb with a RW in the home and a SPC in the community, able to amb community distances, no fall history ADLs Comments: Occasional min A with donning socks and drying back after bathing     Hand Dominance   Dominant Hand: Right    Extremity/Trunk Assessment   Upper Extremity Assessment Upper Extremity Assessment: Defer to OT evaluation LUE Deficits / Details: chronic limited AROM shoulder flexion    Lower Extremity Assessment Lower Extremity Assessment: Generalized weakness       Communication   Communication: No difficulties  Cognition Arousal/Alertness: Awake/alert Behavior During Therapy: WFL for tasks assessed/performed Overall Cognitive Status: Within Functional Limits for tasks assessed                                          General Comments      Exercises Other Exercises Other Exercises: Car transfer sequencing education   Assessment/Plan    PT Assessment Patient needs continued PT services  PT Problem List Decreased strength;Decreased activity tolerance;Decreased balance;Decreased mobility;Decreased knowledge of use of DME       PT Treatment Interventions DME instruction;Gait training;Stair training;Functional mobility training;Therapeutic activities;Therapeutic exercise;Balance training;Patient/family education    PT Goals (Current goals can be found in the Care Plan section)  Acute Rehab PT Goals Patient Stated Goal: To get stronger PT Goal Formulation: With patient Time For Goal Achievement: 06/03/22 Potential to Achieve Goals: Good    Frequency Min 2X/week     Co-evaluation               AM-PAC PT "6 Clicks" Mobility  Outcome Measure Help needed turning from your back to your side while in a flat bed without using bedrails?: A Little Help needed moving from lying on your back to  sitting on the side of a flat bed without using bedrails?: A Little Help needed moving to and from a bed to a chair (including a wheelchair)?: A Little Help needed standing up from a chair using your arms (e.g., wheelchair or bedside chair)?: A Little Help needed to walk in hospital room?: A Little Help needed climbing 3-5 steps with a railing? : A Little 6 Click Score: 18    End of Session Equipment Utilized During Treatment: Gait belt Activity Tolerance: Patient tolerated treatment well Patient left: in chair;with call bell/phone within reach;with chair alarm set;with family/visitor present Nurse Communication: Mobility status PT Visit Diagnosis: Difficulty in walking, not elsewhere classified (R26.2);Muscle weakness (generalized) (M62.81)    Time: [id number] PT Time Calculation (min) (ACUTE ONLY): 41 min   Charges:   PT Evaluation $PT Eval Moderate Complexity: 1 Mod PT Treatments $Gait Training: 8-22 mins       D. Royetta Asal PT, DPT 05/21/22, 11:47 AM

## 2022-05-21 NOTE — Discharge Summary (Signed)
Physician Discharge Summary  Scott Buckley [id number] DOB: [birth year] DOA: 05/19/2022  PCP: Donnamarie Rossetti, PA-C  Admit date: 05/19/2022  Discharge date: 05/21/2022  Admitted From: Home  Disposition: Home with home services.  Recommendations for Outpatient Follow-up:  Follow up with PCP in 1-2 weeks. Please obtain BMP/CBC in one week. Advised to continue current medications as prescribed  Home Health: Home PT/OT Equipment/Devices: None  Discharge Condition: Stable CODE STATUS:Full code Diet recommendation: Heart Healthy   Brief [facility] Course: This 82 yrs old male with medical history significant for diabetes mellitus, hypertension, stage IIIa chronic kidney disease, prostate cancer and urinary retention as well as iliac artery aneurysm who presented to the emergency room with acute onset of syncope and abdominal pain. Patient had a presyncopal episode after having 2 loose bowel movements with associated abdominal pain.  He later had a syncopal episode with EMS and was noted to be hypotensive.  He stated he was fixing some lunch and felt some abdominal pain and diversion of a bowel movement when he subsequently had 2 bowel movements and while getting up the second time he felt very lightheaded.  He attempted to go back to the kitchen with his walker but felt like he was about to pass out and let himself down to the floor.  He did not lose consciousness then. While he was with EMS he felt nauseated but did not vomit.  He was having mild abdominal discomfort in the ER and mild distention compared to baseline. He was admitted for further evaluation. Significant labs Creatinine 1.53, lipase 56, Lactic acid 3.2, WBC 17.3, COVID, influenza, RSV negative.  Patient was started on IV fluids and started on IV Rocephin for suspected sepsis.  Syncope workup negative so far.  Echocardiogram shows LVEF 60 to 65%.  Carotid duplex shows less than 50% stenosis not significant.  Patient  denies any urinary symptoms.  Labs has improved.  PT and OT recommended home health services.  Patient felt better and wants to be discharged.  Patient is being discharged home.  Discharge Diagnoses:  Principal Problem:   Sepsis due to undetermined organism [facility]) Active Problems:   Syncope, vasovagal   Essential hypertension   Type 2 diabetes mellitus with chronic kidney disease, without long-term current use of insulin (HCC)   Acute kidney injury superimposed on chronic kidney disease (HCC)   Dyslipidemia   BPH (benign prostatic hyperplasia)   Abdominal pain   Syncope  Sepsis due to undetermined organism: Could be secondary to acute enteritis and possible UTI. Lactic acid 3.2, resolved with IV fluid resuscitation 1.9. He has elevated lactic acid could be having severe sepsis. Initiated on empiric antibiotics with IV ceftriaxone.. Blood cultures no growth so far.  Urine cultures contaminated. Continued on  IV fluid resuscitation. Continue adequate pain control with pain medications. Sepsis Resolved.   Syncope, vasovagal: Likely neuromediated syncope and possibly due to orthostatic hypotension. Continue IV fluid resuscitation. Continue telemetry. 2D echo and carotid duplex unremarkable.   AKI on CKD stage IIIa: > Improved. Serum creatinine 1.5 above his baseline. Continue IV fluid resuscitation. Likely prerenal due to volume depletion and dehydration from his diarrhea. Avoid nephrotoxic medications. AKI resolved   Type 2 diabetes: Continue NovoLog sliding scale. Continue Amaryl while monitoring for hypoglycemia.   Essential hypertension: Continue Zestril and HCTZ.   Dyslipidemia Continue statin therapy.   BPH: Continue Flomax  Discharge Instructions  Discharge Instructions     Call MD for:  difficulty breathing, headache or visual disturbances  Complete by: As directed    Call MD for:  persistant nausea and vomiting   Complete by: As directed    Diet - low  sodium heart healthy   Complete by: As directed    Diet Carb Modified   Complete by: As directed    Discharge instructions   Complete by: As directed    Advised to follow-up with primary care physician in 1 week. Advised to continue current medications as prescribed.   Increase activity slowly   Complete by: As directed    No wound care   Complete by: As directed       Allergies as of 05/21/2022       Reactions   Metformin Diarrhea   Sulfa Antibiotics Nausea And Vomiting, Other (See Comments)   Patient states not allergic   Varicella Virus Vaccine Live Other (See Comments)   Cramps and weakness   Zoster Vac Recomb Adjuvanted    Other reaction(s): Not available        Medication List     STOP taking these medications    cyanocobalamin 1000 MCG/ML injection Commonly known as: VITAMIN B12   Vitamin D3 50 MCG (2000 UT) Caps Generic drug: Cholecalciferol       TAKE these medications    acetaminophen 650 MG CR tablet Commonly known as: TYLENOL Take 650 mg by mouth every 8 (eight) hours as needed.   Dulaglutide 3 MG/0.5ML Sopn Inject into the skin once a week.   glimepiride 4 MG tablet Commonly known as: AMARYL Take 4 mg by mouth 2 (two) times daily.   hydrochlorothiazide 25 MG tablet Commonly known as: HYDRODIURIL Take 25 mg by mouth daily.   lisinopril 20 MG tablet Commonly known as: ZESTRIL Take 20 mg by mouth daily.   multivitamin tablet Take 1 tablet by mouth daily.   rosuvastatin 20 MG tablet Commonly known as: CRESTOR Take 20 mg by mouth daily.   tamsulosin 0.4 MG Caps capsule Commonly known as: FLOMAX Take 1 capsule (0.4 mg total) by mouth daily after supper.        Follow-up Information     Venetia Maxon, [employer], PA-C. Go on 05/24/2022.   Specialty: Family Medicine Why: Appt @ 12:00 pm - noon Contact information: [place] [address] [phone]                Allergies  Allergen Reactions    Metformin Diarrhea   Sulfa Antibiotics Nausea And Vomiting and Other (See Comments)    Patient states not allergic   Varicella Virus Vaccine Live Other (See Comments)    Cramps and weakness   Zoster Vac Recomb Adjuvanted     Other reaction(s): Not available    Consultations: None   Procedures/Studies: ECHOCARDIOGRAM COMPLETE  Result Date: 05/20/2022    ECHOCARDIOGRAM REPORT   Patient Name:   Scott Buckley Date of Exam: 05/20/2022 Medical Rec #:  [id number]       Height:       75.0 in Accession #:    [id number]      Weight:       233.0 lb Date of Birth:  05-21-1940        BSA:          2.342 m Patient Age:    4 years        BP:           130/112 mmHg Patient Gender: M  HR:           68 bpm. Exam Location:  [facility] Procedure: 2D Echo, Cardiac Doppler and Color Doppler Indications:     Syncope  History:         Patient has no prior history of Echocardiogram examinations.                  Signs/Symptoms:Syncope; Risk Factors:Hypertension, Diabetes and                  Dyslipidemia. Sepsis.  Sonographer:     Wenda Low Referring Phys:  [id number] [place] Diagnosing Phys: Kathlyn Sacramento MD IMPRESSIONS  1. Left ventricular ejection fraction, by estimation, is 60 to 65%. The left ventricle has normal function. The left ventricle has no regional wall motion abnormalities. There is mild left ventricular hypertrophy. Left ventricular diastolic parameters are consistent with Grade I diastolic dysfunction (impaired relaxation).  2. Right ventricular systolic function is normal. The right ventricular size is normal. There is normal pulmonary artery systolic pressure.  3. Left atrial size was mildly dilated.  4. Right atrial size was mildly dilated.  5. The mitral valve is normal in structure. Mild mitral valve regurgitation. No evidence of mitral stenosis.  6. The aortic valve is normal in structure. Aortic valve regurgitation is mild. Aortic valve sclerosis/calcification is present, without any  evidence of aortic stenosis.  7. Aortic dilatation noted. There is mild dilatation of the aortic root, measuring 42 mm.  8. The inferior vena cava is dilated in size with >50% respiratory variability, suggesting right atrial pressure of 8 mmHg. FINDINGS  Left Ventricle: Left ventricular ejection fraction, by estimation, is 60 to 65%. The left ventricle has normal function. The left ventricle has no regional wall motion abnormalities. The left ventricular internal cavity size was normal in size. There is  mild left ventricular hypertrophy. Left ventricular diastolic parameters are consistent with Grade I diastolic dysfunction (impaired relaxation). Right Ventricle: The right ventricular size is normal. No increase in right ventricular wall thickness. Right ventricular systolic function is normal. There is normal pulmonary artery systolic pressure. The tricuspid regurgitant velocity is 2.64 m/s, and  with an assumed right atrial pressure of 8 mmHg, the estimated right ventricular systolic pressure is [id number]. Left Atrium: Left atrial size was mildly dilated. Right Atrium: Right atrial size was mildly dilated. Pericardium: There is no evidence of pericardial effusion. Mitral Valve: The mitral valve is normal in structure. Mild mitral valve regurgitation. No evidence of mitral valve stenosis. MV peak gradient, 4.9 mmHg. The mean mitral valve gradient is 2.0 mmHg. Tricuspid Valve: The tricuspid valve is normal in structure. Tricuspid valve regurgitation is not demonstrated. No evidence of tricuspid stenosis. Aortic Valve: The aortic valve is normal in structure. Aortic valve regurgitation is mild. Aortic valve sclerosis/calcification is present, without any evidence of aortic stenosis. Aortic valve mean gradient measures 6.0 mmHg. Aortic valve peak gradient measures 11.8 mmHg. Aortic valve area, by VTI measures 2.70 cm. Pulmonic Valve: The pulmonic valve was normal in structure. Pulmonic valve regurgitation is mild.  No evidence of pulmonic stenosis. Aorta: Aortic dilatation noted. There is mild dilatation of the aortic root, measuring 42 mm. Venous: The inferior vena cava is dilated in size with greater than 50% respiratory variability, suggesting right atrial pressure of 8 mmHg. IAS/Shunts: No atrial level shunt detected by color flow Doppler.  LEFT VENTRICLE PLAX 2D LVIDd:         5.10 cm   Diastology LVIDs:  3.30 cm   LV e' medial:    7.18 cm/s LV PW:         1.30 cm   LV E/e' medial:  9.7 LV IVS:        1.40 cm   LV e' lateral:   6.53 cm/s LVOT diam:     2.20 cm   LV E/e' lateral: 10.7 LV SV:         112 LV SV Index:   48 LVOT Area:     3.80 cm  RIGHT VENTRICLE RV Basal diam:  3.85 cm RV Mid diam:    3.80 cm RV S prime:     13.40 cm/s TAPSE (M-mode): 2.8 cm LEFT ATRIUM             Index        RIGHT ATRIUM           Index LA diam:        4.50 cm 1.92 cm/m   RA Area:     24.30 cm LA Vol (A2C):   82.4 ml 35.19 ml/m  RA Volume:   68.50 ml  29.25 ml/m LA Vol (A4C):   71.9 ml 30.71 ml/m LA Biplane Vol: 81.8 ml 34.93 ml/m  AORTIC VALVE                     PULMONIC VALVE AV Area (Vmax):    2.61 cm      PV Vmax:       1.05 m/s AV Area (Vmean):   2.61 cm      PV Peak grad:  4.4 mmHg AV Area (VTI):     2.70 cm AV Vmax:           172.00 cm/s AV Vmean:          114.000 cm/s AV VTI:            0.414 m AV Peak Grad:      11.8 mmHg AV Mean Grad:      6.0 mmHg LVOT Vmax:         118.00 cm/s LVOT Vmean:        78.200 cm/s LVOT VTI:          0.294 m LVOT/AV VTI ratio: 0.71  AORTA Ao Root diam: 4.20 cm Ao Asc diam:  4.30 cm MITRAL VALVE                TRICUSPID VALVE MV Area (PHT): 4.12 cm     TR Peak grad:   27.9 mmHg MV Area VTI:   4.11 cm     TR Vmax:        264.00 cm/s MV Peak grad:  4.9 mmHg MV Mean grad:  2.0 mmHg     SHUNTS MV Vmax:       1.11 m/s     Systemic VTI:  0.29 m MV Vmean:      60.5 cm/s    Systemic Diam: 2.20 cm MV Decel Time: 184 msec MV E velocity: 69.70 cm/s MV A velocity: 100.00 cm/s MV E/A ratio:   0.70 Kathlyn Sacramento MD Electronically signed by Kathlyn Sacramento MD Signature Date/Time: 05/20/2022/2:22:25 PM    Final    US Carotid Bilateral  Result Date: 05/20/2022 CLINICAL DATA:  Initial evaluation for syncope. EXAM: BILATERAL CAROTID DUPLEX ULTRASOUND TECHNIQUE: Pearline Cables scale imaging, color Doppler and duplex ultrasound were performed of bilateral carotid and vertebral arteries in the neck. COMPARISON:  None Available. FINDINGS: Criteria: Quantification of  carotid stenosis is based on velocity parameters that correlate the residual internal carotid diameter with NASCET-based stenosis levels, using the diameter of the distal internal carotid lumen as the denominator for stenosis measurement. The following velocity measurements were obtained: RIGHT ICA: 74/28 cm/sec CCA: [id number] cm/sec SYSTOLIC ICA/CCA RATIO:  1.2 ECA: 76 cm/sec LEFT ICA: 77/23 cm/sec CCA: [id number] cm/sec SYSTOLIC ICA/CCA RATIO:  1.3 ECA: 63 cm/sec RIGHT CAROTID ARTERY: Mild smooth intimal thickening present within the visualized right CCA without stenosis. Mild heterogeneous echogenic plaque about the right carotid bulb, but with no elevation in peak systolic velocity to suggest hemodynamically significant greater than 50% stenosis. Visualized right ICA patent distally. RIGHT VERTEBRAL ARTERY:  Patent with antegrade flow. LEFT CAROTID ARTERY: Mild smooth intimal thickening within the visualized left CCA without stenosis. Heterogeneous echogenic plaque about the left carotid bulb, but with no elevation in peak systolic velocity to suggest hemodynamically significant greater than 50% stenosis. Visualized left ICA widely patent distally. LEFT VERTEBRAL ARTERY:  Patent with antegrade flow. IMPRESSION: 1. Mild to moderate atherosclerotic plaque about the carotid bulbs bilaterally, but with no sonographic evidence for hemodynamically significant greater than 50% stenosis. 2. Patent antegrade flow within both vertebral arteries within the neck.  Electronically Signed   By: Jeannine Boga M.D.   On: 05/20/2022 02:06   CT Angio Chest/Abd/Pel for Dissection W and/or Wo Contrast  Result Date: 05/19/2022 CLINICAL DATA:  Passed out in the kitchen. Patient found on stomach on arrival. Nausea without vomiting. EXAM: CT ANGIOGRAPHY CHEST, ABDOMEN AND PELVIS TECHNIQUE: Multidetector CT imaging through the chest, abdomen and pelvis was performed using the standard protocol during bolus administration of intravenous contrast. Multiplanar reconstructed images and MIPs were obtained and reviewed to evaluate the vascular anatomy. RADIATION DOSE REDUCTION: This exam was performed according to the departmental dose-optimization program which includes automated exposure control, adjustment of the mA and/or kV according to patient size and/or use of iterative reconstruction technique. CONTRAST:  113m OMNIPAQUE IOHEXOL 350 MG/ML SOLN COMPARISON:  01/09/2022. FINDINGS: CTA CHEST FINDINGS Cardiovascular: Ascending thoracic aorta dilated 2 4.3 cm. No dissection. Mild atherosclerosis. Arch branch vessels are widely patent. Heart normal in size. No pericardial effusion. Left and right coronary artery calcifications. Mediastinum/Nodes: No neck base, mediastinal or hilar masses. No enlarged lymph nodes. Trachea and esophagus are unremarkable. Lungs/Pleura: Minimal dependent lower lobe subsegmental atelectasis. Lungs otherwise clear. No pleural effusion or pneumothorax. Musculoskeletal: No fracture or acute finding. No bone lesion. No chest wall mass. Review of the MIP images confirms the above findings. CTA ABDOMEN AND PELVIS FINDINGS VASCULAR Aorta: Infrarenal aortic stent extends to the iliac vessels. Aortic lumen widely patent. No dissection. Celiac: Patent without evidence of aneurysm, dissection, vasculitis or significant stenosis. SMA: Patent without evidence of aneurysm, dissection, vasculitis or significant stenosis. Renals: Atherosclerotic plaque at the origin of  the left renal artery, narrowing estimated at 50%. Widely patent right renal artery. No aneurysm or dissection. IMA: Small IMA, likely occluded at the origin and reconstituted. Inflow: Stents extend through both common iliac arteries. No dissection or significant stenosis. Veins: No obvious venous abnormality within the limitations of this arterial phase study. Review of the MIP images confirms the above findings. NON-VASCULAR Hepatobiliary: No focal liver abnormality is seen. No gallstones, gallbladder wall thickening, or biliary dilatation. Pancreas: Unremarkable. No pancreatic ductal dilatation or surrounding inflammatory changes. Spleen: Normal in size without focal abnormality. Adrenals/Urinary Tract: Normal adrenal glands. Bilateral renal cortical thinning. Exophytic low-attenuation 1.8 cm left renal mass consistent with a cyst. No  follow-up recommended. No renal stones. No hydronephrosis. Ureters normal and course and caliber. Distal ureters partly obscured by left hip arthroplasty artifact. Bladder mildly distended. Prominent wall. No convincing mass or stone. Stomach/Bowel: Stomach is unremarkable. Small bowel and colon are normal in caliber. No wall thickening. No convincing inflammation. Normal appendix. Lymphatic: No enlarged lymph nodes. Reproductive: Prostate not well assessed due to artifact from the left hip arthroplasty. Prostate appears to be at least mildly enlarged. Other: Trace amount of ascites lies adjacent to the liver and tracks along the right pericolic gutter into the posterior pelvic recess. Musculoskeletal: No acute fracture. No bone lesion. Incompletely imaged left total hip arthroplasty appears well seated and aligned. Advanced right hip joint arthropathic changes. Degenerative changes noted throughout the visualized spine. Review of the MIP images confirms the above findings. IMPRESSION: CTA IMPRESSION 1. No evidence of acute aortic syndrome.  No aortic dissection. 2. 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 Recommend annual imaging followup by CTA or MRA. This recommendation follows 2010 ACCF/AHA/AATS/ACR/ASA/SCA/SCAI/SIR/STS/SVM Guidelines for the Diagnosis and Management of Patients with Thoracic Aortic Disease. Circulation. 2010; [id number]. Aortic aneurysm NOS (ICD10-I71.9) 3. Infrarenal aortoiliac vascular stent.  Lumen widely patent. 4. Mild narrowing of the left renal artery at its origin. No other significant branch vessel stenosis. NON CTA IMPRESSION 1. No acute findings in the chest. 2. No convincing acute abnormality within the abdomen or pelvis. 3. Trace amount of ascites. Electronically Signed   By: Lajean Manes M.D.   On: 05/19/2022 20:14    Subjective: Patient was seen and examined at bedside.  Overnight events noted.   Patient reports doing much better.  Patient wants to be discharged,  denies any urinary symptoms.  Discharge Exam: Vitals:   05/20/22 2000 05/21/22 0753  BP: (!) 153/83 (!) 186/88  Pulse: 67 63  Resp: 17 17  Temp: 98.3 F (36.8 C) 98.7 F (37.1 C)  SpO2: 95% 97%   Vitals:   05/20/22 1520 05/20/22 1540 05/20/22 2000 05/21/22 0753  BP: (!) 170/86 (!) 159/78 (!) 153/83 (!) 186/88  Pulse: (!) 59 70 67 63  Resp: 18 20 17 17  $ Temp: (!) 97.5 F (36.4 C) (!) 97.5 F (36.4 C) 98.3 F (36.8 C) 98.7 F (37.1 C)  TempSrc: Oral Oral    SpO2: 99% 97% 95% 97%  Weight:      Height:        General: Pt is alert, awake, not in acute distress Cardiovascular: RRR, S1/S2 +, no rubs, no gallops Respiratory: CTA bilaterally, no wheezing, no rhonchi Abdominal: Soft, NT, ND, bowel sounds + Extremities: no edema, no cyanosis    The results of significant diagnostics from this hospitalization (including imaging, microbiology, ancillary and laboratory) are listed below for reference.     Microbiology: Recent Results (from the past 240 hour(s))  Resp panel by RT-PCR (RSV, Flu A&B, Covid) Anterior Nasal Swab     Status: None    Collection Time: 05/19/22  6:56 PM   Specimen: Anterior Nasal Swab  Result Value Ref Range Status   SARS Coronavirus 2 by RT PCR NEGATIVE NEGATIVE Final    Comment: (NOTE) SARS-CoV-2 target nucleic acids are NOT DETECTED.  The SARS-CoV-2 RNA is generally detectable in upper respiratory specimens during the acute phase of infection. The lowest concentration of SARS-CoV-2 viral copies this assay can detect is 138 copies/mL. A negative result does not preclude SARS-Cov-2 infection and should not be used as the sole basis for treatment or other patient  management decisions. A negative result may occur with  improper specimen collection/handling, submission of specimen other than nasopharyngeal swab, presence of viral mutation(s) within the areas targeted by this assay, and inadequate number of viral copies(<138 copies/mL). A negative result must be combined with clinical observations, patient history, and epidemiological information. The expected result is Negative.  Fact Sheet for Patients:  EntrepreneurPulse.com.au  Fact Sheet for Healthcare Providers:  IncredibleEmployment.be  This test is no t yet approved or cleared by the [place] FDA and  has been authorized for detection and/or diagnosis of SARS-CoV-2 by FDA under an Emergency Use Authorization (EUA). This EUA will remain  in effect (meaning this test can be used) for the duration of the COVID-19 declaration under Section 564(b)(1) of the Act, 21 U.S.C.section 360bbb-3(b)(1), unless the authorization is terminated  or revoked sooner.       Influenza A by PCR NEGATIVE NEGATIVE Final   Influenza B by PCR NEGATIVE NEGATIVE Final    Comment: (NOTE) The Xpert Xpress SARS-CoV-2/FLU/RSV plus assay is intended as an aid in the diagnosis of influenza from Nasopharyngeal swab specimens and should not be used as a sole basis for treatment. Nasal washings and aspirates are unacceptable for  Xpert Xpress SARS-CoV-2/FLU/RSV testing.  Fact Sheet for Patients: EntrepreneurPulse.com.au  Fact Sheet for Healthcare Providers: IncredibleEmployment.be  This test is not yet approved or cleared by the [place] FDA and has been authorized for detection and/or diagnosis of SARS-CoV-2 by FDA under an Emergency Use Authorization (EUA). This EUA will remain in effect (meaning this test can be used) for the duration of the COVID-19 declaration under Section 564(b)(1) of the Act, 21 U.S.C. section 360bbb-3(b)(1), unless the authorization is terminated or revoked.     Resp Syncytial Virus by PCR NEGATIVE NEGATIVE Final    Comment: (NOTE) Fact Sheet for Patients: EntrepreneurPulse.com.au  Fact Sheet for Healthcare Providers: IncredibleEmployment.be  This test is not yet approved or cleared by the [place] FDA and has been authorized for detection and/or diagnosis of SARS-CoV-2 by FDA under an Emergency Use Authorization (EUA). This EUA will remain in effect (meaning this test can be used) for the duration of the COVID-19 declaration under Section 564(b)(1) of the Act, 21 U.S.C. section 360bbb-3(b)(1), unless the authorization is terminated or revoked.  Performed at [facility], [address] [place], [place] [address]   Culture, Urine (Do not remove urinary catheter, catheter placed by urology or difficult to place)     Status: Abnormal (Preliminary result)   Collection Time: 05/19/22  9:30 PM   Specimen: Urine, Catheterized  Result Value Ref Range Status   Specimen Description   Final    URINE, CATHETERIZED Performed at [facility], [address] [place], [place] [address]    Special Requests   Final    NONE Performed at [facility], [address] [place], [place] [address]    Culture (A)  Final    10,000 COLONIES/mL ESCHERICHIA COLI 5,000 COLONIES/mL  KLEBSIELLA OXYTOCA SUSCEPTIBILITIES TO FOLLOW Performed at [facility], [place] [address], [place], [place] [address]    Report Status PENDING  Incomplete  Blood Culture (routine x 2)     Status: None (Preliminary result)   Collection Time: 05/19/22 10:04 PM   Specimen: BLOOD LEFT FOREARM  Result Value Ref Range Status   Specimen Description BLOOD LEFT FOREARM  Final   Special Requests   Final    BOTTLES DRAWN AEROBIC AND ANAEROBIC Blood Culture adequate volume  Culture   Final    NO GROWTH 2 DAYS Performed at [facility], [address] [place], [place] [address]    Report Status PENDING  Incomplete  Blood Culture (routine x 2)     Status: None (Preliminary result)   Collection Time: 05/19/22 10:05 PM   Specimen: BLOOD RIGHT FOREARM  Result Value Ref Range Status   Specimen Description BLOOD RIGHT FOREARM  Final   Special Requests   Final    BOTTLES DRAWN AEROBIC AND ANAEROBIC Blood Culture adequate volume   Culture   Final    NO GROWTH 2 DAYS Performed at [facility], [address] [place], [place] [address]    Report Status PENDING  Incomplete     Labs: BNP (last 3 results) No results for input(s): "BNP" in the last 8760 hours. Basic Metabolic Panel: Recent Labs  Lab 05/19/22 1837 05/20/22 0441  NA 141 140  K 3.8 3.5  CL 102 110  CO2 28 26  GLUCOSE 183* 86  BUN 21 20  CREATININE 1.53* 1.18  CALCIUM 8.5* 7.5*   Liver Function Tests: Recent Labs  Lab 05/19/22 1837  AST 19  ALT 12  ALKPHOS 39  BILITOT 0.5  PROT 6.4*  ALBUMIN 3.3*   Recent Labs  Lab 05/19/22 1837  LIPASE 56*   No results for input(s): "AMMONIA" in the last 168 hours. CBC: Recent Labs  Lab 05/19/22 1837 05/20/22 0441  WBC 17.3* 9.3  NEUTROABS 13.3*  --   HGB 12.4* 10.4*  HCT 39.2 33.3*  MCV 92.9 93.8  PLT 197 149*   Cardiac Enzymes: No results for input(s): "CKTOTAL", "CKMB", "CKMBINDEX", "TROPONINI" in the last 168 hours. BNP: Invalid  input(s): "POCBNP" CBG: No results for input(s): "GLUCAP" in the last 168 hours. D-Dimer No results for input(s): "DDIMER" in the last 72 hours. Hgb A1c No results for input(s): "HGBA1C" in the last 72 hours. Lipid Profile No results for input(s): "CHOL", "HDL", "LDLCALC", "TRIG", "CHOLHDL", "LDLDIRECT" in the last 72 hours. Thyroid function studies No results for input(s): "TSH", "T4TOTAL", "T3FREE", "THYROIDAB" in the last 72 hours.  Invalid input(s): "FREET3" Anemia work up No results for input(s): "VITAMINB12", "FOLATE", "FERRITIN", "TIBC", "IRON", "RETICCTPCT" in the last 72 hours. Urinalysis    Component Value Date/Time   COLORURINE YELLOW (A) 05/19/2022 2130   APPEARANCEUR CLEAR (A) 05/19/2022 2130   APPEARANCEUR Clear 01/17/2022 1420   LABSPEC 1.038 (H) 05/19/2022 2130   LABSPEC 1.058 04/14/2014 0212   PHURINE 5.0 05/19/2022 2130   GLUCOSEU 50 (A) 05/19/2022 2130   GLUCOSEU 50 mg/dL 04/14/2014 0212   HGBUR NEGATIVE 05/19/2022 2130   BILIRUBINUR NEGATIVE 05/19/2022 2130   BILIRUBINUR Negative 01/17/2022 1420   BILIRUBINUR Negative 04/14/2014 0212   KETONESUR NEGATIVE 05/19/2022 2130   PROTEINUR 30 (A) 05/19/2022 2130   NITRITE NEGATIVE 05/19/2022 2130   LEUKOCYTESUR NEGATIVE 05/19/2022 2130   LEUKOCYTESUR Negative 04/14/2014 0212   Sepsis Labs Recent Labs  Lab 05/19/22 1837 05/20/22 0441  WBC 17.3* 9.3   Microbiology Recent Results (from the past 240 hour(s))  Resp panel by RT-PCR (RSV, Flu A&B, Covid) Anterior Nasal Swab     Status: None   Collection Time: 05/19/22  6:56 PM   Specimen: Anterior Nasal Swab  Result Value Ref Range Status   SARS Coronavirus 2 by RT PCR NEGATIVE NEGATIVE Final    Comment: (NOTE) SARS-CoV-2 target nucleic acids are NOT DETECTED.  The SARS-CoV-2 RNA is generally detectable in upper respiratory specimens during the  acute phase of infection. The lowest concentration of SARS-CoV-2 viral copies this assay can detect is 138  copies/mL. A negative result does not preclude SARS-Cov-2 infection and should not be used as the sole basis for treatment or other patient management decisions. A negative result may occur with  improper specimen collection/handling, submission of specimen other than nasopharyngeal swab, presence of viral mutation(s) within the areas targeted by this assay, and inadequate number of viral copies(<138 copies/mL). A negative result must be combined with clinical observations, patient history, and epidemiological information. The expected result is Negative.  Fact Sheet for Patients:  EntrepreneurPulse.com.au  Fact Sheet for Healthcare Providers:  IncredibleEmployment.be  This test is no t yet approved or cleared by the [place] FDA and  has been authorized for detection and/or diagnosis of SARS-CoV-2 by FDA under an Emergency Use Authorization (EUA). This EUA will remain  in effect (meaning this test can be used) for the duration of the COVID-19 declaration under Section 564(b)(1) of the Act, 21 U.S.C.section 360bbb-3(b)(1), unless the authorization is terminated  or revoked sooner.       Influenza A by PCR NEGATIVE NEGATIVE Final   Influenza B by PCR NEGATIVE NEGATIVE Final    Comment: (NOTE) The Xpert Xpress SARS-CoV-2/FLU/RSV plus assay is intended as an aid in the diagnosis of influenza from Nasopharyngeal swab specimens and should not be used as a sole basis for treatment. Nasal washings and aspirates are unacceptable for Xpert Xpress SARS-CoV-2/FLU/RSV testing.  Fact Sheet for Patients: EntrepreneurPulse.com.au  Fact Sheet for Healthcare Providers: IncredibleEmployment.be  This test is not yet approved or cleared by the [place] FDA and has been authorized for detection and/or diagnosis of SARS-CoV-2 by FDA under an Emergency Use Authorization (EUA). This EUA will remain in effect (meaning  this test can be used) for the duration of the COVID-19 declaration under Section 564(b)(1) of the Act, 21 U.S.C. section 360bbb-3(b)(1), unless the authorization is terminated or revoked.     Resp Syncytial Virus by PCR NEGATIVE NEGATIVE Final    Comment: (NOTE) Fact Sheet for Patients: EntrepreneurPulse.com.au  Fact Sheet for Healthcare Providers: IncredibleEmployment.be  This test is not yet approved or cleared by the [place] FDA and has been authorized for detection and/or diagnosis of SARS-CoV-2 by FDA under an Emergency Use Authorization (EUA). This EUA will remain in effect (meaning this test can be used) for the duration of the COVID-19 declaration under Section 564(b)(1) of the Act, 21 U.S.C. section 360bbb-3(b)(1), unless the authorization is terminated or revoked.  Performed at [facility], [address] [place], [place] [address]   Culture, Urine (Do not remove urinary catheter, catheter placed by urology or difficult to place)     Status: Abnormal (Preliminary result)   Collection Time: 05/19/22  9:30 PM   Specimen: Urine, Catheterized  Result Value Ref Range Status   Specimen Description   Final    URINE, CATHETERIZED Performed at [facility], [address], [place], [place] [address]    Special Requests   Final    NONE Performed at [facility], [address] [place], [place] [address]    Culture (A)  Final    10,000 COLONIES/mL ESCHERICHIA COLI 5,000 COLONIES/mL KLEBSIELLA OXYTOCA SUSCEPTIBILITIES TO FOLLOW Performed at [facility], [place] [address] [place],  [address]    Report Status PENDING  Incomplete  Blood Culture (routine x 2)     Status: None (Preliminary result)   Collection Time: 05/19/22 10:04 PM  Specimen: BLOOD LEFT FOREARM  Result Value Ref Range Status   Specimen Description BLOOD LEFT FOREARM  Final   Special Requests   Final    BOTTLES DRAWN  AEROBIC AND ANAEROBIC Blood Culture adequate volume   Culture   Final    NO GROWTH 2 DAYS Performed at [facility], [address], [place], [place] [address]    Report Status PENDING  Incomplete  Blood Culture (routine x 2)     Status: None (Preliminary result)   Collection Time: 05/19/22 10:05 PM   Specimen: BLOOD RIGHT FOREARM  Result Value Ref Range Status   Specimen Description BLOOD RIGHT FOREARM  Final   Special Requests   Final    BOTTLES DRAWN AEROBIC AND ANAEROBIC Blood Culture adequate volume   Culture   Final    NO GROWTH 2 DAYS Performed at [facility], [address], [place], [place] [address]    Report Status PENDING  Incomplete     Time coordinating discharge: Over 30 minutes  SIGNED:   Shawna Clamp, MD  [facility] 05/21/2022, 2:33 PM Pager   If 7PM-7AM, please contact night-coverage

## 2022-05-21 NOTE — Discharge Instructions (Signed)
Advised to continue current medications as prescribed.

## 2022-05-21 NOTE — TOC Progression Note (Signed)
Transition of Care [facility]) - Progression Note    Patient Details  Name: Scott Buckley MRN: [id number] Date of Birth: 10-25-1940  [facility]) CM/SW [place], RN Phone Number: 05/21/2022, 10:37 AM  Clinical Narrative:     The patient has had [facility] in the past and they have accepted him again for [facility] services, Has DME At home Family to transport  Expected Discharge Plan: [place] Barriers to Discharge: No Barriers Identified  Expected Discharge Plan and Services   Discharge Planning Services: CM Consult   Living arrangements for the past 2 months: Single Family Home Expected Discharge Date: 05/21/22               DME Arranged: N/A         HH Arranged: PT, OT HH Agency: [facility] Date HH Agency Contacted: 05/21/22 Time [place]: 1036 Representative spoke with at [place]: [place]   Social Determinants of Health ([place]) Interventions SDOH Screenings   Food Insecurity: No Food Insecurity (05/20/2022)  Housing: Low Risk  (05/20/2022)  Transportation Needs: No Transportation Needs (05/20/2022)  Utilities: Not At Risk (05/20/2022)  Depression (PHQ2-9): Low Risk  (05/10/2019)  Tobacco Use: High Risk (05/20/2022)    Readmission Risk Interventions     No data to display

## 2022-05-22 LAB — URINE CULTURE: Culture: 10000 — AB

## 2022-05-24 LAB — CULTURE, BLOOD (ROUTINE X 2)
Culture: NO GROWTH
Culture: NO GROWTH
Special Requests: ADEQUATE
Special Requests: ADEQUATE

## 2022-07-04 ENCOUNTER — Encounter: Payer: Self-pay | Admitting: Urology

## 2022-07-04 ENCOUNTER — Ambulatory Visit: Payer: Medicare HMO | Admitting: Urology

## 2022-07-04 VITALS — BP 159/79 | HR 80 | Ht 75.0 in | Wt 224.0 lb

## 2022-07-04 DIAGNOSIS — N401 Enlarged prostate with lower urinary tract symptoms: Secondary | ICD-10-CM

## 2022-07-04 DIAGNOSIS — Z8546 Personal history of malignant neoplasm of prostate: Secondary | ICD-10-CM

## 2022-07-04 DIAGNOSIS — Z87898 Personal history of other specified conditions: Secondary | ICD-10-CM

## 2022-07-04 DIAGNOSIS — R339 Retention of urine, unspecified: Secondary | ICD-10-CM

## 2022-07-04 DIAGNOSIS — C61 Malignant neoplasm of prostate: Secondary | ICD-10-CM

## 2022-07-04 LAB — BLADDER SCAN AMB NON-IMAGING

## 2022-07-04 MED ORDER — TAMSULOSIN HCL 0.4 MG PO CAPS: 0.4000 mg | ORAL_CAPSULE | Freq: Every day | ORAL | 3 refills | Status: DC

## 2022-07-04 MED ORDER — FINASTERIDE 5 MG PO TABS: 5.0000 mg | ORAL_TABLET | Freq: Every day | ORAL | 3 refills | Status: DC

## 2022-07-04 NOTE — Progress Notes (Signed)
   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  CT showed distended bladder with bilateral hydronephrosis.  Cystoscopy showed no evidence of urethral stricture and a Foley catheter was placed.  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  He failed a voiding trial despite Flomax, and ultimately opted for UroLift.  He was unwilling to accept risk of incontinence with HOLEP with his history of radiation.  He underwent an uncomplicated UroLift on [id number] with a wide open channel at the conclusion.  He passed a voiding trial 3 days later with a PVR of 146ml.  At our follow-up visit in December 2023 he had a elevated PVR 475ml but symptoms had improved significantly from prior to UroLift and he was only having mild leakage.  He refused catheterization/CIC, but was amenable to resuming Flomax.  He was hospitalized in February 2024 for a syncopal episode associated with bowel movement, urinalysis showed 6-10 WBC but was otherwise benign, culture ultimately grew very small <10k colonies of E. coli and Klebsiella, and he was given fluids and antibiotics during his hospitalization.  Is unclear if he was discharged on antibiotics.  He denied any urinary symptoms at that visit.  I reviewed those notes extensively, as well as reviewed the CT abdomen and pelvis that was performed showing no urologic abnormalities, specifically a nondistended bladder and no hydronephrosis.  Renal function had also normalized back to his baseline with creatinine 1.2, EGFR 60 improved from 1.9(eGFR 35) when he was having overflow incontinence and upstream hydronephrosis prior to  undergoing UroLift.  Today he denies any urinary complaints.  He is voiding with a good stream and denies a sensation of incomplete emptying he has rare nocturia overnight.  Denies any significant incontinence.  PVR today elevated at 560ml.  I think in the setting of his normal CT with no hydronephrosis, absence of urinary symptoms, normal renal function, we can continue to monitor his elevated PVRs, especially in the setting of his age, comorbidities, and refusal of a Foley catheter, CIC, or suprapubic tube.  I recommended adding finasteride to try to maximize his emptying.  -Continue Flomax, add finasteride -Return precautions discussed at length -RTC 6 months PVR, okay to monitor elevated PVRs if asymptomatic without recurrent infections, hydronephrosis/worsening renal function, or overflow incontinence   [place], [place] [address] [name], [place] [address] [phone]

## 2022-08-30 ENCOUNTER — Other Ambulatory Visit: Payer: Self-pay

## 2022-08-30 DIAGNOSIS — N1330 Unspecified hydronephrosis: Secondary | ICD-10-CM

## 2022-09-11 ENCOUNTER — Emergency Department
Admission: EM | Admit: 2022-09-11 | Discharge: 2022-09-12 | Disposition: A | Discharge status: Home / Self Care | Payer: Medicare HMO | Attending: Emergency Medicine | Admitting: Emergency Medicine

## 2022-09-11 ENCOUNTER — Other Ambulatory Visit: Payer: Self-pay

## 2022-09-11 DIAGNOSIS — R112 Nausea with vomiting, unspecified: Secondary | ICD-10-CM | POA: Insufficient documentation

## 2022-09-11 DIAGNOSIS — Z8546 Personal history of malignant neoplasm of prostate: Secondary | ICD-10-CM | POA: Diagnosis not present

## 2022-09-11 DIAGNOSIS — R197 Diarrhea, unspecified: Secondary | ICD-10-CM | POA: Insufficient documentation

## 2022-09-11 DIAGNOSIS — R1084 Generalized abdominal pain: Secondary | ICD-10-CM | POA: Diagnosis not present

## 2022-09-11 LAB — CBC WITH DIFFERENTIAL/PLATELET
Abs Immature Granulocytes: 0.14 10*3/uL — ABNORMAL HIGH (ref 0.00–0.07)
Basophils Absolute: 0.1 10*3/uL (ref 0.0–0.1)
Basophils Relative: 0 %
Eosinophils Absolute: 0.1 10*3/uL (ref 0.0–0.5)
Eosinophils Relative: 0 %
HCT: 38.3 % — ABNORMAL LOW (ref 39.0–52.0)
Hemoglobin: 12 g/dL — ABNORMAL LOW (ref 13.0–17.0)
Immature Granulocytes: 1 %
Lymphocytes Relative: 7 %
Lymphs Abs: 1.4 10*3/uL (ref 0.7–4.0)
MCH: 29.1 pg (ref 26.0–34.0)
MCHC: 31.3 g/dL (ref 30.0–36.0)
MCV: 92.7 fL (ref 80.0–100.0)
Monocytes Absolute: 1.1 10*3/uL — ABNORMAL HIGH (ref 0.1–1.0)
Monocytes Relative: 5 %
Neutro Abs: 17.2 10*3/uL — ABNORMAL HIGH (ref 1.7–7.7)
Neutrophils Relative %: 87 %
Platelets: 250 10*3/uL (ref 150–400)
RBC: 4.13 MIL/uL — ABNORMAL LOW (ref 4.22–5.81)
RDW: 15.1 % (ref 11.5–15.5)
WBC: 19.8 10*3/uL — ABNORMAL HIGH (ref 4.0–10.5)
nRBC: 0 % (ref 0.0–0.2)

## 2022-09-11 LAB — LIPASE, BLOOD: Lipase: 60 U/L — ABNORMAL HIGH (ref 11–51)

## 2022-09-11 LAB — COMPREHENSIVE METABOLIC PANEL
ALT: 10 U/L (ref 0–44)
AST: 18 U/L (ref 15–41)
Albumin: 3.6 g/dL (ref 3.5–5.0)
Alkaline Phosphatase: 47 U/L (ref 38–126)
Anion gap: 11 (ref 5–15)
BUN: 26 mg/dL — ABNORMAL HIGH (ref 8–23)
CO2: 25 mmol/L (ref 22–32)
Calcium: 8.3 mg/dL — ABNORMAL LOW (ref 8.9–10.3)
Chloride: 104 mmol/L (ref 98–111)
Creatinine, Ser: 1.8 mg/dL — ABNORMAL HIGH (ref 0.61–1.24)
GFR, Estimated: 37 mL/min — ABNORMAL LOW (ref >60)
Glucose, Bld: 218 mg/dL — ABNORMAL HIGH (ref 70–99)
Potassium: 3.9 mmol/L (ref 3.5–5.1)
Sodium: 140 mmol/L (ref 135–145)
Total Bilirubin: 0.7 mg/dL (ref 0.3–1.2)
Total Protein: 6.8 g/dL (ref 6.5–8.1)

## 2022-09-11 LAB — MAGNESIUM: Magnesium: 1.5 mg/dL — ABNORMAL LOW (ref 1.7–2.4)

## 2022-09-11 LAB — TROPONIN I (HIGH SENSITIVITY): Troponin I (High Sensitivity): 7 ng/L (ref <18)

## 2022-09-11 MED ORDER — LACTATED RINGERS IV BOLUS
1000.0000 mL | Freq: Once | INTRAVENOUS | Status: AC
  Administered 2022-09-11: 1000 mL via INTRAVENOUS

## 2022-09-11 MED ORDER — ONDANSETRON HCL 4 MG/2ML IJ SOLN
4.0000 mg | Freq: Once | INTRAMUSCULAR | Status: AC
  Administered 2022-09-11: 4 mg via INTRAVENOUS
  Filled 2022-09-11: qty 2

## 2022-09-11 NOTE — ED Triage Notes (Signed)
Brought in via medic for nausea resolved/vomit once/diarrhea 4x since 1900, patient reports could have been something he ate. Patient denies pain, chest pain, sob, fever

## 2022-09-11 NOTE — ED Provider Notes (Signed)
[facility] Provider Note    Event Date/Time   First MD Initiated Contact with Patient 09/11/22 2254     (approximate)   History   Nausea (Brought in via medic for nausea resolved/vomit once/diarrhea 4x since 1900, patient reports could have been something he ate. Patient denies pain, chest pain, sob, fever), Emesis, and Diarrhea   HPI  Scott Buckley is a 82 y.o. male who presents to the ED for evaluation of Nausea (Brought in via medic for nausea resolved/vomit once/diarrhea 4x since 1900, patient reports could have been something he ate. Patient denies pain, chest pain, sob, fever), Emesis, and Diarrhea   Review of [facility] DC summary where patient was admitted 5/4 - 5/9 for syncope, hypotension, gastroenteritis and possible sepsis. Strep viridans on blood culture 1/2 and discharged with PICC line and ceftriaxone x 14 days.  History of prostate cancer remotely and noted to have right-sided hydronephrosis on imaging.  Transiently had a Foley catheter and they recommended he discharge with monitoring, but patient refused and went home without a Foley.  Patient presents to the ED alongside his daughter from home.  Today is his birthday he was celebrating with family.  He reports he has been doing well since discharge a month ago without concerns or incidents or illnesses.  Does report he often has diarrhea and "upset stomach" on a chronic timeframe.  About 1.5 hours after dinner this evening he reports developing lower abdominal discomfort, had run to the bathroom and had about 4-6 episodes of loose stool without blood.  Became nauseous and had couple episodes of emesis, similarly nonbloody nonbilious.  Reports continued intermittent lower abdominal discomfort and loose stool.  Dizziness and weakness on the toilet but no falls or syncope.  Reports this feels similar as when he was admitted at [facility] a month ago.  No recent fevers.  Admits to a "weak stream" regarding his  voiding but no dysuria or acute changes  Physical Exam   Triage Vital Signs: ED Triage Vitals [09/11/22 2255]  Enc Vitals Group     BP 92/71     Pulse Rate 90     Resp 15     Temp 97.7 F (36.5 C)     Temp Source Oral     SpO2 100 %     Weight      Height      Head Circumference      Peak Flow      Pain Score      Pain Loc      Pain Edu?      Excl. in GC?     Most recent vital signs: Vitals:   09/12/22 0300 09/12/22 0330  BP: 91/73 102/82  Pulse: 98 97  Resp: 16 14  Temp:    SpO2: 98% 97%    General: Awake, no distress.  Pleasant and conversational, making jokes and looks systemically well CV:  Good peripheral perfusion.  Resp:  Normal effort.  Abd:  No distention.  Diffuse and mild tenderness without peritoneal features MSK:  No deformity noted.  Neuro:  No focal deficits appreciated. Other:     ED Results / Procedures / Treatments   Labs (all labs ordered are listed, but only abnormal results are displayed) Labs Reviewed  LIPASE, BLOOD - Abnormal; Notable for the following components:      Result Value   Lipase 60 (*)    All other components within normal limits  MAGNESIUM - Abnormal; Notable  for the following components:   Magnesium 1.5 (*)    All other components within normal limits  URINALYSIS, ROUTINE W REFLEX MICROSCOPIC - Abnormal; Notable for the following components:   Color, Urine YELLOW (*)    APPearance HAZY (*)    Specific Gravity, Urine 1.038 (*)    Protein, ur 30 (*)    All other components within normal limits  COMPREHENSIVE METABOLIC PANEL - Abnormal; Notable for the following components:   Glucose, Bld 218 (*)    BUN 26 (*)    Creatinine, Ser 1.80 (*)    Calcium 8.3 (*)    GFR, Estimated 37 (*)    All other components within normal limits  CBC WITH DIFFERENTIAL/PLATELET - Abnormal; Notable for the following components:   WBC 19.8 (*)    RBC 4.13 (*)    Hemoglobin 12.0 (*)    HCT 38.3 (*)    Neutro Abs 17.2 (*)    Monocytes  Absolute 1.1 (*)    Abs Immature Granulocytes 0.14 (*)    All other components within normal limits  TROPONIN I (HIGH SENSITIVITY)  TROPONIN I (HIGH SENSITIVITY)    EKG Sinus rhythm with a rate of 91 bpm.  Normal axis, right bundle.  No STEMI.  RADIOLOGY CT abdomen/pelvis interpreted by me without evidence of acute intra-abdominal pathology.  Official radiology report(s): CT Angio Abd/Pel W and/or Wo Contrast  Result Date: 09/12/2022 CLINICAL DATA:  Nausea and vomiting EXAM: CTA ABDOMEN AND PELVIS WITHOUT AND WITH CONTRAST TECHNIQUE: Multidetector CT imaging of the abdomen and pelvis was performed using the standard protocol during bolus administration of intravenous contrast. Multiplanar reconstructed images and MIPs were obtained and reviewed to evaluate the vascular anatomy. RADIATION DOSE REDUCTION: This exam was performed according to the departmental dose-optimization program which includes automated exposure control, adjustment of the mA and/or kV according to patient size and/or use of iterative reconstruction technique. CONTRAST:  75mL OMNIPAQUE IOHEXOL 350 MG/ML SOLN COMPARISON:  By report from 08/10/2022 FINDINGS: VASCULAR Aorta: Atherosclerotic calcifications are noted. Aortic stent graft is noted in satisfactory position. This appears patent. No findings to suggest endoleak are noted. Celiac: Patent without evidence of aneurysm, dissection, vasculitis or significant stenosis. SMA: Patent without evidence of aneurysm, dissection, vasculitis or significant stenosis. Renals: Mild atherosclerotic calcifications are noted in both renal arteries. No focal significant stenosis is noted. IMA: Excluded by the stent graft and recanalized via collaterals from the superior mesenteric artery. Inflow: Iliacs are within normal limits. 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 Veins: No specific venous abnormality is noted. Review of the MIP images confirms the above  findings. NON-VASCULAR Lower chest: No acute abnormality. Hepatobiliary: No focal liver abnormality is seen. No gallstones, gallbladder wall thickening, or biliary dilatation. Pancreas: Unremarkable. No pancreatic ductal dilatation or surrounding inflammatory changes. Spleen: Normal in size without focal abnormality. Adrenals/Urinary Tract: Adrenal glands are within normal limits. Kidneys are well visualized bilaterally. Cystic changes are noted in both kidneys which appears simple and stable from the prior exam. No follow-up is recommended. 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 The bladder is well distended. Stomach/Bowel: No obstructive or inflammatory changes of the colon are seen. The appendix is not well visualized. No inflammatory changes to suggest appendicitis are seen. The small bowel and stomach are within normal limits. Lymphatic: No lymphadenopathy is seen. Reproductive: Prostate is stable in appearance. Other: No abdominal wall hernia or abnormality. No abdominopelvic ascites. 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 No acute  bony abnormality is noted. IMPRESSION: VASCULAR Patent aortic stent graft without evidence of endoleak. No acute arterial abnormality is noted. NON-VASCULAR Chronic changes without acute abnormality. Electronically Signed   By: Alcide Clever M.D.   On: 09/12/2022 00:46    PROCEDURES and INTERVENTIONS:  .1-3 Lead EKG Interpretation  Performed by: Delton Prairie, MD Authorized by: Delton Prairie, MD     Interpretation: normal     ECG rate:  90   ECG rate assessment: normal     Rhythm: sinus rhythm     Ectopy: none     Conduction: normal     Medications  lactated ringers bolus 1,000 mL (0 mLs Intravenous Stopped 09/12/22 0015)  ondansetron (ZOFRAN) injection 4 mg (4 mg Intravenous Given 09/11/22 2330)  iohexol (OMNIPAQUE) 350 MG/ML injection 75 mL (75 mLs Intravenous Contrast Given 09/12/22 0016)     IMPRESSION / MDM / ASSESSMENT AND PLAN / ED COURSE  I  reviewed the triage vital signs and the nursing notes.  Differential diagnosis includes, but is not limited to, intra-abdominal aneurysmal rupture, gastroenteritis, sepsis, UTI  {Patient presents with symptoms of an acute illness or injury that is potentially life-threatening.  Patient presents to the ED for evaluation N/V/D, possibly foodborne or viral in etiology but ultimately suitable for trial of outpatient management.  Looks fairly well.  Mild abdominal tenderness but no peritoneal features or other concerning exam findings.  Blood work with leukocytosis, but no other clear SIRS or sepsis criteria.  Possibly related to volume loss and hemoconcentration considering all cell lines are increased relative to comparison values.  CKD around baseline, somewhat worse I suspect again due to fluid loss and dehydration.  He received a liter of LR and oral fluids.  Urine without infectious features considering his lack of urinary symptoms.  CT obtained without acute features and he does not have any recurrent episodes here in the ED.  He is tolerating p.o. and suitable for outpatient management.  No clear indications to initiate antibiotics.  Suspect most likely foodborne related illness.  Clinical Course as of 09/12/22 0429  Thu Sep 12, 2022  0131 reassesed [DS]  1610 Reassessed.  Patient reports feeling well.  No further episodes of diarrhea.  He is asking about going home, think this is reasonable [DS]    Clinical Course User Index [DS] Delton Prairie, MD     FINAL CLINICAL IMPRESSION(S) / ED DIAGNOSES   Final diagnoses:  Nausea vomiting and diarrhea     Rx / DC Orders   ED Discharge Orders          Ordered    ondansetron (ZOFRAN-ODT) 4 MG disintegrating tablet  Every 8 hours PRN        09/12/22 0426             Note:  This document was prepared using Dragon voice recognition software and may include unintentional dictation errors.   Delton Prairie, MD 09/12/22 [phone]

## 2022-09-12 ENCOUNTER — Ambulatory Visit: Payer: Medicare HMO

## 2022-09-12 ENCOUNTER — Emergency Department: Payer: Medicare HMO

## 2022-09-12 LAB — URINALYSIS, ROUTINE W REFLEX MICROSCOPIC
Bacteria, UA: NONE SEEN
Bilirubin Urine: NEGATIVE
Glucose, UA: NEGATIVE mg/dL
Hgb urine dipstick: NEGATIVE
Ketones, ur: NEGATIVE mg/dL
Leukocytes,Ua: NEGATIVE
Nitrite: NEGATIVE
Protein, ur: 30 mg/dL — AB
Specific Gravity, Urine: 1.038 — ABNORMAL HIGH (ref 1.005–1.030)
pH: 5 (ref 5.0–8.0)

## 2022-09-12 LAB — TROPONIN I (HIGH SENSITIVITY): Troponin I (High Sensitivity): 7 ng/L (ref <18)

## 2022-09-12 MED ORDER — IOHEXOL 350 MG/ML SOLN
75.0000 mL | Freq: Once | INTRAVENOUS | Status: AC | PRN
  Administered 2022-09-12: 75 mL via INTRAVENOUS

## 2022-09-12 MED ORDER — ONDANSETRON 4 MG PO TBDP: 4.0000 mg | ORAL_TABLET | Freq: Three times a day (TID) | ORAL | 0 refills | Status: DC | PRN

## 2022-09-12 NOTE — Discharge Instructions (Addendum)
Continue his typical medications.  I sent a prescription for Zofran nausea medicine to use as needed for any further episodes.  Return to the ED with any worsening symptoms

## 2022-09-12 NOTE — ED Notes (Signed)
PT soiled brief with diarrhea. NT Hydrographic surveyor) performed peri care. PT cleaned and dried and brief applied.

## 2022-09-23 ENCOUNTER — Ambulatory Visit
Admission: RE | Admit: 2022-09-23 | Discharge: 2022-09-23 | Disposition: A | Discharge status: Home / Self Care | Payer: Medicare HMO | Source: Ambulatory Visit | Attending: Family Medicine | Admitting: Family Medicine

## 2022-09-23 DIAGNOSIS — N1330 Unspecified hydronephrosis: Secondary | ICD-10-CM | POA: Diagnosis present

## 2022-09-26 ENCOUNTER — Other Ambulatory Visit: Payer: Self-pay | Admitting: Student

## 2022-09-26 DIAGNOSIS — G20C Parkinsonism, unspecified: Secondary | ICD-10-CM

## 2022-09-27 ENCOUNTER — Ambulatory Visit: Payer: Medicare HMO | Admitting: Physician Assistant

## 2022-09-27 VITALS — BP 135/77 | HR 92 | Ht 74.0 in | Wt 220.5 lb

## 2022-09-27 DIAGNOSIS — R339 Retention of urine, unspecified: Secondary | ICD-10-CM

## 2022-09-27 LAB — URINALYSIS, COMPLETE
Bilirubin, UA: NEGATIVE
Glucose, UA: NEGATIVE
Ketones, UA: NEGATIVE
Leukocytes,UA: NEGATIVE
Nitrite, UA: NEGATIVE
Protein,UA: NEGATIVE
RBC, UA: NEGATIVE
Specific Gravity, UA: 1.02 (ref 1.005–1.030)
Urobilinogen, Ur: 0.2 mg/dL (ref 0.2–1.0)
pH, UA: 5.5 (ref 5.0–7.5)

## 2022-09-27 LAB — MICROSCOPIC EXAMINATION

## 2022-09-27 LAB — BLADDER SCAN AMB NON-IMAGING: Scan Result: 398

## 2022-09-27 NOTE — Progress Notes (Signed)
09/27/2022 1:20 PM   [name] 30-Jan-1941 [id number]  CC: Chief Complaint  Patient presents with   Urinary Incontinence   HPI: Scott Buckley is a 82 y.o. 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  He is accompanied today by his wife, Scott Buckley, who contributes to HPI.  Today he reports occasional bouts of diarrhea about every 3 weeks but no acute urinary symptoms. He remains opposed to CIC, Foley catheter, or suprapubic catheter.  Renal ultrasound from 09/23/2022 noted moderate right hydronephrosis and PVR .  Creatinine on 09/11/2022 was 1.80, elevated over baseline 1.2.  In-office UA and microscopy today pan negative. PVR , previously 550 mL.  PMH: Past Medical History:  Diagnosis Date   Aneurysm (HCC)    Cancer (HCC)    Diabetes mellitus without complication (HCC)    Hypertension    Renal disorder     Surgical History: Past Surgical History:  Procedure Laterality Date   CYSTOSCOPY WITH INSERTION OF UROLIFT N/A 02/22/2022   Procedure: CYSTOSCOPY WITH INSERTION OF UROLIFT;  Surgeon: Sondra Come, MD;  Location: [facility] ORS;  Service: Urology;  Laterality: N/A;   HERNIA REPAIR     JOINT REPLACEMENT Bilateral    hip replaced and Left knee   TOTAL HIP REVISION      Home Medications:  Allergies as of 09/27/2022       Reactions   Metformin Diarrhea   Sulfa Antibiotics Nausea And Vomiting, Other (See Comments)   Patient states not allergic   Varicella Virus Vaccine Live Other (See Comments)   Cramps and weakness   Zoster Vac Recomb Adjuvanted    Other reaction(s): Not available   Zoster Vaccine Recombinant, Adjuvanted    Other Reaction(s): Unknown Other reaction(s): Not available        Medication List        Accurate as of September 27, 2022  1:20 PM. If you have any questions, ask your nurse or doctor.          acetaminophen 650 MG CR tablet Commonly known as: TYLENOL Take 650 mg by mouth every 8 (eight) hours as needed.   Creon 36000 UNITS Cpep capsule Generic drug: lipase/protease/amylase Take 36,000 Units by mouth 3 (three) times daily.   cyanocobalamin 1000 MCG tablet Commonly known as: VITAMIN B12 Take by mouth.   Dulaglutide 3 MG/0.5ML Sopn Inject into the skin once a week.   finasteride 5 MG tablet Commonly known as: PROSCAR Take 1 tablet (5 mg total) by mouth daily.   furosemide 20 MG tablet Commonly known as: LASIX Take 20 mg by mouth daily.   glimepiride 4 MG tablet Commonly known as: AMARYL Take 4 mg by mouth 2 (two) times daily.   hydrochlorothiazide 25 MG tablet Commonly known as: HYDRODIURIL Take 25 mg by mouth daily.   lamoTRIgine 25 MG tablet Commonly known as: LAMICTAL Take 25 mg by mouth.   lisinopril 20 MG tablet Commonly known as: ZESTRIL Take 20 mg by mouth daily.   multivitamin tablet Take 1 tablet by mouth daily.   ondansetron 4 MG disintegrating tablet Commonly known as: ZOFRAN-ODT Take 4 mg by mouth every 8 (eight) hours as needed.   ondansetron 4 MG disintegrating tablet Commonly known as: ZOFRAN-ODT Take 1 tablet (4 mg total) by mouth every 8 (eight) hours as needed.   OneTouch  Delica Plus Lancet30G Misc Checks 1-2 times a week   rosuvastatin 20 MG tablet Commonly known as: CRESTOR Take 20 mg by mouth daily.   tamsulosin 0.4 MG Caps capsule Commonly known as: FLOMAX Take 1 capsule (0.4 mg total) by mouth daily after supper.        Allergies:  Allergies  Allergen Reactions   Metformin Diarrhea   Sulfa Antibiotics Nausea And Vomiting and Other (See Comments)    Patient states not allergic   Varicella Virus Vaccine Live Other (See Comments)    Cramps and weakness   Zoster Vac Recomb Adjuvanted     Other reaction(s): Not available   Zoster  Vaccine Recombinant, Adjuvanted     Other Reaction(s): Unknown  Other reaction(s): Not available    Family History: Family History  Problem Relation Age of Onset   Hyperlipidemia Mother    Hypertension Mother    Diabetes Mother    Hypertension Father    Heart disease Father    Diabetes Brother     Social History:   reports that he has been smoking cigars. He has been exposed to tobacco smoke. He has never used smokeless tobacco. He reports that he does not use drugs. No history on file for alcohol use.  Physical Exam: BP 135/77   Pulse 92   Ht 6\' 2"  (1.88 m)   Wt 220 lb 8 oz (100 kg)   BMI 28.31 kg/m   Constitutional:  Alert and oriented, no acute distress, nontoxic appearing HEENT: [place], AT Cardiovascular: No clubbing, cyanosis, or edema Respiratory: Normal respiratory effort, no increased work of breathing Skin: No rashes, bruises or suspicious lesions Neurologic: Grossly intact, no focal deficits, moving all 4 extremities Psychiatric: Normal mood and affect  Laboratory Data: Results for orders placed or performed in visit on 09/27/22  Microscopic Examination   Urine  Result Value Ref Range   WBC, UA 0-5 0 - 5 /hpf   RBC, Urine 0-2 0 - 2 /hpf   Epithelial Cells (non renal) 0-10 0 - 10 /hpf   Bacteria, UA Few None seen/Few  Urinalysis, Complete  Result Value Ref Range   Specific Gravity, UA 1.020 1.005 - 1.030   pH, UA 5.5 5.0 - 7.5   Color, UA Yellow Yellow   Appearance Ur Clear Clear   Leukocytes,UA Negative Negative   Protein,UA Negative Negative/Trace   Glucose, UA Negative Negative   Ketones, UA Negative Negative   RBC, UA Negative Negative   Bilirubin, UA Negative Negative   Urobilinogen, Ur 0.2 0.2 - 1.0 mg/dL   Nitrite, UA Negative Negative   Microscopic Examination See below:   Bladder Scan (Post Void Residual) in office  Result Value Ref Range   Scan Result 398 ml    Pertinent Imaging: Results for orders placed during the hospital encounter of  09/23/22  US RENAL  Narrative CLINICAL DATA:  History of hydronephrosis on the right  EXAM: RENAL / URINARY TRACT ULTRASOUND COMPLETE  COMPARISON:  CT scan of the abdomen and pelvis September 12, 2022  FINDINGS: Right Kidney:  Renal measurements: 12.6 x 6.5 x 5.3 cm = volume: 225 mL. Increased cortical echogenicity. Contains a 13 mm cyst. No follow-up imaging recommended for the cyst. Moderate hydronephrosis.  Left Kidney:  Renal measurements: 11.6 x 6.9 x 6.0 cm = volume: 250.4 mL. Increased cortical echogenicity. Contains a 2 cm cyst. No follow-up imaging recommended for the cyst.  Bladder:  Large 407 cc postvoid residual. The bladder is otherwise unremarkable.  Other:  None.  IMPRESSION: 1. Moderate hydronephrosis on the right. 2. Increased cortical echogenicity in both kidneys consistent with medical renal disease. 3. Large postvoid residual of 407 cc.   Electronically Signed By: Gerome Sam III M.D. On: 09/23/2022 16:33   I personally reviewed the images referenced above and note new right hydronephrosis.  Assessment & Plan:   1. Urinary retention Chronic urinary retention managed conservatively, however now with new right hydronephrosis and elevated creatinine over baseline.  UA is bland, low suspicion for UTI.  He remains rather opposed to urinary catheterization versus CIC.  I offered him repeat serum creatinine today to confirm elevated creatinine.  If his creatinine has returned to baseline, okay to continue with conservative management for now as he remains asymptomatic.  Otherwise, if his creatinine remains elevated I will strongly recommend pursuing CIC versus chronic urethral or suprapubic catheter.  He expressed understanding. - Bladder Scan (Post Void Residual) in office - Urinalysis, Complete - Creatinine, serum   Return for Will call with results.  Carman Ching, PA-C  [facility]  [address] [place], [place] [address] [phone]

## 2022-09-28 LAB — CREATININE, SERUM
Creatinine, Ser: 1.31 mg/dL — ABNORMAL HIGH (ref 0.76–1.27)
eGFR: 54 mL/min/{1.73_m2} — ABNORMAL LOW (ref >59)

## 2022-10-08 ENCOUNTER — Telehealth: Payer: Self-pay

## 2022-10-08 NOTE — Telephone Encounter (Signed)
Patient and wife calling about lab results. Wife asked if someone can call her about blood work and whether or not patient will need to have a catheter. Advised someone from [facility] team would reach out.

## 2022-10-08 NOTE — Telephone Encounter (Signed)
I sincerely apologize for the delay in reporting his results.  I recall reviewing them, but I think his chart timed out and the results note was erased.  Good news, his kidney function was back to baseline the day I saw him.  We can continue without a catheter for now.  He should keep follow-up with Dr. Richardo Hanks later this year as scheduled.

## 2022-10-09 NOTE — Telephone Encounter (Signed)
Pt informed, verbalized understanding.

## 2022-10-15 ENCOUNTER — Other Ambulatory Visit (INDEPENDENT_AMBULATORY_CARE_PROVIDER_SITE_OTHER): Payer: Self-pay | Admitting: Vascular Surgery

## 2022-10-15 DIAGNOSIS — I723 Aneurysm of iliac artery: Secondary | ICD-10-CM

## 2022-10-24 ENCOUNTER — Encounter: Payer: Self-pay | Admitting: Student

## 2022-10-25 ENCOUNTER — Ambulatory Visit (INDEPENDENT_AMBULATORY_CARE_PROVIDER_SITE_OTHER): Payer: Medicare HMO

## 2022-10-25 ENCOUNTER — Ambulatory Visit (INDEPENDENT_AMBULATORY_CARE_PROVIDER_SITE_OTHER): Payer: Medicare HMO | Admitting: Vascular Surgery

## 2022-10-25 ENCOUNTER — Encounter (INDEPENDENT_AMBULATORY_CARE_PROVIDER_SITE_OTHER): Payer: Self-pay | Admitting: Vascular Surgery

## 2022-10-25 VITALS — BP 128/75 | HR 73 | Resp 16 | Wt 214.0 lb

## 2022-10-25 DIAGNOSIS — I723 Aneurysm of iliac artery: Secondary | ICD-10-CM

## 2022-10-25 DIAGNOSIS — I10 Essential (primary) hypertension: Secondary | ICD-10-CM | POA: Diagnosis not present

## 2022-10-25 DIAGNOSIS — E119 Type 2 diabetes mellitus without complications: Secondary | ICD-10-CM

## 2022-10-25 DIAGNOSIS — I671 Cerebral aneurysm, nonruptured: Secondary | ICD-10-CM

## 2022-10-26 ENCOUNTER — Ambulatory Visit
Admission: RE | Admit: 2022-10-26 | Discharge: 2022-10-26 | Disposition: A | Discharge status: Home / Self Care | Payer: Medicare HMO | Source: Ambulatory Visit | Attending: Student | Admitting: Student

## 2022-10-26 DIAGNOSIS — G20C Parkinsonism, unspecified: Secondary | ICD-10-CM

## 2022-10-28 NOTE — Progress Notes (Signed)
MRN : [id number]  Scott Buckley is a 82 y.o. (1940-05-30) male who presents with chief complaint of  Chief Complaint  Patient presents with   Follow-up    Ultrasound follow up  .  History of Present Illness: Patient returns today in follow up of his aortic and iliac artery aneurysms.  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  He is doing well.  His biggest issues currently are of severe arthritic issues and some urologic issues.  No aneurysm related symptoms. Specifically, the patient denies new back or abdominal pain, or signs of peripheral embolization.  His duplex today shows a patent stent graft without endoleak and stable diameters of his aorta and iliac arteries.  Current Outpatient Medications  Medication Sig Dispense Refill   acetaminophen (TYLENOL) 650 MG CR tablet Take 650 mg by mouth every 8 (eight) hours as needed.     CREON 36000-114000 units CPEP capsule Take 36,000 Units by mouth 3 (three) times daily.     cyanocobalamin (VITAMIN B12) 1000 MCG tablet Take by mouth.     Dulaglutide 3 MG/0.5ML SOPN Inject into the skin once a week.     finasteride (PROSCAR) 5 MG tablet Take 1 tablet (5 mg total) by mouth daily. 90 tablet 3   furosemide (LASIX) 20 MG tablet Take 20 mg by mouth daily.     hydrochlorothiazide (HYDRODIURIL) 25 MG tablet Take 25 mg by mouth daily.     Lancets (ONETOUCH DELICA PLUS LANCET30G) MISC Checks 1-2 times a week     lisinopril (PRINIVIL,ZESTRIL) 20 MG tablet Take 20 mg by mouth daily.     Multiple Vitamin (MULTIVITAMIN) tablet Take 1 tablet by mouth daily.     ondansetron (ZOFRAN-ODT) 4 MG disintegrating tablet Take 4 mg by mouth every 8 (eight) hours as needed.     ondansetron (ZOFRAN-ODT) 4 MG disintegrating tablet Take 1 tablet (4 mg total) by mouth every 8 (eight) hours as needed. 20 tablet 0   rosuvastatin (CRESTOR) 20 MG tablet Take 20 mg by mouth daily.     tamsulosin (FLOMAX) 0.4 MG  CAPS capsule Take 1 capsule (0.4 mg total) by mouth daily after supper. 90 capsule 3   glimepiride (AMARYL) 4 MG tablet Take 4 mg by mouth 2 (two) times daily. (Patient not taking: Reported on 10/25/2022)     lamoTRIgine (LAMICTAL) 25 MG tablet Take 25 mg by mouth.     No current facility-administered medications for this visit.    Past Medical History:  Diagnosis Date   Aneurysm (HCC)    Cancer (HCC)    Diabetes mellitus without complication (HCC)    Hypertension    Renal disorder     Past Surgical History:  Procedure Laterality Date   CYSTOSCOPY WITH INSERTION OF UROLIFT N/A 02/22/2022   Procedure: CYSTOSCOPY WITH INSERTION OF UROLIFT;  Surgeon: Sondra Come, MD;  Location: [facility] ORS;  Service: Urology;  Laterality: N/A;   HERNIA REPAIR     JOINT REPLACEMENT Bilateral    hip replaced and Left knee   TOTAL HIP REVISION       Social History   Tobacco Use   Smoking status: Every Day    Types: Cigars    Passive exposure: Current (chews cigars, but it has been weeks since last time)   Smokeless tobacco: Never   Tobacco comments:    1 cigar per day  Vaping Use   Vaping status: Never Used  Substance Use Topics   Drug use: No      Family History  Problem Relation Age of Onset   Hyperlipidemia Mother    Hypertension Mother    Diabetes Mother    Hypertension Father    Heart disease Father    Diabetes Brother     Allergies  Allergen Reactions   Metformin Diarrhea   Sulfa Antibiotics Nausea And Vomiting and Other (See Comments)    Patient states not allergic   Varicella Virus Vaccine Live Other (See Comments)    Cramps and weakness   Zoster Vac Recomb Adjuvanted     Other reaction(s): Not available   Zoster Vaccine Recombinant, Adjuvanted     Other Reaction(s): Unknown  Other reaction(s): Not available    REVIEW OF SYSTEMS (Negative unless checked)   Constitutional: [] Weight loss  [] Fever  [] Chills Cardiac: [] Chest pain   [] Chest pressure    [] Palpitations   [] Shortness of breath when laying flat   [] Shortness of breath at rest   [] Shortness of breath with exertion. Vascular:  [] Pain in legs with walking   [] Pain in legs at rest   [] Pain in legs when laying flat   [] Claudication   [] Pain in feet when walking  [] Pain in feet at rest  [] Pain in feet when laying flat   [] History of DVT   [] Phlebitis   [] Swelling in legs   [] Varicose veins   [] Non-healing ulcers Pulmonary:   [] Uses home oxygen   [] Productive cough   [] Hemoptysis   [] Wheeze  [] COPD   [] Asthma Neurologic:  [] Dizziness  [] Blackouts   [] Seizures   [] History of stroke   [] History of TIA  [] Aphasia   [] Temporary blindness   [] Dysphagia   [] Weakness or numbness in arms   [] Weakness or numbness in legs Musculoskeletal:  [x] Arthritis   [] Joint swelling   [x] Joint pain   [x] Low back pain Hematologic:  [] Easy bruising  [] Easy bleeding   [] Hypercoagulable state   [] Anemic   Gastrointestinal:  [] Blood in stool   [] Vomiting blood  [] Gastroesophageal reflux/heartburn   [] Abdominal pain Genitourinary:  [] Chronic kidney disease   [] Difficult urination  [x] Frequent urination  [] Burning with urination   [] Hematuria Skin:  [] Rashes   [] Ulcers   [] Wounds Psychological:  [] History of anxiety   []  History of major depression.  Physical Examination  BP 128/75 (BP Location: Left Arm)   Pulse 73   Resp 16   Wt 214 lb (97.1 kg)   BMI 27.48 kg/m  Gen:  WD/WN, NAD Head: /AT, No temporalis wasting. Ear/Nose/Throat: Hearing grossly intact, nares w/o erythema or drainage Eyes: Conjunctiva clear. Sclera non-icteric Neck: Supple.  Trachea midline Pulmonary:  Good air movement, no use of accessory muscles.  Cardiac: RRR, no JVD Vascular:  Vessel Right Left  Radial Palpable Palpable               Musculoskeletal: M/S 5/5 throughout.  Arthritic changes  Neurologic: Sensation grossly intact in extremities.  Symmetrical.  Speech is fluent.  Psychiatric: Judgment intact, Mood & affect  appropriate for pt's clinical situation. Dermatologic: No rashes or ulcers noted.  No cellulitis or open wounds.      Labs Recent Results (from the past 2160 hour(s))  Lipase, blood     Status: Abnormal   Collection Time: 09/11/22 10:59 PM  Result Value Ref Range   Lipase 60 (H) 11 - 51 U/L    Comment: Performed at [facility], [address] [place], [place] [address]  Magnesium  Status: Abnormal   Collection Time: 09/11/22 10:59 PM  Result Value Ref Range   Magnesium 1.5 (L) 1.7 - 2.4 mg/dL    Comment: Performed at [facility], [address] [place], [place] [address]  Troponin I (High Sensitivity)     Status: None   Collection Time: 09/11/22 10:59 PM  Result Value Ref Range   Troponin I (High Sensitivity) 7 <18 ng/L    Comment: (NOTE) Elevated high sensitivity troponin I (hsTnI) values and significant  changes across serial measurements may suggest ACS but many other  chronic and acute conditions are known to elevate hsTnI results.  Refer to the "Links" section for chest pain algorithms and additional  guidance. Performed at [facility], [address] [place], [place] [address]   Comprehensive metabolic panel     Status: Abnormal   Collection Time: 09/11/22 10:59 PM  Result Value Ref Range   Sodium 140 135 - 145 mmol/L   Potassium 3.9 3.5 - 5.1 mmol/L   Chloride 104 98 - 111 mmol/L   CO2 25 22 - 32 mmol/L   Glucose, Bld 218 (H) 70 - 99 mg/dL    Comment: Glucose reference range applies only to samples taken after fasting for at least 8 hours.   BUN 26 (H) 8 - 23 mg/dL   Creatinine, Ser 0.98 (H) 0.61 - 1.24 mg/dL   Calcium 8.3 (L) 8.9 - 10.3 mg/dL   Total Protein 6.8 6.5 - 8.1 g/dL   Albumin 3.6 3.5 - 5.0 g/dL   AST 18 15 - 41 U/L   ALT 10 0 - 44 U/L   Alkaline Phosphatase 47 38 - 126 U/L   Total Bilirubin 0.7 0.3 - 1.2 mg/dL   GFR, Estimated 37 (L) >60 mL/min    Comment: (NOTE) Calculated using the CKD-EPI Creatinine  Equation (2021)    Anion gap 11 5 - 15    Comment: Performed at [facility], [address] [place], [place] [address]  CBC with Differential/Platelet     Status: Abnormal   Collection Time: 09/11/22 10:59 PM  Result Value Ref Range   WBC 19.8 (H) 4.0 - 10.5 K/uL   RBC 4.13 (L) 4.22 - 5.81 MIL/uL   Hemoglobin 12.0 (L) 13.0 - 17.0 g/dL   HCT 78.2 (L) 95.6 - 21.3 %   MCV 92.7 80.0 - 100.0 fL   MCH 29.1 26.0 - 34.0 pg   MCHC 31.3 30.0 - 36.0 g/dL   RDW 08.6 57.8 - 46.9 %   Platelets 250 150 - 400 K/uL   nRBC 0.0 0.0 - 0.2 %   Neutrophils Relative % 87 %   Neutro Abs 17.2 (H) 1.7 - 7.7 K/uL   Lymphocytes Relative 7 %   Lymphs Abs 1.4 0.7 - 4.0 K/uL   Monocytes Relative 5 %   Monocytes Absolute 1.1 (H) 0.1 - 1.0 K/uL   Eosinophils Relative 0 %   Eosinophils Absolute 0.1 0.0 - 0.5 K/uL   Basophils Relative 0 %   Basophils Absolute 0.1 0.0 - 0.1 K/uL   Immature Granulocytes 1 %   Abs Immature Granulocytes 0.14 (H) 0.00 - 0.07 K/uL    Comment: Performed at [facility], [address] [place], [place] [address]  Troponin I (High Sensitivity)     Status: None   Collection Time: 09/12/22  1:47 AM  Result Value Ref Range   Troponin I (High Sensitivity) 7 <18 ng/L    Comment: (NOTE) Elevated high sensitivity  troponin I (hsTnI) values and significant  changes across serial measurements may suggest ACS but many other  chronic and acute conditions are known to elevate hsTnI results.  Refer to the "Links" section for chest pain algorithms and additional  guidance. Performed at [facility], [address] [place], [place] [address]   Urinalysis, Routine w reflex microscopic -Urine, Clean Catch     Status: Abnormal   Collection Time: 09/12/22  2:38 AM  Result Value Ref Range   Color, Urine YELLOW (A) YELLOW   APPearance HAZY (A) CLEAR   Specific Gravity, Urine 1.038 (H) 1.005 - 1.030   pH 5.0 5.0 - 8.0   Glucose, UA NEGATIVE NEGATIVE mg/dL   Hgb  urine dipstick NEGATIVE NEGATIVE   Bilirubin Urine NEGATIVE NEGATIVE   Ketones, ur NEGATIVE NEGATIVE mg/dL   Protein, ur 30 (A) NEGATIVE mg/dL   Nitrite NEGATIVE NEGATIVE   Leukocytes,Ua NEGATIVE NEGATIVE   RBC / HPF 0-5 0 - 5 RBC/hpf   WBC, UA 11-20 0 - 5 WBC/hpf   Bacteria, UA NONE SEEN NONE SEEN   Squamous Epithelial / HPF 0-5 0 - 5 /HPF   Mucus PRESENT    Hyaline Casts, UA PRESENT     Comment: Performed at [facility], [address] [place], [place] [address]  Urinalysis, Complete     Status: None   Collection Time: 09/27/22  1:08 PM  Result Value Ref Range   Specific Gravity, UA 1.020 1.005 - 1.030   pH, UA 5.5 5.0 - 7.5   Color, UA Yellow Yellow   Appearance Ur Clear Clear   Leukocytes,UA Negative Negative   Protein,UA Negative Negative/Trace   Glucose, UA Negative Negative   Ketones, UA Negative Negative   RBC, UA Negative Negative   Bilirubin, UA Negative Negative   Urobilinogen, Ur 0.2 0.2 - 1.0 mg/dL   Nitrite, UA Negative Negative   Microscopic Examination See below:   Microscopic Examination     Status: None   Collection Time: 09/27/22  1:08 PM   Urine  Result Value Ref Range   WBC, UA 0-5 0 - 5 /hpf   RBC, Urine 0-2 0 - 2 /hpf   Epithelial Cells (non renal) 0-10 0 - 10 /hpf   Bacteria, UA Few None seen/Few  Bladder Scan (Post Void Residual) in office     Status: None   Collection Time: 09/27/22  1:17 PM  Result Value Ref Range   Scan Result 398 ml   Creatinine, serum     Status: Abnormal   Collection Time: 09/27/22  2:02 PM  Result Value Ref Range   Creatinine, Ser 1.31 (H) 0.76 - 1.27 mg/dL   eGFR 54 (L) >82 NF/AOZ/3.08    Radiology No results found.  Assessment/Plan HTN (hypertension) blood pressure control important in reducing the progression of atherosclerotic disease. On appropriate oral medications.     Diabetes (HCC) blood glucose control important in reducing the progression of atherosclerotic disease. Also, involved in wound  healing. On appropriate medications.   Intracranial aneurysm Follows with neurosurgery   Iliac artery aneurysm (HCC) His duplex today shows stable aortic diameter of approximately 4 cm.  The stent graft is widely patent and there is no endoleak apparent.  We will continue to follow this on an annual basis with duplex.  Contact our office with any problems in the interim.    Festus Barren, MD  10/28/2022 8:36 AM    This note was created with Dragon medical transcription system.  Any errors from dictation are purely unintentional

## 2022-12-12 ENCOUNTER — Other Ambulatory Visit: Payer: Self-pay

## 2022-12-12 ENCOUNTER — Inpatient Hospital Stay
Admission: EM | Admit: 2022-12-12 | Discharge: 2022-12-17 | DRG: 177 | Disposition: A | Discharge status: Home Health | Payer: Medicare HMO | Attending: Internal Medicine | Admitting: Internal Medicine

## 2022-12-12 ENCOUNTER — Emergency Department: Payer: Medicare HMO

## 2022-12-12 DIAGNOSIS — N1832 Chronic kidney disease, stage 3b: Secondary | ICD-10-CM | POA: Diagnosis not present

## 2022-12-12 DIAGNOSIS — E872 Acidosis, unspecified: Secondary | ICD-10-CM | POA: Diagnosis present

## 2022-12-12 DIAGNOSIS — Z7985 Long-term (current) use of injectable non-insulin antidiabetic drugs: Secondary | ICD-10-CM

## 2022-12-12 DIAGNOSIS — I959 Hypotension, unspecified: Secondary | ICD-10-CM | POA: Diagnosis not present

## 2022-12-12 DIAGNOSIS — F17290 Nicotine dependence, other tobacco product, uncomplicated: Secondary | ICD-10-CM | POA: Diagnosis present

## 2022-12-12 DIAGNOSIS — Z888 Allergy status to other drugs, medicaments and biological substances status: Secondary | ICD-10-CM

## 2022-12-12 DIAGNOSIS — A419 Sepsis, unspecified organism: Secondary | ICD-10-CM | POA: Diagnosis not present

## 2022-12-12 DIAGNOSIS — Y92009 Unspecified place in unspecified non-institutional (private) residence as the place of occurrence of the external cause: Secondary | ICD-10-CM | POA: Diagnosis not present

## 2022-12-12 DIAGNOSIS — Z8249 Family history of ischemic heart disease and other diseases of the circulatory system: Secondary | ICD-10-CM

## 2022-12-12 DIAGNOSIS — I440 Atrioventricular block, first degree: Secondary | ICD-10-CM | POA: Diagnosis present

## 2022-12-12 DIAGNOSIS — E1165 Type 2 diabetes mellitus with hyperglycemia: Secondary | ICD-10-CM | POA: Diagnosis present

## 2022-12-12 DIAGNOSIS — J9601 Acute respiratory failure with hypoxia: Secondary | ICD-10-CM

## 2022-12-12 DIAGNOSIS — Z96652 Presence of left artificial knee joint: Secondary | ICD-10-CM | POA: Diagnosis present

## 2022-12-12 DIAGNOSIS — W010XXA Fall on same level from slipping, tripping and stumbling without subsequent striking against object, initial encounter: Secondary | ICD-10-CM | POA: Diagnosis present

## 2022-12-12 DIAGNOSIS — Z833 Family history of diabetes mellitus: Secondary | ICD-10-CM

## 2022-12-12 DIAGNOSIS — N179 Acute kidney failure, unspecified: Secondary | ICD-10-CM | POA: Diagnosis present

## 2022-12-12 DIAGNOSIS — Z7984 Long term (current) use of oral hypoglycemic drugs: Secondary | ICD-10-CM | POA: Diagnosis not present

## 2022-12-12 DIAGNOSIS — I739 Peripheral vascular disease, unspecified: Secondary | ICD-10-CM | POA: Diagnosis not present

## 2022-12-12 DIAGNOSIS — N400 Enlarged prostate without lower urinary tract symptoms: Secondary | ICD-10-CM | POA: Diagnosis present

## 2022-12-12 DIAGNOSIS — N1831 Chronic kidney disease, stage 3a: Secondary | ICD-10-CM | POA: Diagnosis present

## 2022-12-12 DIAGNOSIS — J690 Pneumonitis due to inhalation of food and vomit: Principal | ICD-10-CM | POA: Insufficient documentation

## 2022-12-12 DIAGNOSIS — R571 Hypovolemic shock: Secondary | ICD-10-CM | POA: Insufficient documentation

## 2022-12-12 DIAGNOSIS — L89896 Pressure-induced deep tissue damage of other site: Secondary | ICD-10-CM | POA: Diagnosis present

## 2022-12-12 DIAGNOSIS — I452 Bifascicular block: Secondary | ICD-10-CM | POA: Diagnosis present

## 2022-12-12 DIAGNOSIS — R579 Shock, unspecified: Secondary | ICD-10-CM

## 2022-12-12 DIAGNOSIS — E785 Hyperlipidemia, unspecified: Secondary | ICD-10-CM | POA: Diagnosis present

## 2022-12-12 DIAGNOSIS — I472 Ventricular tachycardia, unspecified: Secondary | ICD-10-CM | POA: Diagnosis present

## 2022-12-12 DIAGNOSIS — R6521 Severe sepsis with septic shock: Secondary | ICD-10-CM | POA: Diagnosis present

## 2022-12-12 DIAGNOSIS — K8689 Other specified diseases of pancreas: Secondary | ICD-10-CM | POA: Diagnosis present

## 2022-12-12 DIAGNOSIS — E1122 Type 2 diabetes mellitus with diabetic chronic kidney disease: Secondary | ICD-10-CM | POA: Diagnosis present

## 2022-12-12 DIAGNOSIS — G20A1 Parkinson's disease without dyskinesia, without mention of fluctuations: Secondary | ICD-10-CM | POA: Diagnosis present

## 2022-12-12 DIAGNOSIS — I517 Cardiomegaly: Secondary | ICD-10-CM | POA: Diagnosis not present

## 2022-12-12 DIAGNOSIS — E1151 Type 2 diabetes mellitus with diabetic peripheral angiopathy without gangrene: Secondary | ICD-10-CM | POA: Diagnosis present

## 2022-12-12 DIAGNOSIS — Z85828 Personal history of other malignant neoplasm of skin: Secondary | ICD-10-CM

## 2022-12-12 DIAGNOSIS — T17908A Unspecified foreign body in respiratory tract, part unspecified causing other injury, initial encounter: Secondary | ICD-10-CM | POA: Diagnosis not present

## 2022-12-12 DIAGNOSIS — Z96649 Presence of unspecified artificial hip joint: Secondary | ICD-10-CM | POA: Diagnosis present

## 2022-12-12 DIAGNOSIS — R9431 Abnormal electrocardiogram [ECG] [EKG]: Secondary | ICD-10-CM | POA: Diagnosis not present

## 2022-12-12 DIAGNOSIS — I10 Essential (primary) hypertension: Secondary | ICD-10-CM | POA: Diagnosis not present

## 2022-12-12 DIAGNOSIS — Z515 Encounter for palliative care: Secondary | ICD-10-CM | POA: Diagnosis not present

## 2022-12-12 DIAGNOSIS — K591 Functional diarrhea: Secondary | ICD-10-CM | POA: Diagnosis not present

## 2022-12-12 DIAGNOSIS — R338 Other retention of urine: Secondary | ICD-10-CM | POA: Diagnosis present

## 2022-12-12 DIAGNOSIS — Z8546 Personal history of malignant neoplasm of prostate: Secondary | ICD-10-CM

## 2022-12-12 DIAGNOSIS — L89326 Pressure-induced deep tissue damage of left buttock: Secondary | ICD-10-CM | POA: Diagnosis present

## 2022-12-12 DIAGNOSIS — I671 Cerebral aneurysm, nonruptured: Secondary | ICD-10-CM | POA: Diagnosis present

## 2022-12-12 DIAGNOSIS — E860 Dehydration: Secondary | ICD-10-CM | POA: Diagnosis present

## 2022-12-12 DIAGNOSIS — I129 Hypertensive chronic kidney disease with stage 1 through stage 4 chronic kidney disease, or unspecified chronic kidney disease: Secondary | ICD-10-CM | POA: Diagnosis present

## 2022-12-12 DIAGNOSIS — N401 Enlarged prostate with lower urinary tract symptoms: Secondary | ICD-10-CM | POA: Diagnosis present

## 2022-12-12 DIAGNOSIS — Z882 Allergy status to sulfonamides status: Secondary | ICD-10-CM

## 2022-12-12 DIAGNOSIS — R55 Syncope and collapse: Secondary | ICD-10-CM | POA: Diagnosis present

## 2022-12-12 DIAGNOSIS — I9589 Other hypotension: Secondary | ICD-10-CM | POA: Diagnosis present

## 2022-12-12 DIAGNOSIS — R000 Tachycardia, unspecified: Secondary | ICD-10-CM

## 2022-12-12 DIAGNOSIS — Z79899 Other long term (current) drug therapy: Secondary | ICD-10-CM

## 2022-12-12 DIAGNOSIS — L89316 Pressure-induced deep tissue damage of right buttock: Secondary | ICD-10-CM | POA: Diagnosis present

## 2022-12-12 DIAGNOSIS — E861 Hypovolemia: Secondary | ICD-10-CM

## 2022-12-12 DIAGNOSIS — J189 Pneumonia, unspecified organism: Secondary | ICD-10-CM | POA: Diagnosis not present

## 2022-12-12 DIAGNOSIS — Z83438 Family history of other disorder of lipoprotein metabolism and other lipidemia: Secondary | ICD-10-CM

## 2022-12-12 LAB — COMPREHENSIVE METABOLIC PANEL
ALT: 11 U/L (ref 0–44)
AST: 20 U/L (ref 15–41)
Albumin: 3.4 g/dL — ABNORMAL LOW (ref 3.5–5.0)
Alkaline Phosphatase: 49 U/L (ref 38–126)
Anion gap: 14 (ref 5–15)
BUN: 29 mg/dL — ABNORMAL HIGH (ref 8–23)
CO2: 21 mmol/L — ABNORMAL LOW (ref 22–32)
Calcium: 9.1 mg/dL (ref 8.9–10.3)
Chloride: 103 mmol/L (ref 98–111)
Creatinine, Ser: 1.66 mg/dL — ABNORMAL HIGH (ref 0.61–1.24)
GFR, Estimated: 40 mL/min — ABNORMAL LOW (ref >60)
Glucose, Bld: 201 mg/dL — ABNORMAL HIGH (ref 70–99)
Potassium: 3.5 mmol/L (ref 3.5–5.1)
Sodium: 138 mmol/L (ref 135–145)
Total Bilirubin: 0.3 mg/dL (ref 0.3–1.2)
Total Protein: 6.8 g/dL (ref 6.5–8.1)

## 2022-12-12 LAB — C DIFFICILE QUICK SCREEN W PCR REFLEX
C Diff antigen: NEGATIVE
C Diff interpretation: NOT DETECTED
C Diff toxin: NEGATIVE

## 2022-12-12 LAB — MRSA NEXT GEN BY PCR, NASAL: MRSA by PCR Next Gen: NOT DETECTED

## 2022-12-12 LAB — CK: Total CK: 51 U/L (ref 49–397)

## 2022-12-12 LAB — TROPONIN I (HIGH SENSITIVITY)
Troponin I (High Sensitivity): 6 ng/L (ref <18)
Troponin I (High Sensitivity): 6 ng/L (ref <18)

## 2022-12-12 LAB — MAGNESIUM: Magnesium: 2.1 mg/dL (ref 1.7–2.4)

## 2022-12-12 LAB — CBC WITH DIFFERENTIAL/PLATELET
Abs Immature Granulocytes: 0.1 10*3/uL — ABNORMAL HIGH (ref 0.00–0.07)
Basophils Absolute: 0 10*3/uL (ref 0.0–0.1)
Basophils Relative: 0 %
Eosinophils Absolute: 0.2 10*3/uL (ref 0.0–0.5)
Eosinophils Relative: 2 %
HCT: 44.8 % (ref 39.0–52.0)
Hemoglobin: 14.2 g/dL (ref 13.0–17.0)
Immature Granulocytes: 1 %
Lymphocytes Relative: 32 %
Lymphs Abs: 4.6 10*3/uL — ABNORMAL HIGH (ref 0.7–4.0)
MCH: 29.3 pg (ref 26.0–34.0)
MCHC: 31.7 g/dL (ref 30.0–36.0)
MCV: 92.4 fL (ref 80.0–100.0)
Monocytes Absolute: 0.7 10*3/uL (ref 0.1–1.0)
Monocytes Relative: 5 %
Neutro Abs: 8.7 10*3/uL — ABNORMAL HIGH (ref 1.7–7.7)
Neutrophils Relative %: 60 %
Platelets: 231 10*3/uL (ref 150–400)
RBC: 4.85 MIL/uL (ref 4.22–5.81)
RDW: 14 % (ref 11.5–15.5)
WBC: 14.3 10*3/uL — ABNORMAL HIGH (ref 4.0–10.5)
nRBC: 0 % (ref 0.0–0.2)

## 2022-12-12 LAB — GLUCOSE, CAPILLARY: Glucose-Capillary: 176 mg/dL — ABNORMAL HIGH (ref 70–99)

## 2022-12-12 LAB — GASTROINTESTINAL PANEL BY PCR, STOOL (REPLACES STOOL CULTURE)

## 2022-12-12 LAB — LIPASE, BLOOD: Lipase: 49 U/L (ref 11–51)

## 2022-12-12 LAB — LACTIC ACID, PLASMA
Lactic Acid, Venous: 4.5 mmol/L (ref 0.5–1.9)
Lactic Acid, Venous: 3.3 mmol/L (ref 0.5–1.9)
Lactic Acid, Venous: 2.7 mmol/L (ref 0.5–1.9)

## 2022-12-12 LAB — PHOSPHORUS: Phosphorus: 3.2 mg/dL (ref 2.5–4.6)

## 2022-12-12 MED ORDER — SODIUM CHLORIDE 0.9 % IV BOLUS
1000.0000 mL | Freq: Once | INTRAVENOUS | Status: AC
  Administered 2022-12-12: 1000 mL via INTRAVENOUS

## 2022-12-12 MED ORDER — SODIUM CHLORIDE 0.9 % IV SOLN
250.0000 mL | INTRAVENOUS | Status: DC
  Administered 2022-12-12: 250 mL via INTRAVENOUS

## 2022-12-12 MED ORDER — POTASSIUM CHLORIDE 10 MEQ/100ML IV SOLN
10.0000 meq | INTRAVENOUS | Status: AC
  Administered 2022-12-12 (×2): 10 meq via INTRAVENOUS
  Filled 2022-12-12 (×2): qty 100

## 2022-12-12 MED ORDER — IOHEXOL 300 MG/ML  SOLN
80.0000 mL | Freq: Once | INTRAMUSCULAR | Status: AC | PRN
  Administered 2022-12-12: 80 mL via INTRAVENOUS

## 2022-12-12 MED ORDER — VANCOMYCIN HCL IN DEXTROSE 1-5 GM/200ML-% IV SOLN: 1000.0000 mg | Freq: Once | INTRAVENOUS | Status: DC

## 2022-12-12 MED ORDER — ONDANSETRON HCL 4 MG/2ML IJ SOLN
4.0000 mg | Freq: Once | INTRAMUSCULAR | Status: AC
  Administered 2022-12-12: 4 mg via INTRAVENOUS
  Filled 2022-12-12: qty 2

## 2022-12-12 MED ORDER — ACETAMINOPHEN 325 MG PO TABS
650.0000 mg | ORAL_TABLET | Freq: Three times a day (TID) | ORAL | Status: DC | PRN
  Administered 2022-12-13 – 2022-12-15 (×3): 650 mg via ORAL
  Filled 2022-12-12 (×3): qty 2

## 2022-12-12 MED ORDER — LACTATED RINGERS IV SOLN: INTRAVENOUS | Status: DC

## 2022-12-12 MED ORDER — FINASTERIDE 5 MG PO TABS
5.0000 mg | ORAL_TABLET | Freq: Every day | ORAL | Status: DC
  Administered 2022-12-13 – 2022-12-17 (×5): 5 mg via ORAL
  Filled 2022-12-12 (×5): qty 1

## 2022-12-12 MED ORDER — NOREPINEPHRINE 4 MG/250ML-% IV SOLN
2.0000 ug/min | INTRAVENOUS | Status: DC
  Administered 2022-12-12: 2 ug/min via INTRAVENOUS
  Filled 2022-12-12: qty 250

## 2022-12-12 MED ORDER — CHLORHEXIDINE GLUCONATE CLOTH 2 % EX PADS: 6.0000 | MEDICATED_PAD | Freq: Every day | CUTANEOUS | Status: DC

## 2022-12-12 MED ORDER — SODIUM CHLORIDE 0.9 % IV SOLN: 3.0000 g | Freq: Once | INTRAVENOUS | Status: DC

## 2022-12-12 MED ORDER — PANTOPRAZOLE SODIUM 40 MG IV SOLR
40.0000 mg | INTRAVENOUS | Status: DC
  Administered 2022-12-13 – 2022-12-16 (×4): 40 mg via INTRAVENOUS
  Filled 2022-12-12 (×4): qty 10

## 2022-12-12 MED ORDER — LOPERAMIDE HCL 2 MG PO CAPS
2.0000 mg | ORAL_CAPSULE | Freq: Four times a day (QID) | ORAL | Status: DC | PRN
  Administered 2022-12-13 – 2022-12-16 (×4): 2 mg via ORAL
  Filled 2022-12-12 (×5): qty 1

## 2022-12-12 MED ORDER — POLYETHYLENE GLYCOL 3350 17 G PO PACK: 17.0000 g | PACK | Freq: Every day | ORAL | Status: DC | PRN

## 2022-12-12 MED ORDER — SODIUM CHLORIDE 0.9 % IV SOLN
2.0000 g | Freq: Once | INTRAVENOUS | Status: AC
  Administered 2022-12-12: 2 g via INTRAVENOUS
  Filled 2022-12-12: qty 12.5

## 2022-12-12 MED ORDER — LACTATED RINGERS IV BOLUS
1000.0000 mL | Freq: Once | INTRAVENOUS | Status: AC
  Administered 2022-12-12: 1000 mL via INTRAVENOUS

## 2022-12-12 MED ORDER — METRONIDAZOLE 500 MG/100ML IV SOLN
500.0000 mg | Freq: Once | INTRAVENOUS | Status: AC
  Administered 2022-12-12: 500 mg via INTRAVENOUS
  Filled 2022-12-12: qty 100

## 2022-12-12 MED ORDER — ENOXAPARIN SODIUM 40 MG/0.4ML IJ SOSY
40.0000 mg | PREFILLED_SYRINGE | INTRAMUSCULAR | Status: DC
  Administered 2022-12-12 – 2022-12-16 (×5): 40 mg via SUBCUTANEOUS
  Filled 2022-12-12 (×5): qty 0.4

## 2022-12-12 MED ORDER — LACTATED RINGERS IV BOLUS
250.0000 mL | Freq: Once | INTRAVENOUS | Status: AC
  Administered 2022-12-12: 250 mL via INTRAVENOUS

## 2022-12-12 MED ORDER — VANCOMYCIN HCL 2000 MG/400ML IV SOLN
2000.0000 mg | Freq: Once | INTRAVENOUS | Status: AC
  Administered 2022-12-12: 2000 mg via INTRAVENOUS
  Filled 2022-12-12: qty 400

## 2022-12-12 MED ORDER — ROSUVASTATIN CALCIUM 20 MG PO TABS
20.0000 mg | ORAL_TABLET | Freq: Every day | ORAL | Status: DC
  Administered 2022-12-13 – 2022-12-17 (×5): 20 mg via ORAL
  Filled 2022-12-12: qty 2
  Filled 2022-12-12 (×2): qty 1
  Filled 2022-12-12: qty 2
  Filled 2022-12-12: qty 1

## 2022-12-12 MED ORDER — LAMOTRIGINE 25 MG PO TABS
25.0000 mg | ORAL_TABLET | Freq: Every day | ORAL | Status: DC
  Administered 2022-12-14 – 2022-12-17 (×4): 25 mg via ORAL
  Filled 2022-12-12 (×4): qty 1

## 2022-12-12 MED ORDER — DOCUSATE SODIUM 100 MG PO CAPS: 100.0000 mg | ORAL_CAPSULE | Freq: Two times a day (BID) | ORAL | Status: DC | PRN

## 2022-12-12 NOTE — ED Triage Notes (Signed)
Patient presents to the ED via [facility] due to a fall. Patient does not recall the fall, but ems states that patient hit his head per wife. In addition, EMS states that patient was covered in vomit upon arrival. While EMS was attempting to get patient out of the floor-patient had a period of unresponsiveness accompanied and defecation. EMS states patient had a color of gray and diaphoretic.  20 g l wrist.

## 2022-12-12 NOTE — ED Notes (Signed)
Informed provider of lactic acid critical.

## 2022-12-12 NOTE — Progress Notes (Signed)
CODE SEPSIS - PHARMACY COMMUNICATION  **Broad Spectrum Antibiotics should be administered within 1 hour of Sepsis diagnosis**  Time Code Sepsis Called/Page Received: 1727  Antibiotics Ordered: vancomycin, cefepime, Flagyl   Time of 1st antibiotic administration: 1749  Gardner Candle, PharmD, BCPS Clinical Pharmacist 12/12/2022 5:55 PM

## 2022-12-12 NOTE — Sepsis Progress Note (Signed)
[facility]link will follow per sepsis protocol  

## 2022-12-12 NOTE — ED Provider Notes (Signed)
[facility] Provider Note    Event Date/Time   First MD Initiated Contact with Patient 12/12/22 1543     (approximate)   History   Chief Complaint Fall and Loss of Consciousness   HPI  MARTEL CHEZEM is a 82 y.o. male with past medical history of hypertension, diabetes, and CKD who presents to the ED for syncope.  Patient reports that he remembers trying to go to the bathroom today, the next thing he knew he was waking up with vomiting and diarrhea on himself.  Per EMS, patient fell at home on the way to the bathroom, but is unclear whether he lost consciousness before or after the fall.  When EMS arrived, they attempted to get patient up to his walker, when he again lost consciousness and had an episode of vomiting and diarrhea.  He remained unresponsive for a couple of minutes before improving during transport.  On arrival, he complains of some pain in his abdomen diffusely and continues to feel nauseous, actively having diarrhea on arrival.  He denies any pain in his chest and has not had any fever, cough, difficulty breathing, or chest pain.  He denies any headache or neck pain, does not take any blood thinners.     Physical Exam   Triage Vital Signs: ED Triage Vitals [12/12/22 1543]  Encounter Vitals Group     BP      Systolic BP Percentile      Diastolic BP Percentile      Pulse      Resp      Temp      Temp src      SpO2      Weight 220 lb (99.8 kg)     Height 6\' 3"  (1.905 m)     Head Circumference      Peak Flow      Pain Score      Pain Loc      Pain Education      Exclude from Growth Chart     Most recent vital signs: Vitals:   12/12/22 1900 12/12/22 1911  BP: 95/77   Pulse: (!) 102   Resp: 18   Temp:  97.8 F (36.6 C)  SpO2: 100%     Constitutional: Alert and oriented. Eyes: Conjunctivae are normal. Head: Atraumatic. Nose: No congestion/rhinnorhea. Mouth/Throat: Mucous membranes are moist.  Neck: No midline cervical  spine tenderness to palpation. Cardiovascular: Normal rate, regular rhythm. Grossly normal heart sounds.  2+ radial pulses bilaterally. Respiratory: Normal respiratory effort.  No retractions. Lungs CTAB.  No chest wall tenderness to palpation. Gastrointestinal: Soft and diffusely tender to palpation with no rebound or guarding. No distention. Musculoskeletal: No lower extremity tenderness nor edema.  No upper extremity bony tenderness to palpation.  Range of motion intact to all 4 extremities. Neurologic:  Normal speech and language. No gross focal neurologic deficits are appreciated.    ED Results / Procedures / Treatments   Labs (all labs ordered are listed, but only abnormal results are displayed) Labs Reviewed  CBC WITH DIFFERENTIAL/PLATELET - Abnormal; Notable for the following components:      Result Value   WBC 14.3 (*)    Neutro Abs 8.7 (*)    Lymphs Abs 4.6 (*)    Abs Immature Granulocytes 0.10 (*)    All other components within normal limits  COMPREHENSIVE METABOLIC PANEL - Abnormal; Notable for the following components:   CO2 21 (*)    Glucose, Bld  201 (*)    BUN 29 (*)    Creatinine, Ser 1.66 (*)    Albumin 3.4 (*)    GFR, Estimated 40 (*)    All other components within normal limits  LACTIC ACID, PLASMA - Abnormal; Notable for the following components:   Lactic Acid, Venous 4.5 (*)    All other components within normal limits  LACTIC ACID, PLASMA - Abnormal; Notable for the following components:   Lactic Acid, Venous 3.3 (*)    All other components within normal limits  C DIFFICILE QUICK SCREEN W PCR REFLEX    CULTURE, BLOOD (ROUTINE X 2)  CULTURE, BLOOD (ROUTINE X 2)  GASTROINTESTINAL PANEL BY PCR, STOOL (REPLACES STOOL CULTURE)  LIPASE, BLOOD  MAGNESIUM  URINALYSIS, ROUTINE W REFLEX MICROSCOPIC  TROPONIN I (HIGH SENSITIVITY)  TROPONIN I (HIGH SENSITIVITY)     EKG  ED ECG REPORT I, Chesley Noon, the attending physician, personally viewed and  interpreted this ECG.   Date: 12/12/2022  EKG Time: 15:43  Rate: 60  Rhythm: normal sinus rhythm  Axis: Normal  Intervals:right bundle branch block  ST&T Change: None  ED ECG REPORT I, Chesley Noon, the attending physician, personally viewed and interpreted this ECG.   Date: 12/12/2022  EKG Time: 16:02  Rate: 117  Rhythm: Ventricular rhythm  Axis: LAD  Intervals:nonspecific intraventricular conduction delay  ST&T Change: None   RADIOLOGY CT head reviewed and interpreted by me with significant aneurysm but no hemorrhage or midline shift.  PROCEDURES:  Critical Care performed: Yes, see critical care procedure note(s)  .Critical Care  Performed by: Chesley Noon, MD Authorized by: Chesley Noon, MD   Critical care provider statement:    Critical care time (minutes):  75   Critical care time was exclusive of:  Separately billable procedures and treating other patients and teaching time   Critical care was necessary to treat or prevent imminent or life-threatening deterioration of the following conditions:  Respiratory failure, cardiac failure and sepsis   Critical care was time spent personally by me on the following activities:  Development of treatment plan with patient or surrogate, discussions with consultants, evaluation of patient's response to treatment, examination of patient, ordering and review of laboratory studies, ordering and review of radiographic studies, ordering and performing treatments and interventions, pulse oximetry, re-evaluation of patient's condition and review of old charts   I assumed direction of critical care for this patient from another provider in my specialty: no     Care discussed with: admitting provider      MEDICATIONS ORDERED IN ED: Medications  vancomycin (VANCOREADY) IVPB 2000 mg/400 mL (2,000 mg Intravenous New Bag/Given 12/12/22 1831)  potassium chloride 10 mEq in 100 mL IVPB (10 mEq Intravenous New Bag/Given 12/12/22 1929)  0.9  %  sodium chloride infusion (250 mLs Intravenous New Bag/Given 12/12/22 1934)  norepinephrine (LEVOPHED) 4mg  in (0.016 mg/mL) premix infusion (2 mcg/min Intravenous New Bag/Given 12/12/22 1941)  ondansetron (ZOFRAN) injection 4 mg (4 mg Intravenous Given 12/12/22 1556)  lactated ringers bolus 1,000 mL (0 mLs Intravenous Stopped 12/12/22 1642)  iohexol (OMNIPAQUE) 300 MG/ML solution 80 mL (80 mLs Intravenous Contrast Given 12/12/22 1648)  sodium chloride 0.9 % bolus 1,000 mL (0 mLs Intravenous Stopped 12/12/22 1912)  ceFEPIme (MAXIPIME) 2 g in sodium chloride 0.9 % 100 mL IVPB (0 g Intravenous Stopped 12/12/22 1819)  metroNIDAZOLE (FLAGYL) IVPB 500 mg (0 mg Intravenous Stopped 12/12/22 1859)  lactated ringers bolus 1,000 mL (0 mLs Intravenous Stopped 12/12/22 1912)  ondansetron (ZOFRAN) injection 4 mg (4 mg Intravenous Given 12/12/22 1828)     IMPRESSION / MDM / ASSESSMENT AND PLAN / ED COURSE  I reviewed the triage vital signs and the nursing notes.                              82 y.o. male with past medical history of hypertension, diabetes, and CKD who presents to the ED with 2 episodes of loss of consciousness, resulting in a fall where patient reportedly struck his head, now complains of abdominal pain, vomiting, and diarrhea.  Patient's presentation is most consistent with acute presentation with potential threat to life or bodily function.  Differential diagnosis includes, but is not limited to, arrhythmia, vasovagal episode, anemia, electrolyte abnormality, AKI, dehydration, sepsis, UTI, pneumonia, gastroenteritis, ACS.  Patient ill-appearing but in no acute distress, vital signs remarkable for hypotension and hypoxia with room air sats of 89%.  Patient placed on 2 L nasal cannula with improvement, will hydrate with IV fluids for his hypotension.  EKG shows no evidence of arrhythmia or ischemia, labs are pending at this time and we will check for traumatic injury with CT head and cervical spine.  He  does have diffuse tenderness of his abdomen and with question of both sepsis and traumatic injury, will check CT of chest/abdomen/pelvis.  Patient noted to have rhythm change on monitor, follow-up EKG shows wide-complex rhythm with rate in the 110s.  This was reviewed with Dr. Mariah Milling of cardiology, rhythm quickly resolved and no intervention needed at this time but will continue to monitor and Dr. Mariah Milling recommends pressors as needed.  CT head shows cerebral aneurysm, enlarging compared to previous.  CT cervical spine negative for acute traumatic injury.  Aneurysm was discussed with Dr. Katrinka Blazing of neurosurgery, who states he will see the patient tomorrow but does not feel this is related to his presentation today and does not recommend CTA or any other intervention at this time.  CT of chest/abdomen/pelvis shows evidence of aspiration, patient continues to be comfortable on 2 L nasal cannula.  There is also hydroureteronephrosis on the right with no clear obstructing process, could be contributing to infection however patient already received broad-spectrum antibiotics to treat this.  He does meet sepsis criteria based off of heart rate and leukocytosis.  Additional labs show stable CKD and elevated lactic acid, downtrending following 30 cc/kg of IV fluids.  Initially his blood pressure improved, however on reassessment, patient is once again hypotensive with MAP less than 65.  We will start him on IV Levophed and case discussed ICU team for admission.      FINAL CLINICAL IMPRESSION(S) / ED DIAGNOSES   Final diagnoses:  Syncope, unspecified syncope type  Aspiration into airway, initial encounter  Acute respiratory failure with hypoxia (HCC)     Rx / DC Orders   ED Discharge Orders     None        Note:  This document was prepared using Dragon voice recognition software and may include unintentional dictation errors.   Chesley Noon, MD 12/12/22 1946

## 2022-12-12 NOTE — ED Notes (Signed)
Attempted to call report

## 2022-12-12 NOTE — Progress Notes (Deleted)
Attending Note Patient seen and examined, agree with detailed note above,   Patient presentation and plan discussed on rounds.   Cardiology consult placed by Dr.    EKG lab work, chest x-ray, echocardiogram reviewed independently by myself  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  He made a noise, she called EMS EMS attempted to get him up to his walker when he lost consciousness and had episode of vomiting and diarrhea He remained unresponsive for several minutes before improving Reported some abdominal discomfort, nausea, diarrhea on arrival to the ER Currently reports feeling cold, weak, tremors In the ER noted to have right bundle branch block on EKG, also noted to have short episodes of wide-complex rhythm concerning for left bundle branch block Hypertensive on arrival with improvement after IV fluids, antibiotics Initial blood pressure 60/49, on rounds pressures in the high 90s systolic, mentating well  Seen in the emergency room September 11, 2022, nausea, vomit, diarrhea x 4 Felt to have foodborne gastroenteritis  Admission to [facility] May 2024 for syncope, hypotension, gastroenteritis, possible sepsis, strep viridans on blood culture, discharged with PICC line ceftriaxone 14 days  On examination : Appears pale, alert oriented, no JVD, lungs clear to auscultation bilaterally, heart  sounds regular normal S1-S2 no murmurs appreciated, abdomen soft nontender no significant lower extremity edema.  Musculoskeletal exam with good range of motion, neurologic exam grossly nonfocal     Latest Ref Rng & Units 12/12/2022    3:50 PM 09/11/2022   10:59 PM 05/20/2022    4:41 AM  CBC  WBC 4.0 - 10.5 K/uL 14.3  19.8  9.3   Hemoglobin 13.0 - 17.0 g/dL 16.1  09.6  04.5   Hematocrit 39.0 - 52.0 % 44.8  38.3  33.3   Platelets 150 - 400 K/uL 231  250  149       Latest Ref Rng & Units 12/12/2022    3:50 PM 09/27/2022    2:02 PM 09/11/2022   10:59 PM  BMP  Glucose 70 - 99 mg/dL 409   811   BUN 8 - 23 mg/dL 29   26   Creatinine 9.14 - 1.24 mg/dL 7.82  9.56  2.13   Sodium 135 - 145 mmol/L 138   140   Potassium 3.5 - 5.1 mmol/L 3.5   3.9   Chloride 98 - 111 mmol/L 103   104   CO2 22 - 32 mmol/L 21   25   Calcium 8.9 - 10.3 mg/dL 9.1   8.3    Troponin negative  EKG personally reviewed by myself on todays visit Normal sinus rhythm first-degree AV block with right bundle branch block rate 60 bpm  Repeat EKG 4 PM showing wide-complex rhythm rate 117 bpm concerning for idioventricular rhythm in the left bundle branch pattern  A/p: Gastroenteritis/hypotension/weakness/sepsis Unable to exclude vaso vagal event causing syncope in the setting of diarrhea Similar episodes in May 2024, June  2024 with similar presentations Has responded so well to IV fluids with improved blood pressure Agree with blood cultures, broad-spectrum antibiotics Gastroenteritis panel pending including C. difficile  Wide-complex rhythm/left bundle In the setting of hypotension, diarrhea/gastroenteritis, sepsis Brief episodes, not clearly associated with hypotensive episodes For recurrent arrhythmia, would monitor blood pressure, Could consider pressors for hypotension No strong indication at this time for temporary wire/permanent pacing Will replete potassium by IV Magnesium stable Prior echo with normal LV  function -Zio monitor at discharge to determine if continued paroxysmal arrhythmia  Chronic renal failure Slight worsening of renal dysfunction in the setting of diarrhea, prerenal state Agree with IV fluids, recheck BMP tomorrow  Diabetes type 2  Essential hypertension On lisinopril, hydrochlorothiazide At discharge consider holding HCTZ given frequent episodes of hypovolemia, change to amlodipine  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 [facility]

## 2022-12-12 NOTE — H&P (Incomplete)
NAME:  Scott Buckley, MRN:  [id number], DOB:  [birth year], LOS: 0 ADMISSION DATE:  12/12/2022, CONSULTATION DATE:  12/12/22 REFERRING MD:  Dr. Larinda Buttery, CHIEF COMPLAINT:  Syncope and collapse   History of Present Illness:  82 year old male presenting to [facility] ED from home via EMS on 12/12/2022 for evaluation of syncope and collapse.  History provided per chart review & bedside interview with patient and spouse. Patient has been experiencing waxing and waning diarrhea since October 2023.  He reports half a dozen episodes of diarrhea prior to coming to the ED but when asked if this was new or worse than his baseline diarrhea his spouse reported that this was about his baseline may be slightly worse.  He has also had difficulty with his p.o. intake reporting nausea.  Wife reports he is inconsistent with drinking fluids regularly even though she attempts to remind him.  He was on his way to the bathroom when he felt his body became weak and legs gave out falling to the floor and losing consciousness briefly.  He denied blurred vision/seeing spots/headache.  When he came to he was very nauseous and began vomiting.  At this point his wife called 911 and EMS. EMS reported when they attempted to get the patient up to his walker he again lost consciousness and had an episode of vomiting and diarrhea. He endorsed intermittent cramping abdominal pain, denied seeing any blood in his vomit or stool. The patient denied any fever/chills, chest pain, shortness of breath/productive cough, urinary symptoms.  Patient has had 2 similar admissions this year, where he had diarrhea syncopized and was found to be hypotensive, in February 2024 & May 2024.  ED course: Upon arrival patient alert and responsive complaining of diffuse abdominal pain and nausea, continuing to have diarrhea on arrival.  Patient hypotensive requiring 2 L of acute oxygen support.  Blood pressure recovered initially and patient received sepsis protocol  including IV fluid resuscitation and empiric antibiotics.  Ultimately patient became hypotensive again requiring peripheral vasopressor support.  Labs significant for mild NAGMA and possible mild AKI on CKD with hyperglycemia, leukocytosis and lactic acidosis. Medications given: Cefepime, Flagyl vancomycin, 3 L IV fluid bolus, IV contrast, Zofran, potassium &  Initial Vitals: 98, 17, 65, 72/50 and 93% on 2 L nasal cannula Significant labs: (Labs/ Imaging personally reviewed) I, Cheryll Cockayne Rust-Chester, AGACNP-BC, personally viewed and interpreted this ECG. EKG Interpretation: Date: ***, EKG Time: ***, Rate: ***, Rhythm: ***, QRS Axis:  *** Intervals: ***, ST/T Wave abnormalities: ***, Narrative Interpretation: *** Chemistry: Na+:***, K+: ***, BUN/Cr.: ***, Serum CO2/ AG: *** Hematology: WBC: ***, Hgb: ***,  Troponin: ***, BNP: ***, Lactic/ PCT: ***, COVID-19 & Influenza A/B: *** ABG: *** CXR ***: *** CT ***: ***  PCCM consulted for admission due to ***.  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 rate 19, height 6\' 3"  (1.905 m), weight 99.8 kg, SpO2 93%.       No intake or output data in the 24 hours ending 12/12/22 1958 Filed Weights   12/12/22 1543  Weight: 99.8 kg    Examination: General: Adult ***, critically***acutely  ill, lying in bed intubated & sedated requiring mechanical ventilation *** NAD HEENT: MM pink/moist, anicteric***, atraumatic, neck supple Neuro: A&O x *** commands, PERRL *** , MAE CV: s1s2 ***RRR, *** on monitor, no r/m/g Pulm: Regular, non labored on *** ,  breath sounds ***-BUL & ***-BLL GI: soft, ***, non***tender, bs x 4 GU: foley in place *** with clear yellow urine Skin: *** no rashes/lesions noted Extremities: warm/dry, pulses + 2 R/P, *** edema noted  Resolved Hospital Problem list     Assessment & Plan:  ***  Best Practice (right click and "Reselect all SmartList Selections" daily)  Diet/type: {diet type:25684} DVT prophylaxis: {anticoagulation (Optional):25687} GI prophylaxis: {GN:56213} Lines: {Central Venous Access:25771} Foley:  {Central Venous Access:25691} Code Status:  {Code Status:26939} Last date of multidisciplinary goals of care discussion [***]  Labs   CBC: Recent Labs  Lab 12/12/22 1550  WBC 14.3*  NEUTROABS 8.7*  HGB 14.2  HCT 44.8  MCV 92.4  PLT 231    Basic Metabolic Panel: Recent Labs  Lab 12/12/22 1550  NA 138  K 3.5  CL 103  CO2 21*  GLUCOSE 201*  BUN 29*  CREATININE 1.66*  CALCIUM 9.1  MG 2.1   GFR: Estimated Creatinine Clearance: 39.6 mL/min (A) (by C-G formula based on SCr of 1.66 mg/dL (H)). Recent Labs  Lab 12/12/22 1550 12/12/22 1618 12/12/22 1804  WBC 14.3*  --   --   LATICACIDVEN  --  4.5* 3.3*    Liver Function Tests: Recent Labs  Lab 12/12/22 1550  AST 20  ALT 11  ALKPHOS 49  BILITOT 0.3  PROT 6.8  ALBUMIN 3.4*   Recent Labs  Lab 12/12/22 1550  LIPASE 49   No results for input(s): "AMMONIA" in the last 168 hours.  ABG No results found for: "PHART", "PCO2ART", "PO2ART", "HCO3", "TCO2", "ACIDBASEDEF", "O2SAT"   Coagulation Profile: No results for input(s): "INR", "PROTIME" in the last 168 hours.  Cardiac Enzymes: No results for input(s): "CKTOTAL", "CKMB", "CKMBINDEX", "TROPONINI" in the last 168 hours.  HbA1C: Hgb A1c MFr Bld  Date/Time Value Ref Range Status  01/26/2022 05:20 AM 6.0 (H) 4.8 - 5.6 % Final    Comment:    (NOTE) Pre diabetes:          5.7%-6.4%  Diabetes:              >6.4%  Glycemic control for   <7.0% adults with  diabetes   07/02/2016 01:33 AM 6.6 (H) 4.8 - 5.6 % Final    Comment:    (NOTE)         Pre-diabetes: 5.7 - 6.4         Diabetes: >6.4         Glycemic control for adults with diabetes: <7.0     CBG: No results for input(s): "GLUCAP" in the last 168 hours.  Review of Systems: Positives in BOLD***  Gen: Denies fever, chills, weight change, fatigue, night sweats HEENT: Denies blurred vision, double vision, hearing loss, tinnitus, sinus congestion, rhinorrhea, sore throat, neck stiffness, dysphagia PULM: Denies shortness of breath, cough, sputum production, hemoptysis, wheezing CV: Denies chest pain, edema, orthopnea, paroxysmal nocturnal dyspnea, palpitations GI: Denies abdominal pain, nausea, vomiting, diarrhea, hematochezia, melena, constipation, change in bowel habits GU: Denies dysuria, hematuria, polyuria, oliguria, urethral discharge Endocrine: Denies hot or cold intolerance, polyuria, polyphagia or appetite change Derm: Denies rash, dry skin, scaling or peeling skin change Heme: Denies easy bruising, bleeding, bleeding gums Neuro: Denies headache, numbness, weakness, slurred  speech, loss of memory or consciousness  Past Medical History:  He,  has a past medical history of Aneurysm (HCC), Cancer (HCC), Diabetes mellitus without complication (HCC), Hypertension, and Renal disorder.   Surgical History:   Past Surgical History:  Procedure Laterality Date  . CYSTOSCOPY WITH INSERTION OF UROLIFT N/A 02/22/2022   Procedure: CYSTOSCOPY WITH INSERTION OF UROLIFT;  Surgeon: Sondra Come, MD;  Location: [facility] ORS;  Service: Urology;  Laterality: N/A;  . HERNIA REPAIR    . JOINT REPLACEMENT Bilateral    hip replaced and Left knee  . TOTAL HIP REVISION       Social History:   reports that he has been smoking cigars. He has been exposed to tobacco smoke. He has never used smokeless tobacco. He reports that he does not use drugs.   Family History:  His family history includes  Diabetes in his brother and mother; Heart disease in his father; Hyperlipidemia in his mother; Hypertension in his father and mother.   Allergies Allergies  Allergen Reactions  . Metformin Diarrhea  . Sulfa Antibiotics Nausea And Vomiting and Other (See Comments)    Patient states not allergic  . Varicella Virus Vaccine Live Other (See Comments)    Cramps and weakness  . Zoster Vac Recomb Adjuvanted     Other reaction(s): Not available  . Zoster Vaccine Recombinant, Adjuvanted     Other Reaction(s): Unknown  Other reaction(s): Not available     Home Medications  Prior to Admission medications   Medication Sig Start Date End Date Taking? Authorizing Provider  acetaminophen (TYLENOL) 650 MG CR tablet Take 650 mg by mouth every 8 (eight) hours as needed.   Yes [provider]  CREON 36000-114000 units CPEP capsule Take 36,000 Units by mouth 3 (three) times daily. 06/14/22  Yes [provider]  cyanocobalamin (VITAMIN B12) 1000 MCG tablet Take by mouth.   Yes [provider]  Dulaglutide 3 MG/0.5ML SOPN Inject into the skin once a week. 11/06/21  Yes [provider]  finasteride (PROSCAR) 5 MG tablet Take 1 tablet (5 mg total) by mouth daily. 07/04/22  Yes Sondra Come, MD  furosemide (LASIX) 20 MG tablet Take 20 mg by mouth daily.   Yes [provider]  hydrochlorothiazide (HYDRODIURIL) 25 MG tablet Take 25 mg by mouth daily.   Yes [provider]  hydrocortisone 2.5 % cream Apply topically 2 (two) times daily. 10/11/22 10/11/23 Yes [provider]  lamoTRIgine (LAMICTAL) 25 MG tablet Take 25 mg by mouth. 07/01/22 12/12/22 Yes [provider]  lisinopril (PRINIVIL,ZESTRIL) 20 MG tablet Take 20 mg by mouth daily.   Yes [provider]  Multiple Vitamin (MULTIVITAMIN) tablet Take 1 tablet by mouth daily.   Yes [provider]  mupirocin ointment (BACTROBAN) 2 % Apply 1 Application topically 3 (three) times  daily. 11/25/22  Yes [provider]  ondansetron (ZOFRAN-ODT) 4 MG disintegrating tablet Take 1 tablet (4 mg total) by mouth every 8 (eight) hours as needed. 09/12/22  Yes Delton Prairie, MD  rosuvastatin (CRESTOR) 20 MG tablet Take 20 mg by mouth daily.   Yes [provider]  tamsulosin (FLOMAX) 0.4 MG CAPS capsule Take 1 capsule (0.4 mg total) by mouth daily after supper. 07/04/22  Yes Sondra Come, MD  fluorouracil (EFUDEX) 5 % cream Apply to affected areas twice a day for three weeks. Expect redness and irritation. Patient not taking: Reported on 12/12/2022 10/11/22   [provider]  glimepiride (  AMARYL) 4 MG tablet Take 4 mg by mouth 2 (two) times daily. Patient not taking: Reported on 10/25/2022 05/27/17 09/27/22  [provider]     Critical care time: ***       Cheryll Cockayne Rust-Chester, AGACNP-BC Acute Care Nurse Practitioner [facility]   [phone] / [phone] Please see Amion for details.

## 2022-12-12 NOTE — Progress Notes (Signed)
eLink Physician-Brief Progress Note Patient Name: PANAYOTIS DECICCO DOB: 25-Dec-1940 MRN: [id number]   Date of Service  12/12/2022  HPI/Events of Note  82 year old with a history of hypertension, diabetes, and CKD who initially presented to the emergency department for syncope complicated by fall  On presentation he has tachycardic, minimally tachypneic, saturating 100% on room air, hypotensive requiring initiation of norepinephrine.  Treated with broad-spectrum antibiotics-vancomycin, cefepime, and metronidazole.  Comfortable appearing on exam.  Metabolic panel with elevated creatinine and mild hyperglycemia.  Minimal lactate elevation and leukocytosis present.  Blood cultures obtained.  CT head without contrast shows expansion of known ICA aneurysm with potential MCA involvement.  CT chest abdomen pelvis consistent with right-sided hydroureteronephrosis.  eICU Interventions  Maintain norepinephrine for MAP goal greater than 65  Status post 3 L crystalloid infusion, maintain continuous IVF  DVT prophylaxis with enoxaparin GI prophylaxis not currently indicated     Intervention Category Evaluation Type: New Patient Evaluation  Caylan Chenard 12/12/2022, 11:04 PM

## 2022-12-12 NOTE — Consult Note (Signed)
Cardiology Consultation   Patient ID: Scott Buckley MRN: [id number]; DOB: [birth year]  Admit date: 12/12/2022 Date of Consult: 12/12/2022  PCP:  Scott Corner, PA-C   Scott Buckley Cardiologist:  None      New consult completed by Scott Buckley  Patient Profile:   Scott Buckley is a 82 y.o. 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  History of Present Illness:   Scott Buckley was recently admitted to [facility] on 05/20/2019 for with acute onset of syncope and abdominal pain.  He had a presyncopal episode after having 2 loose bowel movements with associated abdominal pain.  He later had a syncopal episode with EMS and was noted to be hypotensive.  They had attempted to go back to the kitchen with his walker but felt like he was going to pass out and let us of down to the floor.  States he did not lose consciousness at that point.  When EMS arrived he felt nauseated but did not vomit.  He was having mild abdominal discomfort in the emergency department with abdominal distention compared to baseline.  He was admitted for further evaluation.  Labs were significant for serum creatinine 1.53, lipase 56, lactic acid 3.2, WBC 17.3, COVID, influenza, RSV negative.  He was started on IV fluids and started on IV Rocephin for sepsis suspected sepsis.  Syncope workup was negative thus far.  Echocardiogram revealed LVEF of 60 to 65%.  Carotid duplex showed less than 50% stenosis.  His labs are improved.  PT and OT recommended home health services.  He was treated for sepsis due to undetermined organism, vasovagal syncope thought to be neurogenic mediated syncope and possibly due to orthostatic hypotension which required IV fluid  resuscitation, AKI on CKD, type 2 diabetes, hypertension with dyslipidemia.  Patient was able to be discharged home on 05/21/2022.  Was admitted to [facility] 5/4 - 5/9 for syncope, hypotension, gastroenteritis and possible sepsis.  Strep.  And some blood culture and discharged with a PICC line with ceftriaxone x 14 days.  Was again reevaluated in the emergency department 09/11/2022 brought in via medic for nausea that had resolved with vomiting and diarrhea.  He reported that he had to have something that he had not eaten.  Denied any chest pain shortness of breath fever or chills.  He received a liter of LR and oral fluids.  Urine without infectious features considering lack of urinary symptoms.  CT obtained without acute features and he did not have any recurrent episodes of diarrhea or vomiting in the emergency department.  He was tolerating oral fluids and suitable for outpatient management.  There was no clear indication to initiate antibiotics.  Suspect most likely foodborne related illness and he was discharged home.  He presented to the [facility] emergency department via EMS status post mechanical fall.  EMS states patient hit his head per the wife.  EMS stated the patient was covered in vomit upon arrival.  While EMS was attempting to get the patient off of the floor patient had period of unresponsiveness accompanied by defecation.  EMS states patient had a color of gray and became diaphoretic.  On arrival to the emergency department he continued to complain of some pain in his abdomen diffusely and  continues to feel nauseous actively having diarrhea on arrival.  Denies any pain in his chest and not had fever, cough, difficulty breathing.  Denies any headache or neck pain.  And is not currently on any blood thinners.  Initial vitals: Blood pressure 60/49, pulse 65, respirations 15, temperature 98  Pertinent labs: WBCs 14.3, CO2 21, blood glucose 201, BUN 29, serum creatinine 1.66, albumin 3.4, lipase of 49,  high-sensitivity troponin negative, lactic acid of 4.5  Imaging pending  Medications administered in the emergency department: 1 L of lactated Ringer's and 4 mg of Zofran  Cardiology was consulted for concern of abnormal EKG per patient had previously had sinus rhythm with first-degree AV block and chronic right bundle branch block and converted to a slow idioventricular rhythm with a left bundle branch block     Past Medical History:  Diagnosis Date   Aneurysm (HCC)    Cancer (HCC)    Diabetes mellitus without complication (HCC)    Hypertension    Renal disorder     Past Surgical History:  Procedure Laterality Date   CYSTOSCOPY WITH INSERTION OF UROLIFT N/A 02/22/2022   Procedure: CYSTOSCOPY WITH INSERTION OF UROLIFT;  Surgeon: Scott Come, MD;  Location: [facility] ORS;  Service: Urology;  Laterality: N/A;   HERNIA REPAIR     JOINT REPLACEMENT Bilateral    hip replaced and Left knee   TOTAL HIP REVISION       Home Medications:  Prior to Admission medications   Medication Sig Start Date End Date Taking? Authorizing Provider  acetaminophen (TYLENOL) 650 MG CR tablet Take 650 mg by mouth every 8 (eight) hours as needed.    [provider]  CREON 36000-114000 units CPEP capsule Take 36,000 Units by mouth 3 (three) times daily. 06/14/22   [provider]  cyanocobalamin (VITAMIN B12) 1000 MCG tablet Take by mouth.    [provider]  Dulaglutide 3 MG/0.5ML SOPN Inject into the skin once a week. 11/06/21   [provider]  finasteride (PROSCAR) 5 MG tablet Take 1 tablet (5 mg total) by mouth daily. 07/04/22   Scott Come, MD  furosemide (LASIX) 20 MG tablet Take 20 mg by mouth daily.    [provider]  glimepiride (AMARYL) 4 MG tablet Take 4 mg by mouth 2 (two) times daily. Patient not taking: Reported on 10/25/2022 05/27/17 09/27/22  [provider]  hydrochlorothiazide (HYDRODIURIL) 25 MG tablet Take 25 mg by mouth daily.     [provider]  lamoTRIgine (LAMICTAL) 25 MG tablet Take 25 mg by mouth. 07/01/22 09/27/22  [provider]  Lancets (ONETOUCH DELICA PLUS LANCET30G) MISC Checks 1-2 times a week 03/09/20   [provider]  lisinopril (PRINIVIL,ZESTRIL) 20 MG tablet Take 20 mg by mouth daily.    [provider]  Multiple Vitamin (MULTIVITAMIN) tablet Take 1 tablet by mouth daily.    [provider]  ondansetron (ZOFRAN-ODT) 4 MG disintegrating tablet Take 4 mg by mouth every 8 (eight) hours as needed. 06/24/22   [provider]  ondansetron (ZOFRAN-ODT) 4 MG disintegrating tablet Take 1 tablet (4 mg total) by mouth every 8 (eight) hours as needed. 09/12/22   Delton Prairie, MD  rosuvastatin (CRESTOR) 20 MG tablet Take 20 mg by mouth daily.    [provider]  tamsulosin (FLOMAX) 0.4 MG CAPS capsule Take 1 capsule (0.4 mg total) by mouth daily after supper. 07/04/22   Scott Come, MD    Inpatient Medications:  Scheduled Meds:  Continuous Infusions:  ceFEPime (MAXIPIME) IV     lactated ringers     metronidazole     sodium chloride     vancomycin     PRN Meds:   Allergies:    Allergies  Allergen Reactions   Metformin Diarrhea   Sulfa Antibiotics Nausea And Vomiting and Other (See Comments)    Patient states not allergic   Varicella Virus Vaccine Live Other (See Comments)    Cramps and weakness   Zoster Vac Recomb Adjuvanted     Other reaction(s): Not available   Zoster Vaccine Recombinant, Adjuvanted     Other Reaction(s): Unknown  Other reaction(s): Not available    Social History:   Social History   Socioeconomic History   Marital status: Married    Spouse name: Not on file   Number of children: Not on file   Years of education: Not on file   Highest education level: Not on file  Occupational History   Not on file  Tobacco Use   Smoking status: Every Day    Types: Cigars    Passive exposure: Current (chews cigars, but it  has been weeks since last time)   Smokeless tobacco: Never   Tobacco comments:    1 cigar per day  Vaping Use   Vaping status: Never Used  Substance and Sexual Activity   Alcohol use: Not on file   Drug use: No   Sexual activity: Not Currently  Other Topics Concern   Not on file  Social History Narrative   Not on file   Social Determinants of Health   Financial Resource Strain: Low Risk  (08/12/2022)   Received from [facility], [facility]   Overall Financial Resource Strain (CARDIA)    Difficulty of Paying Living Expenses: Not very hard  Food Insecurity: No Food Insecurity (08/12/2022)   Received from [facility], [facility]   Hunger Vital Sign    Worried About Running Out of Food in the Last Year: Never true    Ran Out of Food in the Last Year: Never true  Transportation Needs: No Transportation Needs (08/12/2022)   Received from [facility], [facility]   PRAPARE - Transportation    Lack of Transportation (Medical): No    Lack of Transportation (Non-Medical): No  Physical Activity: Not on file  Stress: Not on file  Social Connections: Not on file  Intimate Partner Violence: Not At Risk (05/20/2022)   Humiliation, Afraid, Rape, and Kick questionnaire    Fear of Current or Ex-Partner: No    Emotionally Abused: No    Physically Abused: No    Sexually Abused: No    Family History:    Family History  Problem Relation Age of Onset   Hyperlipidemia Mother    Hypertension Mother    Diabetes Mother    Hypertension Father    Heart disease Father    Diabetes Brother      ROS:  Please see the history of present illness.  Review of Systems  Constitutional:  Positive for malaise/fatigue.  Gastrointestinal:  Positive for abdominal pain, diarrhea, nausea and vomiting.  Genitourinary:  Positive for frequency and urgency.  Musculoskeletal:  Positive for falls.  Neurological:  Positive for dizziness, tremors, loss of consciousness and weakness.    All  other ROS reviewed and negative.     Physical Exam/Data:   Vitals:   12/12/22 1625 12/12/22 1630 12/12/22 1635 12/12/22 1640  BP: Marland Kitchen)  85/62 97/61    Pulse: (!) 56 74 64 81  Resp: 15 14 20 15   Temp:      TempSrc:      SpO2: 99% 100% 98% 99%  Weight:      Height:       No intake or output data in the 24 hours ending 12/12/22 1743    12/12/2022    3:43 PM 10/25/2022    8:52 AM 09/27/2022    1:17 PM  Last 3 Weights  Weight (lbs) 220 lb 214 lb 220 lb 8 oz  Weight (kg) 99.791 kg 97.07 kg 100.018 kg     Body mass index is 27.5 kg/m.  General: Frail, chronically ill-appearing, in no acute distress HEENT: normal Neck: no JVD Vascular: No carotid bruits; Distal pulses 2+ bilaterally Cardiac:  normal S1, S2; RRR; no murmur  Lungs:  clear uppers with diminished bases to auscultation bilaterally, no wheezing, rhonchi or rales, rations are unlabored at rest on 2 L of O2 via nasal cannula Abd: soft, mildly tender, no hepatomegaly  Ext: Trace pretibial edema Musculoskeletal:  No deformities, BUE and BLE strength normal and equal Skin: warm and dry  Neuro:  CNs 2-12 intact, no focal abnormalities noted Psych:  Normal affect   EKG:  The EKG was personally reviewed and demonstrates: EKG #1 reveals sinus rhythm with a first-degree AV block and chronic right bundle branch block with a rate of 60; EKG #2 shows idioventricular rhythm with a left bundle branch block, patient spontaneously converted back to initial rhythm Telemetry:  Telemetry was personally reviewed and demonstrates: Sinus bradycardia to sinus rhythm with rates of 50-80 with right bundle branch block and a first-degree AV block  Relevant CV Studies: TTE completed at outside facility 08/14/2022 [facility]) Summary   1. The left ventricle is normal in size with mildly increased wall  thickness.   2. The left ventricular systolic function is normal, LVEF is visually  estimated at 55%.    3. The aortic valve is probably trileaflet with  mildly thickened leaflets  with normal excursion.    4. There is mild aortic regurgitation.    5. The right ventricle is normal in size, with normal systolic function.    6. There are no apparent valvular vegetations on suboptimal images.    7. Technically difficult study.    8. An ultrasound enhancing agent was used to improve the visualization of  the left ventricular cavity and endocardial borders   TTE 05/20/22 1. Left ventricular ejection fraction, by estimation, is 60 to 65%. The  left ventricle has normal function. The left ventricle has no regional  wall motion abnormalities. There is mild left ventricular hypertrophy.  Left ventricular diastolic parameters  are consistent with Grade I diastolic dysfunction (impaired relaxation).   2. Right ventricular systolic function is normal. The right ventricular  size is normal. There is normal pulmonary artery systolic pressure.   3. Left atrial size was mildly dilated.   4. Right atrial size was mildly dilated.   5. The mitral valve is normal in structure. Mild mitral valve  regurgitation. No evidence of mitral stenosis.   6. The aortic valve is normal in structure. Aortic valve regurgitation is  mild. Aortic valve sclerosis/calcification is present, without any  evidence of aortic stenosis.   7. Aortic dilatation noted. There is mild dilatation of the aortic root,  measuring 42 mm.   8. The inferior vena cava is dilated in size with >50% respiratory  variability, suggesting  right atrial pressure of 8 mmHg.    Laboratory Data:  High Sensitivity Troponin:   Recent Labs  Lab 12/12/22 1550  TROPONINIHS 6     Chemistry Recent Labs  Lab 12/12/22 1550  NA 138  K 3.5  CL 103  CO2 21*  GLUCOSE 201*  BUN 29*  CREATININE 1.66*  CALCIUM 9.1  MG 2.1  GFRNONAA 40*  ANIONGAP 14    Recent Labs  Lab 12/12/22 1550  PROT 6.8  ALBUMIN 3.4*  AST 20  ALT 11  ALKPHOS 49  BILITOT 0.3   Lipids No results for input(s): "CHOL",  "TRIG", "HDL", "LABVLDL", "LDLCALC", "CHOLHDL" in the last 168 hours.  Hematology Recent Labs  Lab 12/12/22 1550  WBC 14.3*  RBC 4.85  HGB 14.2  HCT 44.8  MCV 92.4  MCH 29.3  MCHC 31.7  RDW 14.0  PLT 231   Thyroid No results for input(s): "TSH", "FREET4" in the last 168 hours.  BNPNo results for input(s): "BNP", "PROBNP" in the last 168 hours.  DDimer No results for input(s): "DDIMER" in the last 168 hours.   Radiology/Studies:  No results found.   Assessment and Plan:   Syncope with collapse/abnormal EKG -Patient with loss of consciousness, per wife stating head was hit -Imaging is pending -Per chart review patient has had several hospitalizations related to syncope -Previous workups have all been unrevealing -Echocardiogram shows normal LVEF, carotid duplex with insignificant stenosis noted -Abnormal EKG during evaluation in the emergency department where patient remained asymptomatic patient went from sinus rhythm with first-degree AV block and chronic right bundle branch block to idioventricular rhythm with left bundle, electrical disruption likely from sepsis -Currently no indication for transvenous pacing or PPM implantation with active infection -Can consider transvenous pacing if patient becomes unstable -If continues to remain hypotensive and bradycardic consider vasopressors -If no further episodes of rhythm changes noted recommend ZIO on discharge  Sepsis of unknown origin -Patient presented with syncope status post mechanical fall with nausea vomiting and diarrhea -Lactic acid 4.5 -Started on sepsis protocol in the emergency department -IV hydration and IV antibiotics -Continue management per primary team -Likely culprit of abnormal EKG  Hypotension with a history of hypertension -Pressure on arrival 60/49 -Given IV fluid boluses -PTA antihypertensive medications remain on hold -Continue close monitoring of blood pressure -Recommend continuing IV fluid  and pressors if needed  CKD stage IIIa -Serum creatinine 1.66 -Baseline serum creatinine 1.3-1.5 -Monitor urine output -Monitor/trend/replete electrolytes as needed -Daily BMP -Avoid nephrotoxic agents were able  Type 2 diabetes -Management per primary team  6.   Peripheral arterial disease -Status post iliac artery aneurysm -Duplex from July reveals stable aortic diameter of approximately 4 cm with stent graft being widely patent and no apparent endoleak -Recommend annual follow-up with duplex -Continues to follow with vascular  7.   Intracranial aneurysm -Imaging pending -Follows with neurosurgery as outpatient   Risk Assessment/Risk Scores:                For questions or updates, please contact [facility] Please consult [email or web address] for contact info under    Signed, Frankee Gritz, NP  12/12/2022 5:43 PM

## 2022-12-12 NOTE — Progress Notes (Signed)
PHARMACY -  BRIEF ANTIBIOTIC NOTE   Pharmacy has received consult(s) for Cefepime and Vancomycin  from an ED provider.  The patient's profile has been reviewed for ht/wt/allergies/indication/available labs.    One time order(s) placed for Vancomycin 2g and Cefepime 2g  Further antibiotics/pharmacy consults should be ordered by admitting physician if indicated.                       Thank you, Gardner Candle, PharmD, BCPS Clinical Pharmacist 12/12/2022 5:28 PM

## 2022-12-12 NOTE — ED Notes (Signed)
Patient clammy, but pink in color

## 2022-12-12 NOTE — H&P (Addendum)
NAME:  Scott Buckley, MRN:  [id number], DOB:  1940/05/10, LOS: 0 ADMISSION DATE:  12/12/2022, CONSULTATION DATE:  12/12/22 REFERRING MD:  Dr. Larinda Buttery, CHIEF COMPLAINT:  Syncope and collapse   History of Present Illness:  82 year old male presenting to [facility] ED from home via EMS on 12/12/2022 for evaluation of syncope and collapse.  History provided per chart review & bedside interview with patient and spouse. Patient has been experiencing waxing and waning diarrhea since October 2023.  He reports half a dozen episodes of diarrhea prior to coming to the ED but when asked if this was new or worse than his baseline diarrhea his spouse reported that this was about his baseline may be slightly worse.  He has also had difficulty with his p.o. intake reporting nausea.  Wife reports he is inconsistent with drinking fluids regularly even though she attempts to remind him.  He was on his way to the bathroom when he felt his body became weak and legs gave out falling to the floor and losing consciousness briefly.  He denied blurred vision/seeing spots/headache.  When he came to he was very nauseous and began vomiting.  At this point his wife called 911 and EMS. EMS reported when they attempted to get the patient up to his walker he again lost consciousness and had an episode of vomiting and diarrhea. He endorsed intermittent cramping abdominal pain, denied seeing any blood in his vomit or stool. The patient denied any fever/chills, chest pain, shortness of breath/productive cough, urinary symptoms.  Patient has had 2 similar admissions this year, where he had diarrhea syncopized and was found to be hypotensive, in February 2024 & May 2024.  ED course: Upon arrival patient alert and responsive complaining of diffuse abdominal pain and nausea, continuing to have diarrhea on arrival.  Patient hypotensive requiring 2 L of acute oxygen support.  Blood pressure recovered initially and patient received sepsis protocol  including IV fluid resuscitation and empiric antibiotics.  Patient also had a brief change in rhythm to the ventricular tachycardia which was self sustained.  Cardiology consulted in ED with recommendations to monitor closely.  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  EDP spoke with neurosurgery on-call who will follow up with the patient while admitted, no urgent recommendations overnight. Ultimately patient became hypotensive again requiring peripheral vasopressor support.  Labs significant for mild NAGMA and possible mild AKI on CKD with hyperglycemia, leukocytosis and lactic acidosis. Medications given: Cefepime, Flagyl vancomycin, 3 L IV fluid bolus, IV contrast, Zofran, potassium & Levophed drip started Initial Vitals: 98, 17, 65, 72/50 and 93% on 2 L nasal cannula Significant labs: (Labs/ Imaging personally reviewed) I, Cheryll Cockayne Rust-Chester, AGACNP-BC, personally viewed and interpreted this ECG. EKG Interpretation: Date: 12/12/2022, EKG Time: 15:43, Rate: 60, Rhythm: NSR, QRS Axis: normal, Intervals: RBBB & 1st degree HB, ST/T Wave abnormalities: none, Narrative Interpretation: NSR with chronic RBBB & 1st degree HB 2nd EKG shows an idioventricular rhythm with a LBBB which then spontaneously converted back to NSR with RBBB & 1st degree Chemistry: Na+: 138, K+: 3.5, BUN/Cr.:  29/1.66, Serum CO2/ AG: 21/14 Hematology: WBC: 14.3, Hgb: 14.2,  Troponin: 6 > 6, Lactic/ PCT: 4.5 > 3.3/pending  CT head without contrast 12/12/2022: No acute intracranial abnormality, large 3.1 x 3.1 x 3.0 cm aneurysm likely arising from left ICA terminus this has increased in size compared to 2019 at which time it measured  2.5 x 2.0 x 2.1 cm. CT cervical spine without contrast 12/12/2022: Dependent airspace disease suggesting aspiration.  No definitive signs of trauma.  Peritoneal fluid around the spleen and in the  pelvis but low-density and without visible visceral injury.  Suggest abdominal exam follow-up.  Right hydroureteronephrosis without visible cause.  Symmetric renal enhancement and excretion CT chest/abdomen/pelvis with contrast 12/12/2022: No acute fracture or traumatic subluxation of the cervical spine  PCCM consulted for admission due to suspected hypovolemic shock +/- sepsis with suspected aspiration pneumonia requiring peripheral vasopressor support.  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  Interim History / Subjective:  Patient alert and oriented with spouse bedside on 5 mcgs peripheral Levophed.  Bedside assessment patient in sinus tach, will order additional small IVF bolus followed by continuous IVF.  Plan of care discussed, all questions and concerns answered at this time  Objective   Blood pressure 113/75, pulse (!) 116, temperature 97.8 F (36.6 C), temperature source Oral, resp. rate 19, height 6\' 3"  (1.905 m), weight 99.8 kg, SpO2 93%.       No intake or output data in the 24 hours ending 12/12/22 1958 Filed Weights   12/12/22 1543  Weight: 99.8 kg    Examination: General: Adult male, critically ill, lying in bed, NAD HEENT: MM pale & dry, anicteric, atraumatic, neck supple Neuro: A&O x 4, able to follow commands, PERRL +2, MAE CV: s1s2 RRR, ST on monitor, no r/m/g Pulm: Regular, non labored on 2 L nasal cannula, breath sounds clear-BUL & diminished-BLL GI: soft, rounded, mildly tender, bs x 4 Skin: Scattered ecchymosis.  Excoriated areas on bilateral buttocks with DTI  Extremities: warm/dry,  pulses + 2 R/P, trace edema noted BLE  Resolved Hospital Problem list     Assessment & Plan:  Syncope and collapse suspect secondary to hypovolemic shock Hypovolemic shock +/- sepsis suspect secondary to dehydration from chronic diarrhea/vomiting and poor p.o. intake Wide-complex rhythm with left bundle branch block PMHx: HTN Similar presentations in February and May 2024.  Echocardiogram done in February 2024: Showing mild LVH, G1 DD and mildly dilated bilateral atria, with mild MR and mild dilatation of the aortic root. -Additional 250 mL bolus ordered followed by LR @ 100 mL/h - Continue vasopressors to maintain MAP< 65: norepinephrine -Outpatient lisinopril, Lasix and hydrochlorothiazide on hold at this time - continuous cardiac montioring - cardiology following, appreciate input  Suspected sepsis  Aspiration pneumonia Chronic persistent diarrhea/gastroenteritis Initial interventions/workup included: 3 L of NS/LR & Cefepime/ Vancomycin/ Metronidazole.  C. 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, and residual urinary retention/incomplete bladder emptying  Chronic right hydronephrosis Baseline Cr: 1.2 (February 2024), most recent Cr 1.3 (June 2024) on admission: 1.66 - Strict I/O's: alert provider if UOP < 0.5 mL/kg/hr - gentle IVF hydration  - Daily BMP, replace electrolytes PRN -Continue outpatient Finasteride,  hold Flomax at this time due to syncope > consider restarting as patient stabilizes -Bladder scan every 6 h, monitor PVR, I & O catheterization was required > consider Foley catheter insertion if needed - Avoid nephrotoxic  agents as able, ensure adequate renal perfusion  Enlarging cerebral aneurysm PMHx: Iliac artery aneurysm s/p endovascular repair EDP spoke with neurosurgery on-call will follow-up with patient in a.m. does not recommend CTA or any other intervention at this time. -Neurosurgery consulted, appreciate input -Every 4 neurochecks  Hyperlipidemia - continue outpatient Crestor  Parkinson's disease - continue outpatient Lamictal   Type 2 Diabetes Mellitus Hemoglobin A1C: pending - Monitor CBG Q 4 hours - SSI moderate dosing - target range while in ICU: 140-180 - follow ICU hyper/hypo-glycemia protocol  Excoriation & DTI on bilateral buttocks - woc consulted, appreciate input - Q 2 turn - utilize offloading devices  Best Practice (right click and "Reselect all SmartList Selections" daily)  Diet/type: NPO w/ oral meds DVT prophylaxis: LMWH GI prophylaxis: PPI Lines: N/A Foley:  N/A Code Status:  full code Last date of multidisciplinary goals of care discussion [12/12/22]  Labs   CBC: Recent Labs  Lab 12/12/22 1550  WBC 14.3*  NEUTROABS 8.7*  HGB 14.2  HCT 44.8  MCV 92.4  PLT 231    Basic Metabolic Panel: Recent Labs  Lab 12/12/22 1550  NA 138  K 3.5  CL 103  CO2 21*  GLUCOSE 201*  BUN 29*  CREATININE 1.66*  CALCIUM 9.1  MG 2.1   GFR: Estimated Creatinine Clearance: 39.6 mL/min (A) (by C-G formula based on SCr of 1.66 mg/dL (H)). Recent Labs  Lab 12/12/22 1550 12/12/22 1618 12/12/22 1804  WBC 14.3*  --   --   LATICACIDVEN  --  4.5* 3.3*    Liver Function Tests: Recent Labs  Lab 12/12/22 1550  AST 20  ALT 11  ALKPHOS 49  BILITOT 0.3  PROT 6.8  ALBUMIN 3.4*   Recent Labs  Lab 12/12/22 1550  LIPASE 49   No results for input(s): "AMMONIA" in the last 168 hours.  ABG No results found for: "PHART", "PCO2ART", "PO2ART", "HCO3", "TCO2", "ACIDBASEDEF", "O2SAT"   Coagulation Profile: No results for input(s): "INR", "PROTIME" in the last 168  hours.  Cardiac Enzymes: No results for input(s): "CKTOTAL", "CKMB", "CKMBINDEX", "TROPONINI" in the last 168 hours.  HbA1C: Hgb A1c MFr Bld  Date/Time Value Ref Range Status  01/26/2022 05:20 AM 6.0 (H) 4.8 - 5.6 % Final    Comment:    (NOTE) Pre diabetes:          5.7%-6.4%  Diabetes:              >6.4%  Glycemic control for   <7.0% adults with diabetes   07/02/2016 01:33 AM 6.6 (H) 4.8 - 5.6 % Final    Comment:    (NOTE)         Pre-diabetes: 5.7 - 6.4         Diabetes: >6.4         Glycemic control for adults with diabetes: <7.0     CBG: No results for input(s): "GLUCAP" in the last 168 hours.  Review of Systems: Positives in BOLD  Gen: Denies fever, chills, weight change, fatigue, night sweats HEENT: Denies blurred vision, double vision, hearing loss, tinnitus, sinus congestion, rhinorrhea, sore throat, neck stiffness, dysphagia PULM: Denies shortness of breath, cough, sputum production, hemoptysis, wheezing CV: Denies chest pain, edema, orthopnea, paroxysmal nocturnal dyspnea, palpitations GI: Denies abdominal  pain, nausea, vomiting, diarrhea, hematochezia, melena, constipation, change in bowel habits GU: Denies dysuria, hematuria, polyuria, oliguria, urethral discharge Endocrine: Denies hot or cold intolerance, polyuria, polyphagia or appetite change Derm: Denies rash, dry skin, scaling or peeling skin change Heme: Denies easy bruising, bleeding, bleeding gums Neuro: Denies headache, numbness, weakness, slurred speech, loss of memory or consciousness  Past Medical History:  He,  has a past medical history of Aneurysm (HCC), Cancer (HCC), Diabetes mellitus without complication (HCC), Hypertension, and Renal disorder.   Surgical History:   Past Surgical History:  Procedure Laterality Date   CYSTOSCOPY WITH INSERTION OF UROLIFT N/A 02/22/2022   Procedure: CYSTOSCOPY WITH INSERTION OF UROLIFT;  Surgeon: Sondra Come, MD;  Location: [facility] ORS;  Service: Urology;   Laterality: N/A;   HERNIA REPAIR     JOINT REPLACEMENT Bilateral    hip replaced and Left knee   TOTAL HIP REVISION       Social History:   reports that he has been smoking cigars. He has been exposed to tobacco smoke. He has never used smokeless tobacco. He reports that he does not use drugs.   Family History:  His family history includes Diabetes in his brother and mother; Heart disease in his father; Hyperlipidemia in his mother; Hypertension in his father and mother.   Allergies Allergies  Allergen Reactions   Metformin Diarrhea   Sulfa Antibiotics Nausea And Vomiting and Other (See Comments)    Patient states not allergic   Varicella Virus Vaccine Live Other (See Comments)    Cramps and weakness   Zoster Vac Recomb Adjuvanted     Other reaction(s): Not available   Zoster Vaccine Recombinant, Adjuvanted     Other Reaction(s): Unknown  Other reaction(s): Not available     Home Medications  Prior to Admission medications   Medication Sig Start Date End Date Taking? Authorizing Provider  acetaminophen (TYLENOL) 650 MG CR tablet Take 650 mg by mouth every 8 (eight) hours as needed.   Yes [provider]  CREON 36000-114000 units CPEP capsule Take 36,000 Units by mouth 3 (three) times daily. 06/14/22  Yes [provider]  cyanocobalamin (VITAMIN B12) 1000 MCG tablet Take by mouth.   Yes [provider]  Dulaglutide 3 MG/0.5ML SOPN Inject into the skin once a week. 11/06/21  Yes [provider]  finasteride (PROSCAR) 5 MG tablet Take 1 tablet (5 mg total) by mouth daily. 07/04/22  Yes Sondra Come, MD  furosemide (LASIX) 20 MG tablet Take 20 mg by mouth daily.   Yes [provider]  hydrochlorothiazide (HYDRODIURIL) 25 MG tablet Take 25 mg by mouth daily.   Yes [provider]  hydrocortisone 2.5 % cream Apply topically 2 (two) times daily. 10/11/22 10/11/23 Yes [provider]  lamoTRIgine (LAMICTAL) 25 MG tablet Take  25 mg by mouth. 07/01/22 12/12/22 Yes [provider]  lisinopril (PRINIVIL,ZESTRIL) 20 MG tablet Take 20 mg by mouth daily.   Yes [provider]  Multiple Vitamin (MULTIVITAMIN) tablet Take 1 tablet by mouth daily.   Yes [provider]  mupirocin ointment (BACTROBAN) 2 % Apply 1 Application topically 3 (three) times daily. 11/25/22  Yes [provider]  ondansetron (ZOFRAN-ODT) 4 MG disintegrating tablet Take 1 tablet (4 mg total) by mouth every 8 (eight) hours as needed. 09/12/22  Yes Delton Prairie, MD  rosuvastatin (CRESTOR) 20 MG tablet Take 20 mg by mouth daily.   Yes [provider]  tamsulosin (FLOMAX) 0.4 MG CAPS  capsule Take 1 capsule (0.4 mg total) by mouth daily after supper. 07/04/22  Yes Sondra Come, MD  fluorouracil (EFUDEX) 5 % cream Apply to affected areas twice a day for three weeks. Expect redness and irritation. Patient not taking: Reported on 12/12/2022 10/11/22   [provider]  glimepiride (AMARYL) 4 MG tablet Take 4 mg by mouth 2 (two) times daily. Patient not taking: Reported on 10/25/2022 05/27/17 09/27/22  [provider]     Critical care time: 69 minutes       Betsey Holiday, AGACNP-BC Acute Care Nurse Practitioner [facility]   [phone] / [phone] Please see Amion for details.

## 2022-12-13 DIAGNOSIS — R55 Syncope and collapse: Secondary | ICD-10-CM | POA: Diagnosis not present

## 2022-12-13 DIAGNOSIS — Z515 Encounter for palliative care: Secondary | ICD-10-CM

## 2022-12-13 DIAGNOSIS — T17908A Unspecified foreign body in respiratory tract, part unspecified causing other injury, initial encounter: Secondary | ICD-10-CM | POA: Diagnosis not present

## 2022-12-13 DIAGNOSIS — J9601 Acute respiratory failure with hypoxia: Secondary | ICD-10-CM | POA: Diagnosis not present

## 2022-12-13 DIAGNOSIS — R571 Hypovolemic shock: Secondary | ICD-10-CM | POA: Insufficient documentation

## 2022-12-13 DIAGNOSIS — A419 Sepsis, unspecified organism: Secondary | ICD-10-CM | POA: Diagnosis not present

## 2022-12-13 DIAGNOSIS — J189 Pneumonia, unspecified organism: Secondary | ICD-10-CM

## 2022-12-13 DIAGNOSIS — R579 Shock, unspecified: Secondary | ICD-10-CM

## 2022-12-13 LAB — GLUCOSE, CAPILLARY
Glucose-Capillary: 128 mg/dL — ABNORMAL HIGH (ref 70–99)
Glucose-Capillary: 133 mg/dL — ABNORMAL HIGH (ref 70–99)
Glucose-Capillary: 134 mg/dL — ABNORMAL HIGH (ref 70–99)
Glucose-Capillary: 138 mg/dL — ABNORMAL HIGH (ref 70–99)
Glucose-Capillary: 144 mg/dL — ABNORMAL HIGH (ref 70–99)
Glucose-Capillary: 159 mg/dL — ABNORMAL HIGH (ref 70–99)
Glucose-Capillary: 194 mg/dL — ABNORMAL HIGH (ref 70–99)

## 2022-12-13 LAB — CBC
HCT: 35.7 % — ABNORMAL LOW (ref 39.0–52.0)
Hemoglobin: 11.9 g/dL — ABNORMAL LOW (ref 13.0–17.0)
MCH: 29.7 pg (ref 26.0–34.0)
MCHC: 33.3 g/dL (ref 30.0–36.0)
MCV: 89 fL (ref 80.0–100.0)
Platelets: 173 10*3/uL (ref 150–400)
RBC: 4.01 MIL/uL — ABNORMAL LOW (ref 4.22–5.81)
RDW: 14.2 % (ref 11.5–15.5)
WBC: 12.3 10*3/uL — ABNORMAL HIGH (ref 4.0–10.5)
nRBC: 0 % (ref 0.0–0.2)

## 2022-12-13 LAB — BASIC METABOLIC PANEL
Anion gap: 9 (ref 5–15)
BUN: 28 mg/dL — ABNORMAL HIGH (ref 8–23)
CO2: 24 mmol/L (ref 22–32)
Calcium: 8.4 mg/dL — ABNORMAL LOW (ref 8.9–10.3)
Chloride: 105 mmol/L (ref 98–111)
Creatinine, Ser: 1.36 mg/dL — ABNORMAL HIGH (ref 0.61–1.24)
GFR, Estimated: 51 mL/min — ABNORMAL LOW (ref >60)
Glucose, Bld: 171 mg/dL — ABNORMAL HIGH (ref 70–99)
Potassium: 4.4 mmol/L (ref 3.5–5.1)
Sodium: 138 mmol/L (ref 135–145)

## 2022-12-13 LAB — URINALYSIS, ROUTINE W REFLEX MICROSCOPIC
Bacteria, UA: NONE SEEN
Bilirubin Urine: NEGATIVE
Glucose, UA: NEGATIVE mg/dL
Ketones, ur: NEGATIVE mg/dL
Leukocytes,Ua: NEGATIVE
Nitrite: NEGATIVE
Protein, ur: NEGATIVE mg/dL
Specific Gravity, Urine: 1.024 (ref 1.005–1.030)
Squamous Epithelial / HPF: NONE SEEN /HPF (ref 0–5)
pH: 5 (ref 5.0–8.0)

## 2022-12-13 LAB — MAGNESIUM: Magnesium: 1.7 mg/dL (ref 1.7–2.4)

## 2022-12-13 LAB — PROCALCITONIN
Procalcitonin: 0.35 ng/mL
Procalcitonin: 0.65 ng/mL

## 2022-12-13 LAB — LACTIC ACID, PLASMA: Lactic Acid, Venous: 1.9 mmol/L (ref 0.5–1.9)

## 2022-12-13 LAB — PHOSPHORUS: Phosphorus: 2.8 mg/dL (ref 2.5–4.6)

## 2022-12-13 LAB — HEMOGLOBIN A1C
Hgb A1c MFr Bld: 6.4 % — ABNORMAL HIGH (ref 4.8–5.6)
Mean Plasma Glucose: 136.98 mg/dL

## 2022-12-13 MED ORDER — PIPERACILLIN-TAZOBACTAM 3.375 G IVPB
3.3750 g | Freq: Three times a day (TID) | INTRAVENOUS | Status: DC
  Administered 2022-12-13 – 2022-12-14 (×4): 3.375 g via INTRAVENOUS
  Filled 2022-12-13 (×4): qty 50

## 2022-12-13 MED ORDER — CHLORHEXIDINE GLUCONATE CLOTH 2 % EX PADS
6.0000 | MEDICATED_PAD | Freq: Every day | CUTANEOUS | Status: DC
  Administered 2022-12-13 – 2022-12-16 (×4): 6 via TOPICAL

## 2022-12-13 MED ORDER — INSULIN ASPART 100 UNIT/ML IJ SOLN
0.0000 [IU] | INTRAMUSCULAR | Status: DC
  Administered 2022-12-13: 2 [IU] via SUBCUTANEOUS
  Administered 2022-12-13: 3 [IU] via SUBCUTANEOUS
  Administered 2022-12-13: 2 [IU] via SUBCUTANEOUS
  Administered 2022-12-13 (×2): 3 [IU] via SUBCUTANEOUS
  Administered 2022-12-13 – 2022-12-14 (×4): 2 [IU] via SUBCUTANEOUS
  Administered 2022-12-14: 3 [IU] via SUBCUTANEOUS
  Administered 2022-12-14 (×2): 2 [IU] via SUBCUTANEOUS
  Administered 2022-12-15: 3 [IU] via SUBCUTANEOUS
  Administered 2022-12-15 – 2022-12-16 (×2): 2 [IU] via SUBCUTANEOUS
  Filled 2022-12-13 (×16): qty 1

## 2022-12-13 MED ORDER — DICLOFENAC SODIUM 1 % EX GEL
4.0000 g | Freq: Four times a day (QID) | CUTANEOUS | Status: DC
  Administered 2022-12-13 – 2022-12-17 (×14): 4 g via TOPICAL
  Filled 2022-12-13 (×2): qty 100

## 2022-12-13 MED ORDER — ONDANSETRON HCL 4 MG/5ML PO SOLN
4.0000 mg | Freq: Four times a day (QID) | ORAL | Status: DC | PRN
  Filled 2022-12-13: qty 5

## 2022-12-13 MED ORDER — ONDANSETRON HCL 4 MG/2ML IJ SOLN
4.0000 mg | Freq: Four times a day (QID) | INTRAMUSCULAR | Status: DC | PRN
  Administered 2022-12-15: 4 mg via INTRAVENOUS
  Filled 2022-12-13: qty 2

## 2022-12-13 MED ORDER — MAGNESIUM SULFATE 2 GM/50ML IV SOLN
2.0000 g | Freq: Once | INTRAVENOUS | Status: AC
  Administered 2022-12-13: 2 g via INTRAVENOUS
  Filled 2022-12-13: qty 50

## 2022-12-13 MED ORDER — ONDANSETRON HCL 4 MG/2ML IJ SOLN
INTRAMUSCULAR | Status: AC
  Administered 2022-12-13: 4 mg via INTRAVENOUS
  Filled 2022-12-13: qty 2

## 2022-12-13 MED ORDER — LIDOCAINE 5 % EX PTCH
1.0000 | MEDICATED_PATCH | CUTANEOUS | Status: DC
  Administered 2022-12-13 – 2022-12-17 (×5): 1 via TRANSDERMAL
  Filled 2022-12-13 (×5): qty 1

## 2022-12-13 NOTE — Consult Note (Addendum)
WOC Nurse Consult Note: Reason for Consult: Consult requested for bilat buttocks.  Performed remotely after review of progress notes and photo in the EMR. Pt is critically ill with multiple systemic factors which can impair healing.  Pt has dark red purple-red Deep tissue pressure injuries across bilat buttocks and sacrum; according to bedside nurses' wound care flow sheet: Right is 13X12cm  Left is 16X12cm.   There are deeper areas in the center with are red, moist and macerated with shaggy edges. Appearance is consistent with moisture associated skin damage and "chronic tissue damage." Pt is noted to be incontinent of stool and it will be difficult to keep the affected areas from becoming soiled, related to the close proximity. Deep tissue pressure injuries are high risk to evolve into full thickness tissue loss within 7-10 days, despite topical treatment.  Pressure Injury POA: Yes; according to the bedside nurses wound care flow sheet.  Dressing procedure/placement/frequency: Pt is on a low airloss mattress to reduce pressure. Topical treatment orders provided for bedside nurses to perform as follows: Apply Xeroform gauze to bilat buttocks/sacrum Q day, then cover with foam dressing.  Change foam dressing Q 3 days or PRN soiling.  Please re-consult if further assistance is needed.  Thank-you,  Cammie Mcgee MSN, RN, CWOCN, [place], CNS [phone]

## 2022-12-13 NOTE — Progress Notes (Signed)
NAME:  Scott Buckley, MRN:  [id number], DOB:  1940/10/04, LOS: 1 ADMISSION DATE:  12/12/2022, CONSULTATION DATE:  12/12/22 REFERRING MD:  Dr. Larinda Buttery, CHIEF COMPLAINT:  Syncope and collapse   History of Present Illness:  82 year old male presenting to [facility] ED from home via EMS on 12/12/2022 for evaluation of syncope and collapse.  History provided per chart review & bedside interview with patient and spouse. Patient has been experiencing waxing and waning diarrhea since October 2023.  He reports half a dozen episodes of diarrhea prior to coming to the ED but when asked if this was new or worse than his baseline diarrhea his spouse reported that this was about his baseline may be slightly worse.  He has also had difficulty with his p.o. intake reporting nausea.  Wife reports he is inconsistent with drinking fluids regularly even though she attempts to remind him.  He was on his way to the bathroom when he felt his body became weak and legs gave out falling to the floor and losing consciousness briefly.  He denied blurred vision/seeing spots/headache.  When he came to he was very nauseous and began vomiting.  At this point his wife called 911 and EMS. EMS reported when they attempted to get the patient up to his walker he again lost consciousness and had an episode of vomiting and diarrhea. He endorsed intermittent cramping abdominal pain, denied seeing any blood in his vomit or stool. The patient denied any fever/chills, chest pain, shortness of breath/productive cough, urinary symptoms.  Patient has had 2 similar admissions this year, where he had diarrhea syncopized and was found to be hypotensive, in February 2024 & May 2024.  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 CT cervical spine without contrast 12/12/2022:  Dependent airspace disease suggesting aspiration.  No definitive signs of trauma.  Peritoneal fluid around the spleen and in the pelvis but low-density and without visible visceral injury.  Suggest abdominal exam follow-up.  Right hydroureteronephrosis without visible cause.  Symmetric renal enhancement and excretion CT chest/abdomen/pelvis with contrast 12/12/2022: No acute fracture or traumatic subluxation of the cervical spine  PCCM consulted for admission due to suspected hypovolemic shock +/- sepsis with suspected aspiration pneumonia requiring peripheral vasopressor support.  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 9/6 off pressors alert and awake  Interim History / Subjective:  Alert and awake Off pressors and off oxygen Started diet    Objective   Blood pressure 127/72, pulse 72, temperature 98.3 F (36.8 C), temperature source Oral, resp. rate 16, height 6\' 3"  (1.905 m), weight 101.5 kg, SpO2 93%.        Intake/Output Summary (Last 24 hours) at 12/13/2022 1047 Last data filed at 12/13/2022 0900 Gross per 24 hour  Intake 1296.4 ml  Output 750 ml  Net 546.4 ml   Filed Weights   12/12/22 1543 12/13/22 0431  Weight: 99.8 kg 101.5 kg      Review of Systems: Gen:  Denies  fever,  sweats, chills weight loss  HEENT: Denies blurred vision, double vision, ear pain, eye pain, hearing loss, nose bleeds, sore throat Cardiac:  No dizziness, chest pain or heaviness, chest tightness,edema, No JVD Resp:   No cough, -sputum production, -shortness of breath,-wheezing, -hemoptysis,  Other:  All other systems  negative   Physical Examination:   General Appearance: No distress  EYES PERRLA, EOM intact.   NECK Supple, No JVD Pulmonary: normal breath sounds, No wheezing.  CardiovascularNormal S1,S2.  No m/r/g.   Abdomen: Benign, Soft, non-tender. Neurology UE/LE 5/5 strength, no focal deficits Ext pulses intact, cap refill intact Skin see pic below ALL OTHER ROS ARE NEGATIVE      Assessment & Plan:  Syncope and collapse suspect secondary to hypovolemic shock Hypovolemic shock +/- sepsis suspect secondary to dehydration from chronic diarrhea/vomiting and poor p.o. intake Wide-complex rhythm with left bundle branch block -resolving with IVF's   Suspected sepsis -slowly resolving Aspiration pneumonia Chronic persistent diarrhea/gastroenteritis Initial interventions/workup included: 3 L of NS/LR & Cefepime/ Vancomycin/ Metronidazole.  C. 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, and residual urinary retention/incomplete bladder emptying  Chronic right hydronephrosis Baseline Cr: 1.2 (February 2024), most recent Cr 1.3 (June 2024) on admission: 1.66 - Strict I/O's: alert provider if UOP < 0.5 mL/kg/hr - gentle IVF hydration  - Daily BMP, replace electrolytes PRN -Continue outpatient Finasteride, hold Flomax at this time due to syncope > consider restarting as patient stabilizes -Bladder scan every 6 h, monitor PVR, I & O catheterization was required > consider Foley catheter insertion if needed - Avoid nephrotoxic agents as able, ensure adequate renal perfusion  Enlarging cerebral aneurysm PMHx: Iliac artery aneurysm s/p endovascular repair EDP spoke with neurosurgery  on-call will follow-up with patient in a.m. does not recommend CTA or any other intervention at this time. -Neurosurgery consulted, appreciate input -Every 4 neurochecks  Hyperlipidemia - continue outpatient Crestor Excoriation & DTI on bilateral buttocks - woc consulted, appreciate input - Q 2 turn - utilize offloading devices   ENDO - ICU hypoglycemic\Hyperglycemia protocol -check FSBS per protocol   GI GI PROPHYLAXIS as indicated  NUTRITIONAL STATUS DIET--> as tolerated Constipation protocol as indicated   ELECTROLYTES -follow labs as needed -replace as needed -pharmacy consultation and following   Best Practice (right click and "Reselect all SmartList Selections" daily)  Diet/type: NPO w/ oral meds DVT prophylaxis: LMWH GI prophylaxis: PPI Lines: N/A Foley:  N/A Code Status:  full code Last date of multidisciplinary goals of care discussion [12/12/22]  Labs   CBC: Recent Labs  Lab 12/12/22 1550 12/13/22 0502  WBC 14.3* 12.3*  NEUTROABS 8.7*  --   HGB 14.2 11.9*  HCT 44.8 35.7*  MCV 92.4 89.0  PLT 231 173    Basic Metabolic Panel: Recent Labs  Lab 12/12/22 1550 12/12/22 2300 12/13/22 0502  NA 138  --  138  K 3.5  --  4.4  CL 103  --  105  CO2 21*  --  24  GLUCOSE 201*  --  171*  BUN 29*  --  28*  CREATININE 1.66*  --  1.36*  CALCIUM 9.1  --  8.4*  MG 2.1  --  1.7  PHOS  --  3.2 2.8   GFR: Estimated Creatinine Clearance: 52.2 mL/min (A) (by C-G formula based on SCr of 1.36 mg/dL (H)). Recent Labs  Lab 12/12/22 1550 12/12/22 1618 12/12/22 1804 12/12/22 2300 12/13/22 0502  PROCALCITON  --   --   --  0.35 0.65  WBC 14.3*  --   --   --  12.3*  LATICACIDVEN  --  4.5* 3.3* 2.7* 1.9    Liver Function Tests: Recent Labs  Lab 12/12/22 1550  AST 20  ALT 11  ALKPHOS 49  BILITOT 0.3  PROT 6.8  ALBUMIN 3.4*   Recent Labs  Lab 12/12/22 1550  LIPASE 49   No results for input(s): "AMMONIA" in the last 168 hours.  ABG No results  found for: "PHART", "PCO2ART", "PO2ART", "HCO3", "TCO2", "ACIDBASEDEF", "O2SAT"   Coagulation Profile: No results for input(s): "INR", "PROTIME" in the last 168 hours.  Cardiac Enzymes: Recent Labs  Lab 12/12/22 2300  CKTOTAL 51    HbA1C: Hgb A1c MFr Bld  Date/Time Value Ref Range Status  12/12/2022 11:00 PM 6.4 (H) 4.8 - 5.6 % Final    Comment:    (NOTE) Pre diabetes:          5.7%-6.4%  Diabetes:              >6.4%  Glycemic control for   <7.0% adults with diabetes   01/26/2022 05:20 AM 6.0 (H) 4.8 - 5.6 % Final    Comment:    (NOTE) Pre diabetes:          5.7%-6.4%  Diabetes:              >6.4%  Glycemic control for   <7.0% adults with diabetes     CBG: Recent Labs  Lab 12/12/22 2205 12/13/22 0122 12/13/22 0331 12/13/22 0738  GLUCAP 176* 194* 159* 144*     DVT/GI PRX  assessed I Assessed the need for Labs I Assessed the need for Foley I Assessed the need for Central Venous Line Family Discussion when available I Assessed the need for Mobilization I made an Assessment of medications to be adjusted accordingly Safety Risk assessment completed  CASE DISCUSSED IN MULTIDISCIPLINARY ROUNDS WITH ICU TEAM   SD status and transfer to [facility]  Franki Stemen Santiago Glad, M.D.  [facility]  Medical Director [facility] Medical Director [facility] Cardio-Pulmonary Department

## 2022-12-13 NOTE — Progress Notes (Signed)
Rounding Note    Patient Name: Scott Buckley Date of Encounter: 12/13/2022  [facility] Cardiologist: None   Subjective   The patient reports he has back pain. Tele shows NSR with 1st degree AV block and RBBB. No chest pain or SOB reported.   Inpatient Medications    Scheduled Meds:  Chlorhexidine Gluconate Cloth  6 each Topical Daily   diclofenac Sodium  4 g Topical QID   enoxaparin (LOVENOX) injection  40 mg Subcutaneous Q24H   finasteride  5 mg Oral Daily   insulin aspart  0-15 Units Subcutaneous Q4H   lamoTRIgine  25 mg Oral Daily   lidocaine  1 patch Transdermal Q24H   pantoprazole (PROTONIX) IV  40 mg Intravenous Q24H   rosuvastatin  20 mg Oral Daily   Continuous Infusions:  sodium chloride Stopped (12/12/22 2200)   lactated ringers 100 mL/hr at 12/13/22 0900   norepinephrine (LEVOPHED) Adult infusion Stopped (12/13/22 0741)   piperacillin-tazobactam (ZOSYN)  IV Stopped (12/13/22 0534)   PRN Meds: acetaminophen, docusate sodium, loperamide, ondansetron (ZOFRAN) IV, polyethylene glycol   Vital Signs    Vitals:   12/13/22 0900 12/13/22 0915 12/13/22 1000 12/13/22 1100  BP: 121/66 134/79 127/72 128/67  Pulse: 73 80 72 68  Resp: 18 16 16 15   Temp:      TempSrc:      SpO2: 100% 96% 93% 95%  Weight:      Height:        Intake/Output Summary (Last 24 hours) at 12/13/2022 1218 Last data filed at 12/13/2022 1047 Gross per 24 hour  Intake 1316.4 ml  Output 750 ml  Net 566.4 ml      12/13/2022    4:31 AM 12/12/2022    3:43 PM 10/25/2022    8:52 AM  Last 3 Weights  Weight (lbs) 223 lb 12.3 oz 220 lb 214 lb  Weight (kg) 101.5 kg 99.791 kg 97.07 kg      Telemetry    NSR 1st degree AV block, RBBB - Personally Reviewed  ECG    No new - Personally Reviewed  Physical Exam   GEN: No acute distress.   Neck: No JVD Cardiac: RRR, no murmurs, rubs, or gallops.  Respiratory: Clear to auscultation bilaterally. GI: Soft, nontender, non-distended  MS:  No edema; No deformity. Neuro:  Nonfocal  Psych: Normal affect   Labs    High Sensitivity Troponin:   Recent Labs  Lab 12/12/22 1550 12/12/22 1804  TROPONINIHS 6 6     Chemistry Recent Labs  Lab 12/12/22 1550 12/13/22 0502  NA 138 138  K 3.5 4.4  CL 103 105  CO2 21* 24  GLUCOSE 201* 171*  BUN 29* 28*  CREATININE 1.66* 1.36*  CALCIUM 9.1 8.4*  MG 2.1 1.7  PROT 6.8  --   ALBUMIN 3.4*  --   AST 20  --   ALT 11  --   ALKPHOS 49  --   BILITOT 0.3  --   GFRNONAA 40* 51*  ANIONGAP 14 9    Lipids No results for input(s): "CHOL", "TRIG", "HDL", "LABVLDL", "LDLCALC", "CHOLHDL" in the last 168 hours.  Hematology Recent Labs  Lab 12/12/22 1550 12/13/22 0502  WBC 14.3* 12.3*  RBC 4.85 4.01*  HGB 14.2 11.9*  HCT 44.8 35.7*  MCV 92.4 89.0  MCH 29.3 29.7  MCHC 31.7 33.3  RDW 14.0 14.2  PLT 231 173   Thyroid No results for input(s): "TSH", "FREET4" in the last 168  hours.  BNPNo results for input(s): "BNP", "PROBNP" in the last 168 hours.  DDimer No results for input(s): "DDIMER" in the last 168 hours.   Radiology    CT CHEST ABDOMEN PELVIS W CONTRAST  Result Date: 12/12/2022 CLINICAL DATA:  Fall.  Covered in vomitus. EXAM: CT CHEST, ABDOMEN, AND PELVIS WITH CONTRAST TECHNIQUE: Multidetector CT imaging of the chest, abdomen and pelvis was performed following the standard protocol during bolus administration of intravenous contrast. RADIATION DOSE REDUCTION: This exam was performed according to the departmental dose-optimization program which includes automated exposure control, adjustment of the mA and/or kV according to patient size and/or use of iterative reconstruction technique. CONTRAST:  80mL OMNIPAQUE IOHEXOL 300 MG/ML  SOLN COMPARISON:  09/12/2022 FINDINGS: CT CHEST FINDINGS Cardiovascular: Borderline heart size. No pericardial effusion. There is atheromatous calcification of the aorta and coronaries. Mediastinum/Nodes: Negative for mass or adenopathy. No hematoma or  pneumomediastinum Lungs/Pleura: Hazy airspace opacity in the dependent lungs especially affecting the lower lobes where there is right more than left airway opacification. No pleural fluid, pneumothorax, or contusion like appearance. Musculoskeletal: No acute fracture or traumatic malalignment CT ABDOMEN PELVIS FINDINGS Hepatobiliary: No hepatic injury or perihepatic hematoma. Gallbladder is unremarkable. Pancreas: No visible injury. Spleen: Low-density fluid around the spleen. No visible laceration or contusion Adrenals/Urinary Tract: Right hydronephrosis without visible obstructing process. Clips are seen around the right kidney and ureter. Thick walled bladder circumferentially suggesting chronic outlet obstruction Stomach/Bowel: No specific signs of injury. Vascular/Lymphatic: Aorta bi-iliac stenting which is diffusely patent. No evidence of injury or adenopathy Reproductive: Enlarged prostate with calcifications and markers, largely obscured due to streak artifact from a left hip prosthesis. Other: Low-density ascites in the pelvis. No pneumoperitoneum. Lax abdominal wall. Musculoskeletal: Left hip prosthesis which is located. Embolization coils at the left sciatic notch. Severe right hip osteoarthritis. Intrinsic back muscle show diffuse atrophy. Advanced lumbar spine degeneration with mild scoliosis and L4-5 anterolisthesis. IMPRESSION: 1. Dependent airspace disease, pattern and history suggesting aspiration. 2. No definitive signs of trauma. There is peritoneal fluid around the spleen and in the pelvis but low-density and without visible visceral injury. Suggest abdominal exam follow-up. 3. Right hydroureteronephrosis without visible cause. Symmetric renal enhancement and excretion. 4. Chronic findings are stable and described above. Electronically Signed   By: Tiburcio Pea M.D.   On: 12/12/2022 19:01   CT Head Wo Contrast  Result Date: 12/12/2022 CLINICAL DATA:  Head trauma, minor (Age >= 65y); Neck  trauma (Age >= 65y) EXAM: CT HEAD WITHOUT CONTRAST CT CERVICAL SPINE WITHOUT CONTRAST TECHNIQUE: Multidetector CT imaging of the head and cervical spine was performed following the standard protocol without intravenous contrast. Multiplanar CT image reconstructions of the cervical spine were also generated. RADIATION DOSE REDUCTION: This exam was performed according to the departmental dose-optimization program which includes automated exposure control, adjustment of the mA and/or kV according to patient size and/or use of iterative reconstruction technique. COMPARISON:  CT Head 08/15/17 FINDINGS: CT HEAD FINDINGS Brain: No hemorrhage. No hydrocephalus. No extra-axial fluid collection. No CT evidence of an acute cortical infarct. 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 This has increased in size compared to 2019 at which time it measured up to 2.5 x 2.0 x 2.1 cm. There is likely also fusiform dilatation of the M1 segment of the left MCA. Skull: Normal. Negative for fracture or focal lesion. Sinuses/Orbits: 2 no middle ear or mastoid effusion. Paranasal sinuses are clear. Bilateral lens  replacement. Orbits are otherwise unremarkable. Other: None. CT CERVICAL SPINE FINDINGS Alignment: Trace anterolisthesis of C2 on C3 and retrolisthesis of C3 on C4. Trace anterolisthesis of C4 on C5. Skull base and vertebrae: No acute fracture. No primary bone lesion or focal pathologic process. Soft tissues and spinal canal: No prevertebral fluid or swelling. No visible canal hematoma. Disc levels:  No evidence of high-grade spinal canal stenosis Upper chest: Negative. Other: None IMPRESSION: 1. No acute intracranial abnormality. 2. No acute fracture or traumatic subluxation of the cervical spine. 3. 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 This has increased in size compared to 2019 at which time it measured up to 2.5 x 2.0 x 2.1 Cm. There is  likely also fusiform dilatation of the M1 segment of the left MCA. Recommend further evaluation with CTA. Neurosurgical consultation is also recommended, if this was not previously performed. Electronically Signed   By: Lorenza Cambridge M.D.   On: 12/12/2022 18:44   CT Cervical Spine Wo Contrast  Result Date: 12/12/2022 CLINICAL DATA:  Head trauma, minor (Age >= 65y); Neck trauma (Age >= 65y) EXAM: CT HEAD WITHOUT CONTRAST CT CERVICAL SPINE WITHOUT CONTRAST TECHNIQUE: Multidetector CT imaging of the head and cervical spine was performed following the standard protocol without intravenous contrast. Multiplanar CT image reconstructions of the cervical spine were also generated. RADIATION DOSE REDUCTION: This exam was performed according to the departmental dose-optimization program which includes automated exposure control, adjustment of the mA and/or kV according to patient size and/or use of iterative reconstruction technique. COMPARISON:  CT Head 08/15/17 FINDINGS: CT HEAD FINDINGS Brain: No hemorrhage. No hydrocephalus. No extra-axial fluid collection. No CT evidence of an acute cortical infarct. 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 This has increased in size compared to 2019 at which time it measured up to 2.5 x 2.0 x 2.1 cm. There is likely also fusiform dilatation of the M1 segment of the left MCA. Skull: Normal. Negative for fracture or focal lesion. Sinuses/Orbits: 2 no middle ear or mastoid effusion. Paranasal sinuses are clear. Bilateral lens replacement. Orbits are otherwise unremarkable. Other: None. CT CERVICAL SPINE FINDINGS Alignment: Trace anterolisthesis of C2 on C3 and retrolisthesis of C3 on C4. Trace anterolisthesis of C4 on C5. Skull base and vertebrae: No acute fracture. No primary bone lesion or focal pathologic process. Soft tissues and spinal canal: No prevertebral fluid or swelling. No visible canal hematoma. Disc levels:  No evidence of  high-grade spinal canal stenosis Upper chest: Negative. Other: None IMPRESSION: 1. No acute intracranial abnormality. 2. No acute fracture or traumatic subluxation of the cervical spine. 3. 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 This has increased in size compared to 2019 at which time it measured up to 2.5 x 2.0 x 2.1 Cm. There is likely also fusiform dilatation of the M1 segment of the left MCA. Recommend further evaluation with CTA. Neurosurgical consultation is also recommended, if this was not previously performed. Electronically Signed   By: Lorenza Cambridge M.D.   On: 12/12/2022 18:44    Cardiac Studies   Echo 05/2022 1. Left ventricular ejection fraction, by estimation, is 60 to 65%. The  left ventricle has normal function. The left ventricle has no regional  wall motion abnormalities. There is mild left ventricular hypertrophy.  Left ventricular diastolic parameters  are consistent with Grade I diastolic  dysfunction (impaired relaxation).   2. Right ventricular systolic function is normal. The right ventricular  size is normal. There is normal pulmonary artery systolic pressure.   3. Left atrial size was mildly dilated.   4. Right atrial size was mildly dilated.   5. The mitral valve is normal in structure. Mild mitral valve  regurgitation. No evidence of mitral stenosis.   6. The aortic valve is normal in structure. Aortic valve regurgitation is  mild. Aortic valve sclerosis/calcification is present, without any  evidence of aortic stenosis.   7. Aortic dilatation noted. There is mild dilatation of the aortic root,  measuring 42 mm.   8. The inferior vena cava is dilated in size with >50% respiratory  variability, suggesting right atrial pressure of 8 mmHg.    Patient Profile     82 y.o. male  type 2 diabetes, hypertension, chronic kidney disease stage IIIa, prostate cancer, urinary retention, iliac artery aneurysm status post endovascular  repair, history of presyncope and syncope, who is being seen 12/12/2022 for the evaluation of abnormal EKG   Assessment & Plan    Syncope with collapse Abnormal EKG - patient reported presyncope after 2 loose BMs with associated abdominal pain. He later had syncope and EMS found him to tbe hypotensive. Wife reported he hit his head - has had multiple admissions related to syncope with unrevealing work-up - found to be septic on admission - Prior Echo showed normal LVEF and no significant carotid duplex. - EKG showed NSR with RBBB to idioventricular rhythm with LBBB, felt to be due to Sepsis and other acute issues - tele shows NSR with no high grade block - Keep Mag>2 and K>4 - plan for heart monitor at d/c  Sepsis of unknown origin Aspiration PNA Gastroenteritis/hypotension/weakness - presented with nausea, vomiting and diarrhea - LA 4.5>>>1.9 - s/p Ivf and IV abx - per IM  Hypotension with h/o HTN - in the setting of sepsis - s/p IVF - PTA BP meds held  AKI on CKD stage 3 - Baseline Scr 1.3-2.5 - Scr 1.66 on admission  Enlarging cerebral aneurysm - Neurosurgery consulted  For questions or updates, please contact  [facility] Please consult [email or web address] for contact info under        Signed, Sharman Garrott David Stall, PA-C  12/13/2022, 12:18 PM

## 2022-12-13 NOTE — TOC Initial Note (Addendum)
Transition of Care [facility]) - Initial/Assessment Note    Patient Details  Name: Scott Buckley MRN: [id number] Date of Birth: 1941-01-03  Transition of Care [facility]) CM/SW Contact:    Kreg Shropshire, RN Phone Number: 12/13/2022, 8:52 AM  Clinical Narrative:                 CM spoke to pt with son at bedside. Readmission prevention screening completed.  Pt arrived from ED from: home Caregiver Support: Lives with wife DME at Home: Dan Humphreys and Occupational psychologist: family Previous Services: Active with [facility] PT HH/SNF Preference:none First Person of Contact: Spouse Okey Regal PCP: Debbra Riding, PA  Cm will continue to follow for toc needs and d/c planning.     Barriers to Discharge: Continued Medical Work up   Patient Goals and CMS Choice   CMS Medicare.gov Compare Post Acute Care list provided to:: Patient Choice offered to / list presented to : Patient      Expected Discharge Plan and Services     Post Acute Care Choice: Home Health, Skilled Nursing Facility Living arrangements for the past 2 months: Single Family Home                                      Prior Living Arrangements/Services Living arrangements for the past 2 months: Single Family Home Lives with:: Self, Spouse          Need for Family Participation in Patient Care: Yes (Comment) Care giver support system in place?: Yes (comment) Current home services: DME    Activities of Daily Living      Permission Sought/Granted                  Emotional Assessment       Orientation: : Oriented to Self, Oriented to Place, Oriented to  Time, Oriented to Situation      Admission diagnosis:  Syncope and collapse [R55] Acute respiratory failure with hypoxia (HCC) [J96.01] Septic shock (HCC) [A41.9, R65.21] Aspiration into airway, initial encounter [T17.908A] Sepsis due to pneumonia (HCC) [J18.9, A41.9] Syncope, unspecified syncope type [R55] Patient Active Problem List   Diagnosis  Date Noted   Functional diarrhea 12/12/2022   Hypotension due to hypovolemia 12/12/2022   PAD (peripheral artery disease) (HCC) 12/12/2022   Wide-complex tachycardia 12/12/2022   Sepsis due to pneumonia (HCC) 12/12/2022   Syncope and collapse 12/12/2022   Sepsis due to undetermined organism (HCC) 05/19/2022   Vasovagal syncopes 05/19/2022   Acute kidney injury superimposed on chronic kidney disease (HCC) 05/19/2022   Abdominal pain 05/19/2022   Syncope 05/19/2022   Pressure injury of skin 01/26/2022   Urinary retention 01/26/2022   Acute lower UTI 01/25/2022   Generalized weakness 01/25/2022   Essential hypertension 01/25/2022   Type 2 diabetes mellitus with chronic kidney disease, without long-term current use of insulin (HCC) 01/25/2022   Dyslipidemia 01/25/2022   BPH (benign prostatic hyperplasia) 01/25/2022   Chronic renal failure (CRF), stage 3b (HCC) 01/25/2022   Personal history of other malignant neoplasm of skin 07/06/2019   Mechanical ectropion of right lower eyelid 10/26/2018   Visual field defect 10/26/2018   B12 deficiency 06/10/2018   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   Ectropion of left lower eyelid 08/22/2017   Elevated erythrocyte sedimentation rate 08/18/2017   Screening for prostate cancer 08/14/2017   Cellulitis of left lower extremity 07/17/2017   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 07/12/2016   Sepsis (HCC) 07/01/2016   UTI (urinary tract infection) 07/01/2016   HTN (hypertension) 07/01/2016   Diabetes (HCC) 07/01/2016   CKD (chronic kidney disease), stage III (HCC) 07/01/2016   Nephrolithiasis 01/01/2016   Chronic left-sided low back pain without sciatica 11/02/2015   Malignant neoplasm of prostate (HCC) 10/11/2014   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   PCP:  Wilford Corner, PA-C Pharmacy:   [facility] -  [place], [place] - [address] [place] [address] Phone: [phone] Fax: [phone]     Social Determinants of Health (SDOH) Social History: SDOH Screenings   Food Insecurity: No Food Insecurity (08/12/2022)   Received from [facility], [facility]  Housing: Low Risk  (05/20/2022)  Transportation Needs: No Transportation Needs (08/12/2022)   Received from [facility], [facility]  Utilities: Not At Risk (05/20/2022)  Depression (PHQ2-9): Low Risk  (05/10/2019)  Financial Resource Strain: Low Risk  (08/12/2022)   Received from [facility], [facility]  Tobacco Use: High Risk (12/12/2022)   SDOH Interventions:     Readmission Risk Interventions    12/13/2022    8:51 AM  Readmission Risk Prevention Plan  Transportation Screening Complete  PCP or Specialist Appt within 3-5 Days Not Complete  Not Complete comments in icu still inpt  HRI or Home Care Consult Complete  Social Work Consult for Recovery Care Planning/Counseling Complete  Palliative Care Screening Not Applicable  Medication Review (RN Care Manager) Referral to Pharmacy

## 2022-12-13 NOTE — Progress Notes (Signed)
PT Cancellation Note  Patient Details Name: Scott Buckley MRN: [id number] DOB: 1941-02-12   Cancelled Treatment:     Attempted to perform OT/PT co-evaluation but Pt's rectal tube appeared loose and/or disconnected. Bedside RN entered room briefly after to do a bladder scanner and assess rectal tube. Will plan to re-attempt PT/OT evaluation at a later time.   [name] 12/13/2022, 2:05 PM

## 2022-12-13 NOTE — Progress Notes (Signed)
Pharmacy Antibiotic Note  Scott Buckley is a 82 y.o. male admitted on 12/12/2022 with possible aspiration pneumonia.  Pharmacy has been consulted for Zosyn dosing.  Plan: Zosyn 3.375g q8h (4 hr infusion) for 5 days per indication & renal fxn.  Pharmacy will continue to follow and will adjust abx dosing whenever warranted.  Temp (24hrs), Avg:97.9 F (36.6 C), Min:97.8 F (36.6 C), Max:98 F (36.7 C)   Recent Labs  Lab 12/12/22 1550 12/12/22 1618 12/12/22 1804 12/12/22 2300  WBC 14.3*  --   --   --   CREATININE 1.66*  --   --   --   LATICACIDVEN  --  4.5* 3.3* 2.7*    Estimated Creatinine Clearance: 39.6 mL/min (A) (by C-G formula based on SCr of 1.66 mg/dL (H)).    Allergies  Allergen Reactions   Metformin Diarrhea   Sulfa Antibiotics Nausea And Vomiting and Other (See Comments)    Patient states not allergic   Varicella Virus Vaccine Live Other (See Comments)    Cramps and weakness   Zoster Vac Recomb Adjuvanted     Other reaction(s): Not available   Zoster Vaccine Recombinant, Adjuvanted     Other Reaction(s): Unknown  Other reaction(s): Not available    Antimicrobials this admission: 9/6 Zosyn >> x 5 days 9/5 Cefepime >> x 1 dose 9/5 Flagyl >> x 1 dose 9/5 Vancomycin >> x 1 dose  Microbiology results: 9/5 BCx: Pending 9/5 MRSA PCR: Negative  Thank you for allowing pharmacy to be a part of this patient's care.  Otelia Sergeant, PharmD, [facility] 12/13/2022 12:29 AM

## 2022-12-13 NOTE — Consult Note (Signed)
Consultation Note Date: 12/13/2022 at 1215  Patient Name: Scott Buckley  DOB: [birth year]  MRN: [id number]  Age / Sex: 82 y.o., male  PCP: Wilford Corner, PA-C Referring Physician: Erin Fulling, MD  Reason for Consultation: Establishing goals of care  HPI/Patient Profile: 82 y.o. male  with past medical history of HTN, type 2 diabetes, CKD, skin cancer, prostate cancer, Parkinson's, HLD, BPH s/p UroLift (2023), and iliac artery aneurysm s/p endovascular repair admitted on 12/12/2022 with fall at home with diarrhea and loss of consciousness.  Patient is being treated for hypovolemic shock and suspected aspiration pneumonia.  PMT was consulted to reviewed goals of care.   Clinical Assessment and Goals of Care: I have reviewed medical records including EPIC notes, labs and imaging, assessed the patient and then met with patient at bedside to discuss diagnosis prognosis, GOC, EOL wishes, disposition and options.  I introduced Palliative Medicine as specialized medical care for people living with serious illness. It focuses on providing relief from the symptoms and stress of a serious illness. The goal is to improve quality of life for both the patient and the family.  We discussed a brief life review of the patient. He is married with two sons and one daughter who all live nearby/help in his care. 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   As far as functional and nutritional status patient endorses multiple falls at home but none within the last few months. He walks with a cane and walker at home. He shares he has a healthy appetite and no nutritional issues.   We discussed patient's current illness and what it means in the larger context of patient's on-going co-morbidities. Education provided on aspiration PNA, hypotension, pressors (which have been turned off at this time) and  natural disease trajectory discussed.  I attempted to elicit values and goals of care important to the patient. Patietn endorses he has completed advance directive and living well several years ago.  He does not recall what it said.  CODE STATUS, artificial feeding and hydration, and other boundaries of care reviewed in detail.  Patient is accepting of all offered, available, and appropriate medical interventions to sustain his life.  He was not able to comment on long-term use of ventilator/tracheostomy.  He shares he would like to discuss his wishes with his wife when she comes bedside.  However, he was very clear with me that he wants everything done to keep him alive.  Symptoms assessed.  Patient complains of pain in his mid back.  It is nonradiating, feels "achy" and worsens with movement.  Lidocaine patch has been applied.  Discussed use of Tylenol and as needed Robaxin to relieve pain.  Patient was in agreement.  Advised RN to give dose of Tylenol now.   Discussed with patient the importance of continued conversation with family and the medical providers regarding overall plan of care and treatment options, ensuring decisions are within the context of the patient's values and GOCs.  Questions and concerns were addressed. The family was encouraged to call with questions or concerns.   PMT will continue to follow and support.  Of note, there is no weekend PMT coverage.  I plan to return on Tuesday.  Please utilize amion for any acute palliative needs until that time.  Primary Decision Maker PATIENT  Physical Exam Vitals reviewed.  Constitutional:      General: He is not in acute distress.    Appearance: He is normal weight.  HENT:     Head: Normocephalic.     Nose: Nose normal.     Mouth/Throat:     Mouth: Mucous membranes are moist.  Eyes:     Pupils: Pupils are equal, round, and reactive to light.  Cardiovascular:     Rate and Rhythm: Normal rate.     Pulses: Normal pulses.   Pulmonary:     Effort: Pulmonary effort is normal.  Abdominal:     Palpations: Abdomen is soft.  Musculoskeletal:     Comments: Generalized weakness  Skin:    General: Skin is warm and dry.  Neurological:     Mental Status: He is alert and oriented to person, place, and time.  Psychiatric:        Mood and Affect: Mood normal.        Behavior: Behavior normal.        Thought Content: Thought content normal.        Judgment: Judgment normal.     Palliative Assessment/Data: 60%     Thank you for this consult. Palliative medicine will continue to follow and assist holistically.   Time Total: 75 minutes  Signed by: Georgiann Cocker, DNP, FNP-BC Palliative Medicine    Please contact Palliative Medicine Team phone at [phone] for questions and concerns.  For individual provider: See Loretha Stapler

## 2022-12-13 NOTE — Consult Note (Signed)
PHARMACY CONSULT NOTE - ELECTROLYTES  Pharmacy Consult for Electrolyte Monitoring and Replacement   Recent Labs: Height: 6'3" (190.5 cm) Weight: 101.5 kg (223 lb 12.3 oz) Estimated Creatinine Clearance: 52.2 mL/min (by C-G formula based on SCr of 1.36 mg/dL). Potassium (mmol/L)  Date Value  12/13/2022 4.4   Magnesium (mg/dL)  Date Value  11/91/4782 1.7   Calcium (mg/dL)  Date Value  95/62/1308 8.4 (L)   Calcium, Total (mg/dL)  Date Value  -- --   Albumin (g/dL)  Date Value  65/78/4696 3.4 (L)   Phosphorus (mg/dL)  Date Value  29/52/8413 2.8   Sodium (mmol/L)  Date Value  12/13/2022 138   Assessment Scott Buckley is a 82 y.o. male admitted 12/12/2022 for hypovolemic shock/sepsis with suspected aspiration pneumonia. PMH includes HTN, T2DM, CKD stage 3a, HLD, BPH s/p Urolift 2023, Parkinson's, and iliac artery aneurysm s/p endovascular repair   Goal of Therapy: Electrolytes WNL  Plan:  2g IV magnesium sulfate per NP Recheck electrolytes tomorrow morning  Thank you for involving pharmacy in this patient's care.   Darolyn Rua, PharmD Student [employer]

## 2022-12-13 NOTE — Progress Notes (Signed)
OT Cancellation Note  Patient Details Name: Scott Buckley MRN: [id number] DOB: [birth year]   Cancelled Treatment:    Reason Eval/Treat Not Completed: Other (comment). Consult received, chart reviewed. Attempted to perform OT/PT co-evaluation but Pt's rectal tube appeared loose and/or disconnected. Bedside RN entered room briefly after to do a bladder ultrasound and assess rectal tube and provide bath. Will plan to re-attempt PT/OT evaluation at a later time.   Arman Filter., MPH, MS, OTR/L ascom [phone] 12/13/22, 2:32 PM

## 2022-12-14 ENCOUNTER — Inpatient Hospital Stay (HOSPITAL_COMMUNITY)
Admit: 2022-12-14 | Discharge: 2022-12-14 | Disposition: A | Discharge status: Home / Self Care | Payer: Medicare HMO | Attending: Internal Medicine | Admitting: Internal Medicine

## 2022-12-14 ENCOUNTER — Inpatient Hospital Stay
Admit: 2022-12-14 | Discharge: 2022-12-14 | Disposition: A | Discharge status: Home / Self Care | Payer: Medicare HMO | Attending: Internal Medicine | Admitting: Internal Medicine

## 2022-12-14 DIAGNOSIS — J9601 Acute respiratory failure with hypoxia: Secondary | ICD-10-CM | POA: Diagnosis not present

## 2022-12-14 DIAGNOSIS — A419 Sepsis, unspecified organism: Secondary | ICD-10-CM | POA: Diagnosis not present

## 2022-12-14 DIAGNOSIS — T17908A Unspecified foreign body in respiratory tract, part unspecified causing other injury, initial encounter: Secondary | ICD-10-CM | POA: Diagnosis not present

## 2022-12-14 DIAGNOSIS — R55 Syncope and collapse: Secondary | ICD-10-CM | POA: Diagnosis not present

## 2022-12-14 DIAGNOSIS — K8689 Other specified diseases of pancreas: Secondary | ICD-10-CM | POA: Insufficient documentation

## 2022-12-14 DIAGNOSIS — R571 Hypovolemic shock: Secondary | ICD-10-CM | POA: Diagnosis not present

## 2022-12-14 DIAGNOSIS — R000 Tachycardia, unspecified: Secondary | ICD-10-CM

## 2022-12-14 DIAGNOSIS — I517 Cardiomegaly: Secondary | ICD-10-CM | POA: Diagnosis not present

## 2022-12-14 DIAGNOSIS — K591 Functional diarrhea: Secondary | ICD-10-CM

## 2022-12-14 DIAGNOSIS — J690 Pneumonitis due to inhalation of food and vomit: Secondary | ICD-10-CM | POA: Diagnosis not present

## 2022-12-14 DIAGNOSIS — N1831 Chronic kidney disease, stage 3a: Secondary | ICD-10-CM

## 2022-12-14 LAB — RENAL FUNCTION PANEL
Albumin: 2.9 g/dL — ABNORMAL LOW (ref 3.5–5.0)
Anion gap: 8 (ref 5–15)
BUN: 27 mg/dL — ABNORMAL HIGH (ref 8–23)
CO2: 26 mmol/L (ref 22–32)
Calcium: 8.8 mg/dL — ABNORMAL LOW (ref 8.9–10.3)
Chloride: 103 mmol/L (ref 98–111)
Creatinine, Ser: 1.5 mg/dL — ABNORMAL HIGH (ref 0.61–1.24)
GFR, Estimated: 46 mL/min — ABNORMAL LOW (ref >60)
Glucose, Bld: 132 mg/dL — ABNORMAL HIGH (ref 70–99)
Phosphorus: 2.8 mg/dL (ref 2.5–4.6)
Potassium: 4 mmol/L (ref 3.5–5.1)
Sodium: 137 mmol/L (ref 135–145)

## 2022-12-14 LAB — ECHOCARDIOGRAM LIMITED
Height: 75 in
S' Lateral: 4.1 cm
Weight: 3580.27 [oz_av]

## 2022-12-14 LAB — GLUCOSE, CAPILLARY
Glucose-Capillary: 137 mg/dL — ABNORMAL HIGH (ref 70–99)
Glucose-Capillary: 129 mg/dL — ABNORMAL HIGH (ref 70–99)
Glucose-Capillary: 125 mg/dL — ABNORMAL HIGH (ref 70–99)
Glucose-Capillary: 123 mg/dL — ABNORMAL HIGH (ref 70–99)
Glucose-Capillary: 152 mg/dL — ABNORMAL HIGH (ref 70–99)

## 2022-12-14 LAB — CBC
HCT: 30.9 % — ABNORMAL LOW (ref 39.0–52.0)
Hemoglobin: 10.3 g/dL — ABNORMAL LOW (ref 13.0–17.0)
MCH: 29.8 pg (ref 26.0–34.0)
MCHC: 33.3 g/dL (ref 30.0–36.0)
MCV: 89.3 fL (ref 80.0–100.0)
Platelets: 156 10*3/uL (ref 150–400)
RBC: 3.46 MIL/uL — ABNORMAL LOW (ref 4.22–5.81)
RDW: 14.2 % (ref 11.5–15.5)
WBC: 8 10*3/uL (ref 4.0–10.5)
nRBC: 0 % (ref 0.0–0.2)

## 2022-12-14 LAB — MAGNESIUM: Magnesium: 2.1 mg/dL (ref 1.7–2.4)

## 2022-12-14 LAB — PROCALCITONIN: Procalcitonin: 0.71 ng/mL

## 2022-12-14 MED ORDER — PANCRELIPASE (LIP-PROT-AMYL) 12000-38000 UNITS PO CPEP
24000.0000 [IU] | ORAL_CAPSULE | Freq: Three times a day (TID) | ORAL | Status: DC
  Administered 2022-12-14 – 2022-12-15 (×3): 24000 [IU] via ORAL
  Filled 2022-12-14 (×3): qty 2

## 2022-12-14 MED ORDER — SODIUM CHLORIDE 0.9 % IV SOLN
3.0000 g | Freq: Four times a day (QID) | INTRAVENOUS | Status: DC
  Administered 2022-12-14 – 2022-12-15 (×5): 3 g via INTRAVENOUS
  Filled 2022-12-14 (×8): qty 8

## 2022-12-14 NOTE — Progress Notes (Signed)
Patient arrived to unit at 530, oriented to room and unit.  Patient alert, oriented x 4, denies pain.

## 2022-12-14 NOTE — Consult Note (Signed)
PHARMACY CONSULT NOTE - ELECTROLYTES  Pharmacy Consult for Electrolyte Monitoring and Replacement   Recent Labs: Height: 6\' 3"  (190.5 cm) Weight: 101.5 kg (223 lb 12.3 oz) IBW/kg (Calculated) : 84.5 Estimated Creatinine Clearance: 47.3 mL/min (A) (by C-G formula based on SCr of 1.5 mg/dL (H)). Potassium (mmol/L)  Date Value  12/14/2022 4.0  04/19/2014 3.0 (L)   Magnesium (mg/dL)  Date Value  86/57/8469 2.1  04/16/2014 2.3   Calcium (mg/dL)  Date Value  62/95/2841 8.8 (L)   Calcium, Total (mg/dL)  Date Value  32/44/0102 8.5   Albumin (g/dL)  Date Value  72/53/6644 2.9 (L)  04/15/2014 2.7 (L)   Phosphorus (mg/dL)  Date Value  03/47/4259 2.8   Sodium (mmol/L)  Date Value  12/14/2022 137  04/19/2014 135 (L)   Assessment [name] H. Presutti is a 82 y.o. male admitted 12/12/2022 for hypovolemic shock/sepsis with suspected aspiration pneumonia. 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   Goal of Therapy: Electrolytes WNL  Plan:  No replacement needed.  F/u with AM labs on 9/9.   Thank you for involving pharmacy in this patient's care.   Ronnald Ramp, PharmD Clinical Pharmacist 12/14/2022 10:58 AM

## 2022-12-14 NOTE — Progress Notes (Signed)
Rounding Note    Patient Name: Scott Buckley Date of Encounter: 12/14/2022  [facility] Cardiologist:[facility]  Subjective   No family at the bedside Working with PT this morning Continues to have diarrhea, very thin this morning per PT, cleaned up required On Imodium with rectal tube in place He otherwise has no complaints Blood pressure and heart rate stable,  Blood pressure somewhat labile, systolic 120s up to 170s over the past 24 hours  Inpatient Medications    Scheduled Meds:  Chlorhexidine Gluconate Cloth  6 each Topical Daily   diclofenac Sodium  4 g Topical QID   enoxaparin (LOVENOX) injection  40 mg Subcutaneous Q24H   finasteride  5 mg Oral Daily   insulin aspart  0-15 Units Subcutaneous Q4H   lamoTRIgine  25 mg Oral Daily   lidocaine  1 patch Transdermal Q24H   lipase/protease/amylase  24,000 Units Oral TID AC   pantoprazole (PROTONIX) IV  40 mg Intravenous Q24H   rosuvastatin  20 mg Oral Daily   Continuous Infusions:  sodium chloride Stopped (12/12/22 2200)   ampicillin-sulbactam (UNASYN) IV 3 g (12/14/22 1358)   PRN Meds: acetaminophen, docusate sodium, loperamide, ondansetron (ZOFRAN) IV, polyethylene glycol   Vital Signs    Vitals:   12/14/22 1159 12/14/22 1200 12/14/22 1300 12/14/22 1400  BP: 128/64 128/64 (!) 146/73 (!) 113/56  Pulse: (!) 55 (!) 51 (!) 59 61  Resp: (!) 22 13 (!) 23 11  Temp: 99.2 F (37.3 C)     TempSrc: Axillary     SpO2: 95% 94% 98% 93%  Weight:      Height:        Intake/Output Summary (Last 24 hours) at 12/14/2022 1430 Last data filed at 12/14/2022 1300 Gross per 24 hour  Intake 3130.94 ml  Output 2085 ml  Net 1045.94 ml      12/14/2022    4:44 AM 12/13/2022    4:31 AM 12/12/2022    3:43 PM  Last 3 Weights  Weight (lbs) 223 lb 12.3 oz 223 lb 12.3 oz 220 lb  Weight (kg) 101.5 kg 101.5 kg 99.791 kg      Telemetry    Normal sinus rhythm- Personally Reviewed  ECG     - Personally Reviewed  Physical Exam    GEN: No acute distress.   Neck: No JVD Cardiac: RRR, no murmurs, rubs, or gallops.  Respiratory: Clear to auscultation bilaterally. GI: Soft, nontender, non-distended, rectal tube in place MS: No edema; No deformity. Neuro:  Nonfocal  Psych: Normal affect   Labs    High Sensitivity Troponin:   Recent Labs  Lab 12/12/22 1550 12/12/22 1804  TROPONINIHS 6 6     Chemistry Recent Labs  Lab 12/12/22 1550 12/13/22 0502 12/14/22 0435  NA 138 138 137  K 3.5 4.4 4.0  CL 103 105 103  CO2 21* 24 26  GLUCOSE 201* 171* 132*  BUN 29* 28* 27*  CREATININE 1.66* 1.36* 1.50*  CALCIUM 9.1 8.4* 8.8*  MG 2.1 1.7 2.1  PROT 6.8  --   --   ALBUMIN 3.4*  --  2.9*  AST 20  --   --   ALT 11  --   --   ALKPHOS 49  --   --   BILITOT 0.3  --   --   GFRNONAA 40* 51* 46*  ANIONGAP 14 9 8     Lipids No results for input(s): "CHOL", "TRIG", "HDL", "LABVLDL", "LDLCALC", "CHOLHDL" in the last 168  hours.  Hematology Recent Labs  Lab 12/12/22 1550 12/13/22 0502 12/14/22 0435  WBC 14.3* 12.3* 8.0  RBC 4.85 4.01* 3.46*  HGB 14.2 11.9* 10.3*  HCT 44.8 35.7* 30.9*  MCV 92.4 89.0 89.3  MCH 29.3 29.7 29.8  MCHC 31.7 33.3 33.3  RDW 14.0 14.2 14.2  PLT 231 173 156   Thyroid No results for input(s): "TSH", "FREET4" in the last 168 hours.  BNPNo results for input(s): "BNP", "PROBNP" in the last 168 hours.  DDimer No results for input(s): "DDIMER" in the last 168 hours.   Radiology    CT CHEST ABDOMEN PELVIS W CONTRAST  Result Date: 12/12/2022 CLINICAL DATA:  Fall.  Covered in vomitus. EXAM: CT CHEST, ABDOMEN, AND PELVIS WITH CONTRAST TECHNIQUE: Multidetector CT imaging of the chest, abdomen and pelvis was performed following the standard protocol during bolus administration of intravenous contrast. RADIATION DOSE REDUCTION: This exam was performed according to the departmental dose-optimization program which includes automated exposure control, adjustment of the mA and/or kV according to patient  size and/or use of iterative reconstruction technique. CONTRAST:  80mL OMNIPAQUE IOHEXOL 300 MG/ML  SOLN COMPARISON:  09/12/2022 FINDINGS: CT CHEST FINDINGS Cardiovascular: Borderline heart size. No pericardial effusion. There is atheromatous calcification of the aorta and coronaries. Mediastinum/Nodes: Negative for mass or adenopathy. No hematoma or pneumomediastinum Lungs/Pleura: Hazy airspace opacity in the dependent lungs especially affecting the lower lobes where there is right more than left airway opacification. No pleural fluid, pneumothorax, or contusion like appearance. Musculoskeletal: No acute fracture or traumatic malalignment CT ABDOMEN PELVIS FINDINGS Hepatobiliary: No hepatic injury or perihepatic hematoma. Gallbladder is unremarkable. Pancreas: No visible injury. Spleen: Low-density fluid around the spleen. No visible laceration or contusion Adrenals/Urinary Tract: Right hydronephrosis without visible obstructing process. Clips are seen around the right kidney and ureter. Thick walled bladder circumferentially suggesting chronic outlet obstruction Stomach/Bowel: No specific signs of injury. Vascular/Lymphatic: Aorta bi-iliac stenting which is diffusely patent. No evidence of injury or adenopathy Reproductive: Enlarged prostate with calcifications and markers, largely obscured due to streak artifact from a left hip prosthesis. Other: Low-density ascites in the pelvis. No pneumoperitoneum. Lax abdominal wall. Musculoskeletal: Left hip prosthesis which is located. Embolization coils at the left sciatic notch. Severe right hip osteoarthritis. Intrinsic back muscle show diffuse atrophy. Advanced lumbar spine degeneration with mild scoliosis and L4-5 anterolisthesis. IMPRESSION: 1. Dependent airspace disease, pattern and history suggesting aspiration. 2. No definitive signs of trauma. There is peritoneal fluid around the spleen and in the pelvis but low-density and without visible visceral injury. Suggest  abdominal exam follow-up. 3. Right hydroureteronephrosis without visible cause. Symmetric renal enhancement and excretion. 4. Chronic findings are stable and described above. Electronically Signed   By: Tiburcio Pea M.D.   On: 12/12/2022 19:01   CT Head Wo Contrast  Result Date: 12/12/2022 CLINICAL DATA:  Head trauma, minor (Age >= 65y); Neck trauma (Age >= 65y) EXAM: CT HEAD WITHOUT CONTRAST CT CERVICAL SPINE WITHOUT CONTRAST TECHNIQUE: Multidetector CT imaging of the head and cervical spine was performed following the standard protocol without intravenous contrast. Multiplanar CT image reconstructions of the cervical spine were also generated. RADIATION DOSE REDUCTION: This exam was performed according to the departmental dose-optimization program which includes automated exposure control, adjustment of the mA and/or kV according to patient size and/or use of iterative reconstruction technique. COMPARISON:  CT Head 08/15/17 FINDINGS: CT HEAD FINDINGS Brain: No hemorrhage. No hydrocephalus. No extra-axial fluid collection. No CT evidence of an acute cortical infarct. 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 This has increased in size compared to 2019 at which time it measured up to 2.5 x 2.0 x 2.1 cm. There is likely also fusiform dilatation of the M1 segment of the left MCA. Skull: Normal. Negative for fracture or focal lesion. Sinuses/Orbits: 2 no middle ear or mastoid effusion. Paranasal sinuses are clear. Bilateral lens replacement. Orbits are otherwise unremarkable. Other: None. CT CERVICAL SPINE FINDINGS Alignment: Trace anterolisthesis of C2 on C3 and retrolisthesis of C3 on C4. Trace anterolisthesis of C4 on C5. Skull base and vertebrae: No acute fracture. No primary bone lesion or focal pathologic process. Soft tissues and spinal canal: No prevertebral fluid or swelling. No visible canal hematoma. Disc levels:  No evidence of high-grade spinal canal  stenosis Upper chest: Negative. Other: None IMPRESSION: 1. No acute intracranial abnormality. 2. No acute fracture or traumatic subluxation of the cervical spine. 3. 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 This has increased in size compared to 2019 at which time it measured up to 2.5 x 2.0 x 2.1 Cm. There is likely also fusiform dilatation of the M1 segment of the left MCA. Recommend further evaluation with CTA. Neurosurgical consultation is also recommended, if this was not previously performed. Electronically Signed   By: Lorenza Cambridge M.D.   On: 12/12/2022 18:44   CT Cervical Spine Wo Contrast  Result Date: 12/12/2022 CLINICAL DATA:  Head trauma, minor (Age >= 65y); Neck trauma (Age >= 65y) EXAM: CT HEAD WITHOUT CONTRAST CT CERVICAL SPINE WITHOUT CONTRAST TECHNIQUE: Multidetector CT imaging of the head and cervical spine was performed following the standard protocol without intravenous contrast. Multiplanar CT image reconstructions of the cervical spine were also generated. RADIATION DOSE REDUCTION: This exam was performed according to the departmental dose-optimization program which includes automated exposure control, adjustment of the mA and/or kV according to patient size and/or use of iterative reconstruction technique. COMPARISON:  CT Head 08/15/17 FINDINGS: CT HEAD FINDINGS Brain: No hemorrhage. No hydrocephalus. No extra-axial fluid collection. No CT evidence of an acute cortical infarct. 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 This has increased in size compared to 2019 at which time it measured up to 2.5 x 2.0 x 2.1 cm. There is likely also fusiform dilatation of the M1 segment of the left MCA. Skull: Normal. Negative for fracture or focal lesion. Sinuses/Orbits: 2 no middle ear or mastoid effusion. Paranasal sinuses are clear. Bilateral lens replacement. Orbits are otherwise unremarkable. Other: None. CT CERVICAL  SPINE FINDINGS Alignment: Trace anterolisthesis of C2 on C3 and retrolisthesis of C3 on C4. Trace anterolisthesis of C4 on C5. Skull base and vertebrae: No acute fracture. No primary bone lesion or focal pathologic process. Soft tissues and spinal canal: No prevertebral fluid or swelling. No visible canal hematoma. Disc levels:  No evidence of high-grade spinal canal stenosis Upper chest: Negative. Other: None IMPRESSION: 1. No acute intracranial abnormality. 2. No acute fracture or traumatic subluxation of the cervical spine. 3. 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 This has increased in size compared to 2019 at which time it measured up to 2.5 x 2.0 x 2.1 Cm. There is likely also fusiform dilatation of the M1 segment of the left MCA. Recommend further evaluation with CTA. Neurosurgical consultation is also recommended, if this was not  previously performed. Electronically Signed   By: Lorenza Cambridge M.D.   On: 12/12/2022 18:44    Cardiac Studies   Echo pending  Patient Profile     82 y.o. 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 difficile negative x 1 On Imodium   Wide-complex rhythm/left bundle Noted in the emergency room, short runs In the setting of hypotension, diarrhea/gastroenteritis, sepsis not  clearly associated with hypotensive episodes No indication at this time for temporary wire/permanent pacing Would continue to closely monitor magnesium and potassium levels and replete as needed Prior echo with normal LV function, repeat study pending -Zio monitor at discharge to determine if continued paroxysmal arrhythmia   Chronic renal failure Renal dysfunction is somewhat labile over the past year, likely worse in the setting of diarrhea, prerenal state   Diabetes type 2 Management per medicine team   Essential hypertension On lisinopril, hydrochlorothiazide as outpatient, currently on hold At discharge consider holding HCTZ given frequent episodes of hypovolemia, change to amlodipine if needed   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   Medication Recommendations: No additional medication changes Other recommendations (labs, testing, etc): No further testing needed Follow up as an outpatient: At the time of discharge, Zio monitor could be arranged    Total encounter time more than 50 minutes  Greater than 50% was spent in counseling and coordination of care with the patient   For questions or updates, please contact [facility] Please consult [email or web address] for contact info under        Signed, Julien Nordmann, MD  12/14/2022, 2:30 PM

## 2022-12-14 NOTE — Progress Notes (Signed)
Report called to [facility], receiving nurse for room 126

## 2022-12-14 NOTE — Progress Notes (Signed)
Pt transported to room 126 via bed accompanied by this RN and NT, Thayer Ohm; on telemetry

## 2022-12-14 NOTE — Progress Notes (Signed)
Progress Note   Patient: Scott Buckley VOZ:[id number] DOB: Feb 05, 1941 DOA: 12/12/2022     2 DOS: the patient was seen and examined on 12/14/2022   Brief hospital course: Scott Buckley is a 82 year old male with history of essential hypertension, type 2 diabetes, dyslipidemia, chronic kidney disease stage IIIa, who came to the hospital with nausea vomiting, was found to have aspiration pneumonia.  He also had a history of pancreatic insufficiency, has chronic diarrhea. He had a syncope prior to the hospitalization, he was found to have hypovolemic shock.  He was given IV fluids and pressor.  Chest x-ray also showed bilateral lower lobe aspiration pneumonia.  He was placed on broad-spectrum antibiotics. Condition has improved, transfer out of ICU on 9/6.   Principal Problem:   Sepsis due to pneumonia [facility]) Active Problems:   Vasovagal syncopes   Chronic renal failure (CRF), stage 3b (HCC)   Functional diarrhea   Hypotension due to hypovolemia   PAD (peripheral artery disease) (HCC)   Wide-complex tachycardia   Syncope and collapse   Shock (HCC)   Pancreatic insufficiency   Assessment and Plan: Syncope and collapse. Hypovolemic shock. Nausea vomiting and diarrhea with chronic pancreatic sufficiency. Discussed with patient daughter, patient has chronic diarrhea, at the time of admission, he also developed nausea vomiting, which has caused syncope due to dehydration.  Patient will initially was placed on pressors, condition had improved. Patient also has chronic pancreatic insufficiency, the source of chronic diarrhea.  Will placed on pancreatic enzymes chronically. Patient condition had improved, I will discontinue IV fluids.  Bilateral lower lobe aspiration pneumonia Sepsis ruled out. Reviewed the chart, patient does not meet sepsis criteria.  I initial imaging study, patient has bilateral lower lobe aspiration pneumonia, he also has mild elevation of procalcitonin level.  He does not  have a choking episode or dysphagia.  Aspiration pneumonia.  To be secondary to nausea vomiting. He is currently on Zosyn, will change to Unasyn with total antibiotic course of 5 days.  Plan to change to Augmentin at time of discharge.  Chronic kidney disease stage IIIa. Mild metabolic acidosis. Acute kidney injury ruled out. Reviewed prior labs, patient does not have acute kidney injury.  Renal function was started higher time admission, had improved to baseline.  Metabolic acidosis also corrected.  Type 2 diabetes with hyperglycemia. A1c 6.4, controlled.  Increased glucose due to stress.  Continue sliding scale insulin.  Pressure ulcers. POA Followed by RN. Pressure Injury 12/12/22 Buttocks Right Deep Tissue Pressure Injury - Purple or maroon localized area of discolored intact skin or blood-filled blister due to damage of underlying soft tissue from pressure and/or shear. (Active)  12/12/22 2200  Location: Buttocks  Location Orientation: Right  Staging: Deep Tissue Pressure Injury - Purple or maroon localized area of discolored intact skin or blood-filled blister due to damage of underlying soft tissue from pressure and/or shear.  Wound Description (Comments):   Present on Admission: Yes     Pressure Injury 12/12/22 Buttocks Left Deep Tissue Pressure Injury - Purple or maroon localized area of discolored intact skin or blood-filled blister due to damage of underlying soft tissue from pressure and/or shear. 2 holes noted, upper 1x1, lower 1x. (Active)  12/12/22 2200  Location: Buttocks  Location Orientation: Left  Staging: Deep Tissue Pressure Injury - Purple or maroon localized area of discolored intact skin or blood-filled blister due to damage of underlying soft tissue from pressure and/or shear.  Wound Description (Comments): 2 holes noted, upper 1x1, lower  1x.5  Present on Admission: Yes     Pressure Injury 12/12/22 Thigh Posterior;Proximal;Right Deep Tissue Pressure Injury -  Purple or maroon localized area of discolored intact skin or blood-filled blister due to damage of underlying soft tissue from pressure and/or shear. (Active)  12/12/22 2200  Location: Thigh  Location Orientation: Posterior;Proximal;Right  Staging: Deep Tissue Pressure Injury - Purple or maroon localized area of discolored intact skin or blood-filled blister due to damage of underlying soft tissue from pressure and/or shear.  Wound Description (Comments):   Present on Admission: Yes         Subjective:  Patient condition much improved today, denies any short of breath.  Still have a cough, with increased mucus.  Physical Exam: Vitals:   12/14/22 0900 12/14/22 1000 12/14/22 1100 12/14/22 1159  BP: (!) 156/74 107/67 123/65   Pulse: (!) 57 62 63   Resp: 13 12 (!) 24   Temp:    99.2 F (37.3 C)  TempSrc:    Axillary  SpO2: 97% 95% 95%   Weight:      Height:       General exam: Appears calm and comfortable  Respiratory system: Clear to auscultation. Respiratory effort normal. Cardiovascular system: S1 & S2 heard, RRR. No JVD, murmurs, rubs, gallops or clicks. No pedal edema. Gastrointestinal system: Abdomen is nondistended, soft and nontender. No organomegaly or masses felt. Normal bowel sounds heard. Central nervous system: Alert and oriented. No focal neurological deficits. Extremities: Symmetric 5 x 5 power. Skin: No rashes, lesions or ulcers Psychiatry: Judgement and insight appear normal. Mood & affect appropriate.    Data Reviewed:  Reviewed CT chest and abdomen pelvis results, CT cervical spine and CT head results.  Lab results.  Family Communication: Daughter at the bedside.  Disposition: Status is: Inpatient Remains inpatient appropriate because: Severity of disease, IV antibiotics.  Also pending nursing home placement.     Time spent: 55 minutes  Author: Marrion Coy, MD 12/14/2022 1:39 PM  For on call review [email or web address].

## 2022-12-14 NOTE — Evaluation (Signed)
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  Admitted for management of hypovolemic shock and sepsis related to suspected aspiration PNA.  Clinical Impression  Patient resting in bed upon arrival to room; alert and oriented, follows commands and agreeable to participation with session (with min encouragement from therapist).  Endorses generalized soreness to buttocks, especially with transitional movements; does appear to resolve with accommodation to position.  Bilat UE/LE strength and ROM grossly symmetrical and WFL for basic transfers and gait; no significant, focal weakness appreciated.  Currently requiring mod assist for bed mobility and scooting towards edge of bed; min/mod assist +2 for sit/stand, standing balance and bed/chair transfer with RW.  Requires cuing for hand placement; assist for lift off, standing balance. Very short, shuffling steps between seating surfaces; forward flexed posture. Limited balance and overall activity tolerance.  Will continue to assess/progress in subsequent sessions as medically appropriate. Would benefit from skilled PT to address above deficits and promote optimal return to PLOF.; recommend post-acute PT follow up as indicated by interdisciplinary care team.          If plan is discharge home, recommend the following: Two people to help with walking and/or transfers;Two people to help with bathing/dressing/bathroom   Can travel by private vehicle   Yes    Equipment Recommendations  (has RW)  Recommendations for Other Services       Functional Status Assessment Patient has had a recent decline in  their functional status and demonstrates the ability to make significant improvements in function in a reasonable and predictable amount of time.     Precautions / Restrictions Precautions Precautions: Fall Precaution Comments: foley, rectal tube Restrictions Weight Bearing Restrictions: No      Mobility  Bed Mobility Overal bed mobility: Needs Assistance Bed Mobility: Supine to Sit     Supine to sit: Mod assist          Transfers Overall transfer level: Needs assistance Equipment used: Rolling walker (2 wheels) Transfers: Sit to/from Stand, Bed to chair/wheelchair/BSC Sit to Stand: Min assist, Mod assist, +2 safety/equipment Stand pivot transfers: Min assist, Mod assist, +2 safety/equipment         General transfer comment: cuing for hand placement; assist for lift off, standing balance.  Very short, shuffling steps between seating surfaces; forward flexed posture.  Limited balance and overall activity tolerance    Ambulation/Gait               General Gait Details: deferred due to generalized fatigue  Stairs            Wheelchair Mobility     Tilt Bed    Modified Rankin (Stroke Patients Only)       Balance Overall balance assessment: Needs assistance Sitting-balance support: No upper extremity supported, Feet supported Sitting balance-Leahy Scale: Good     Standing balance support: Bilateral upper extremity supported Standing balance-Leahy Scale: Fair                               Pertinent Vitals/Pain Pain Assessment Pain Assessment: Faces Faces Pain Scale: Hurts little more Pain Location: buttocks Pain Descriptors / Indicators: Aching, Grimacing Pain Intervention(s): Limited  activity within patient's tolerance, Monitored during session, Repositioned    Home Living Family/patient expects to be discharged to:: Private residence Living Arrangements: Spouse/significant other;Other relatives Available Help at Discharge:  Family;Available 24 hours/day Type of Home: House Home Access: Stairs to enter Entrance Stairs-Rails: Right Entrance Stairs-Number of Steps: 1   Home Layout: Two level;Able to live on main level with bedroom/bathroom Home Equipment: Rolling Walker (2 wheels);Cane - single point;BSC/3in1;Shower seat;Grab bars - tub/shower      Prior Function Prior Level of Function : Independent/Modified Independent             Mobility Comments: Mod Ind amb with a RW in the home and a SPC in the community, able to amb community distances, does endorse previous fall history, but none in previous six months ADLs Comments: Occasional min A with donning socks and drying back after bathing     Extremity/Trunk Assessment   Upper Extremity Assessment Upper Extremity Assessment: Overall WFL for tasks assessed    Lower Extremity Assessment Lower Extremity Assessment: Overall WFL for tasks assessed (grossly 3+ to 4-/5 throughout)       Communication      Cognition Arousal: Alert Behavior During Therapy: [facility] for tasks assessed/performed Overall Cognitive Status: Within Functional Limits for tasks assessed                                          General Comments      Exercises     Assessment/Plan    PT Assessment Patient needs continued PT services  PT Problem List Decreased strength;Decreased range of motion;Decreased activity tolerance;Decreased balance;Decreased mobility;Decreased knowledge of use of DME;Decreased safety awareness;Decreased knowledge of precautions;Cardiopulmonary status limiting activity       PT Treatment Interventions DME instruction;Gait training;Stair training;Functional mobility training;Therapeutic activities;Therapeutic exercise;Balance training;Cognitive remediation;Patient/family education    PT Goals (Current goals can be found in the Care Plan section)  Acute Rehab PT Goals Patient Stated Goal: to return home PT Goal Formulation: With  patient Time For Goal Achievement: 12/28/22 Potential to Achieve Goals: Good    Frequency Min 1X/week     Co-evaluation PT/OT/SLP Co-Evaluation/Treatment: Yes Reason for Co-Treatment: Complexity of the patient's impairments (multi-system involvement);To address functional/ADL transfers PT goals addressed during session: Mobility/safety with mobility OT goals addressed during session: ADL's and self-care       AM-PAC PT "6 Clicks" Mobility  Outcome Measure Help needed turning from your back to your side while in a flat bed without using bedrails?: A Little Help needed moving from lying on your back to sitting on the side of a flat bed without using bedrails?: A Lot Help needed moving to and from a bed to a chair (including a wheelchair)?: A Lot Help needed standing up from a chair using your arms (e.g., wheelchair or bedside chair)?: A Lot Help needed to walk in hospital room?: A Lot Help needed climbing 3-5 steps with a railing? : A Lot 6 Click Score: 13    End of Session   Activity Tolerance: Patient tolerated treatment well Patient left: in chair;with call bell/phone within reach Nurse Communication: Mobility status PT Visit Diagnosis: Muscle weakness (generalized) (M62.81);Difficulty in walking, not elsewhere classified (R26.2)    Time: 4098-1191 PT Time Calculation (min) (ACUTE ONLY): 36 min   Charges:   PT Evaluation $PT Eval Moderate Complexity: 1 Mod   PT General Charges $$ ACUTE PT VISIT: 1  Visit        Sejal Cofield H. Manson Passey, PT, DPT, NCS 12/14/22, 10:50 AM [phone]

## 2022-12-14 NOTE — Progress Notes (Signed)
Attempted Echo. Patient in chair. Nurse said to come back after my next Echo and patient would be in bed. Came back and patient still in chair.    Dondra Prader RVT RCS

## 2022-12-14 NOTE — Evaluation (Signed)
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  Admitted for management of hypovolemic shock and sepsis related to suspected aspiration PNA.   Clinical Impression   Pt was seen for OT evaluation this date and co-tx with PT to optimize safety and ADL/mobility efforts. Prior to hospital admission, pt was requiring assist for donning socks and drying his back after bathing from his spouse. Pt was ambulating with a RW and endorses a few falls. Pt presents to acute OT demonstrating impaired ADL performance and functional mobility 2/2 decreased activity tolerance, balance, and mild BLE strength deficits (See OT problem list for additional functional deficits). Pt currently requires MOD A for bed mobility, MIN-MOD A +2 for STS and step pivot to recliner with RW. MAX A for LB ADL Tasks. Pt would benefit from skilled OT services to address noted impairments and functional limitations (see below for any additional details) in order to maximize safety and independence while minimizing falls risk and caregiver burden.     If plan is discharge home, recommend the following: Two people to help with walking and/or transfers;A lot of help with bathing/dressing/bathroom;Assist for transportation;Help with stairs or ramp for entrance;Assistance with cooking/housework    Functional Status Assessment  Patient has had a recent decline in their functional status and demonstrates the ability to make significant improvements in function in a reasonable and predictable amount of time.  Equipment Recommendations  None recommended by OT    Recommendations for Other  Services       Precautions / Restrictions Precautions Precautions: Fall Precaution Comments: foley, rectal tube Restrictions Weight Bearing Restrictions: No      Mobility Bed Mobility Overal bed mobility: Needs Assistance Bed Mobility: Supine to Sit     Supine to sit: Mod assist          Transfers Overall transfer level: Needs assistance Equipment used: Rolling walker (2 wheels) Transfers: Sit to/from Stand, Bed to chair/wheelchair/BSC Sit to Stand: Min assist, Mod assist, +2 safety/equipment Stand pivot transfers: Min assist, Mod assist, +2 safety/equipment         General transfer comment: cuing for hand placement; assist for lift off, standing balance.  Very short, shuffling steps between seating surfaces; forward flexed posture.  Limited balance and overall activity tolerance      Balance Overall balance assessment: Needs assistance Sitting-balance support: No upper extremity supported, Feet supported Sitting balance-Leahy Scale: Good     Standing balance support: Bilateral upper extremity supported Standing balance-Leahy Scale: Fair                             ADL either performed or assessed with clinical judgement   ADL Overall ADL's : Needs assistance/impaired             Lower Body Bathing: Moderate assistance       Lower Body Dressing: Moderate assistance;Maximal assistance;Sit to/from stand   Toilet Transfer: Minimal assistance;Moderate assistance;BSC/3in1;Rolling walker (2 wheels);+2 for physical assistance;Stand-pivot   Toileting- Clothing Manipulation and Hygiene: Maximal assistance;Bed level Toileting - Clothing Manipulation Details (indicate cue type and reason): rectal tube malfunctioned, required MAX A for pericare from bed level  Vision         Perception         Praxis         Pertinent Vitals/Pain Pain Assessment Pain Assessment: Faces Faces Pain Scale: Hurts little more Pain Location:  buttocks Pain Descriptors / Indicators: Aching, Grimacing Pain Intervention(s): Limited activity within patient's tolerance, Monitored during session, Repositioned     Extremity/Trunk Assessment Upper Extremity Assessment Upper Extremity Assessment: Overall WFL for tasks assessed   Lower Extremity Assessment Lower Extremity Assessment: Overall WFL for tasks assessed;Generalized weakness       Communication     Cognition Arousal: Alert Behavior During Therapy: WFL for tasks assessed/performed Overall Cognitive Status: Within Functional Limits for tasks assessed                                       General Comments  VSS    Exercises     Shoulder Instructions      Home Living Family/patient expects to be discharged to:: Private residence Living Arrangements: Spouse/significant other;Other relatives Available Help at Discharge: Family;Available 24 hours/day Type of Home: House Home Access: Stairs to enter [employer] of Steps: 1 Entrance Stairs-Rails: Right Home Layout: Two level;Able to live on main level with bedroom/bathroom     Bathroom Shower/Tub: Arts development officer Toilet: Handicapped height     Home Equipment: Agricultural consultant (2 wheels);Cane - single point;BSC/3in1;Grab bars - tub/shower;Shower seat - built in          Prior Functioning/Environment Prior Level of Function : Independent/Modified Independent             Mobility Comments: Mod Ind amb with a RW in the home and a SPC in the community, able to amb community distances, does endorse previous fall history, but none in previous six months ADLs Comments: Occasional min A with donning socks and drying back after bathing        OT Problem List: Decreased strength;Pain;Decreased activity tolerance;Decreased safety awareness;Impaired balance (sitting and/or standing);Decreased knowledge of use of DME or AE      OT Treatment/Interventions: Self-care/ADL  training;Therapeutic exercise;Therapeutic activities;DME and/or AE instruction;Energy conservation;Patient/family education;Balance training    OT Goals(Current goals can be found in the care plan section) Acute Rehab OT Goals Patient Stated Goal: get better OT Goal Formulation: With patient Time For Goal Achievement: 12/28/22 Potential to Achieve Goals: Good ADL Goals Pt Will Perform Lower Body Dressing: with min assist;sit to/from stand Pt Will Transfer to Toilet: with min assist;bedside commode;stand pivot transfer (LRAD) Pt Will Perform Toileting - Clothing Manipulation and hygiene: sitting/lateral leans;with set-up;with supervision Additional ADL Goal #1: Pt will verbalize plan to implement at least 2 learned falls prevention/ECS to incorporate into daily ADL routines to maximize safety and independence.  OT Frequency: Min 1X/week    Co-evaluation PT/OT/SLP Co-Evaluation/Treatment: Yes Reason for Co-Treatment: Complexity of the patient's impairments (multi-system involvement);To address functional/ADL transfers PT goals addressed during session: Mobility/safety with mobility OT goals addressed during session: ADL's and self-care      AM-PAC OT "6 Clicks" Daily Activity     Outcome Measure Help from another person eating meals?: None Help from another person taking care of personal grooming?: A Little Help from another person toileting, which includes using toliet, bedpan, or urinal?: A Lot Help from another person bathing (including washing, rinsing, drying)?: A Lot Help from another person to put on and taking off regular upper  body clothing?: A Little Help from another person to put on and taking off regular lower body clothing?: A Lot 6 Click Score: 16   End of Session Equipment Utilized During Treatment: Rolling walker (2 wheels) Nurse Communication: Mobility status  Activity Tolerance: Patient tolerated treatment well Patient left: in chair;with call bell/phone within  reach  OT Visit Diagnosis: Other abnormalities of gait and mobility (R26.89);History of falling (Z91.81)                Time: 1191-4782 OT Time Calculation (min): 32 min Charges:  OT General Charges $OT Visit: 1 Visit OT Evaluation $OT Eval Moderate Complexity: 1 Mod  Arman Filter., MPH, MS, OTR/L ascom [phone] 12/14/22, 2:14 PM

## 2022-12-14 NOTE — Plan of Care (Signed)

## 2022-12-14 NOTE — Hospital Course (Signed)
Scott Buckley is a 82 year old male with history of essential hypertension, type 2 diabetes, dyslipidemia, chronic kidney disease stage IIIa, who came to the hospital with nausea vomiting, was found to have aspiration pneumonia.  He also had a history of pancreatic insufficiency, has chronic diarrhea. He had a syncope prior to the hospitalization, he was found to have hypovolemic shock.  He was given IV fluids and pressor.  Chest x-ray also showed bilateral lower lobe aspiration pneumonia.  He was placed on broad-spectrum antibiotics. Condition has improved, transfer out of ICU on 9/6. Patient condition is improving, antibiotic changed to oral on 9/8.  Patient condition had improved, antibiotics completed today. Patient also had a significant diarrhea due to pancreatic insufficiency.  He has been treated with higher dose of Creon, Imodium, Florastor.  Diarrhea much better.  Patient is medically stable to be discharged.

## 2022-12-15 DIAGNOSIS — J690 Pneumonitis due to inhalation of food and vomit: Secondary | ICD-10-CM | POA: Diagnosis not present

## 2022-12-15 DIAGNOSIS — R571 Hypovolemic shock: Secondary | ICD-10-CM | POA: Diagnosis not present

## 2022-12-15 DIAGNOSIS — K8689 Other specified diseases of pancreas: Secondary | ICD-10-CM | POA: Diagnosis not present

## 2022-12-15 LAB — GLUCOSE, CAPILLARY
Glucose-Capillary: 102 mg/dL — ABNORMAL HIGH (ref 70–99)
Glucose-Capillary: 120 mg/dL — ABNORMAL HIGH (ref 70–99)
Glucose-Capillary: 134 mg/dL — ABNORMAL HIGH (ref 70–99)
Glucose-Capillary: 154 mg/dL — ABNORMAL HIGH (ref 70–99)
Glucose-Capillary: 93 mg/dL (ref 70–99)
Glucose-Capillary: 98 mg/dL (ref 70–99)
Glucose-Capillary: 99 mg/dL (ref 70–99)

## 2022-12-15 MED ORDER — SACCHAROMYCES BOULARDII 250 MG PO CAPS
250.0000 mg | ORAL_CAPSULE | Freq: Two times a day (BID) | ORAL | Status: DC
  Administered 2022-12-15 – 2022-12-17 (×4): 250 mg via ORAL
  Filled 2022-12-15 (×4): qty 1

## 2022-12-15 MED ORDER — LACTATED RINGERS IV SOLN: INTRAVENOUS | Status: DC

## 2022-12-15 MED ORDER — LOPERAMIDE HCL 2 MG PO CAPS
2.0000 mg | ORAL_CAPSULE | Freq: Two times a day (BID) | ORAL | Status: AC
  Administered 2022-12-16 (×2): 2 mg via ORAL
  Filled 2022-12-15 (×2): qty 1

## 2022-12-15 MED ORDER — LOPERAMIDE HCL 2 MG PO CAPS
2.0000 mg | ORAL_CAPSULE | Freq: Two times a day (BID) | ORAL | Status: AC
  Administered 2022-12-15 (×2): 2 mg via ORAL
  Filled 2022-12-15 (×2): qty 1

## 2022-12-15 MED ORDER — TAMSULOSIN HCL 0.4 MG PO CAPS
0.4000 mg | ORAL_CAPSULE | Freq: Every day | ORAL | Status: DC
  Administered 2022-12-15 – 2022-12-17 (×3): 0.4 mg via ORAL
  Filled 2022-12-15 (×3): qty 1

## 2022-12-15 MED ORDER — AMOXICILLIN-POT CLAVULANATE 875-125 MG PO TABS
1.0000 | ORAL_TABLET | Freq: Two times a day (BID) | ORAL | Status: DC
  Administered 2022-12-15 – 2022-12-17 (×4): 1 via ORAL
  Filled 2022-12-15 (×4): qty 1

## 2022-12-15 MED ORDER — PANCRELIPASE (LIP-PROT-AMYL) 12000-38000 UNITS PO CPEP
36000.0000 [IU] | ORAL_CAPSULE | Freq: Three times a day (TID) | ORAL | Status: DC
  Administered 2022-12-15 – 2022-12-17 (×6): 36000 [IU] via ORAL
  Filled 2022-12-15 (×6): qty 3

## 2022-12-15 NOTE — TOC Progression Note (Signed)
Transition of Care [facility]) - Progression Note    Patient Details  Name: Scott Buckley MRN: [id number] Date of Birth: 02-26-1941  Transition of Care [facility]) CM/SW Contact  Liliana Cline, LCSW Phone Number: 12/15/2022, 12:49 PM  Clinical Narrative:    Met with patient at bedside to discuss therapy's rec for SNF. Patient declines SNF. He would like to go home and resume [facility] services at time of DC. Patient states his wife and son will provide support at home and he feels comfortable returning home instead of going to short term rehab.      Barriers to Discharge: Continued Medical Work up  Expected Discharge Plan and Services     Post Acute Care Choice: Home Health, Skilled Nursing Facility Living arrangements for the past 2 months: Single Family Home                                       Social Determinants of Health (SDOH) Interventions SDOH Screenings   Food Insecurity: No Food Insecurity (08/12/2022)   Received from [facility], [facility]  Housing: Low Risk  (05/20/2022)  Transportation Needs: No Transportation Needs (08/12/2022)   Received from [facility], [facility]  Utilities: Not At Risk (05/20/2022)  Depression (PHQ2-9): Low Risk  (05/10/2019)  Financial Resource Strain: Low Risk  (08/12/2022)   Received from [facility], [facility]  Tobacco Use: High Risk (12/12/2022)    Readmission Risk Interventions    12/13/2022    8:51 AM  Readmission Risk Prevention Plan  Transportation Screening Complete  PCP or Specialist Appt within 3-5 Days Not Complete  Not Complete comments in icu still inpt  HRI or Home Care Consult Complete  Social Work Consult for Recovery Care Planning/Counseling Complete  Palliative Care Screening Not Applicable  Medication Review Oceanographer) Referral to Pharmacy

## 2022-12-15 NOTE — Progress Notes (Addendum)
Progress Note   Patient: Scott Buckley ZOX:[id number] DOB: 04/09/1940 DOA: 12/12/2022     3 DOS: the patient was seen and examined on 12/15/2022   Brief hospital course: Meba Pezzi is a 82 year old male with history of essential hypertension, type 2 diabetes, dyslipidemia, chronic kidney disease stage IIIa, who came to the hospital with nausea vomiting, was found to have aspiration pneumonia.  He also had a history of pancreatic insufficiency, has chronic diarrhea. He had a syncope prior to the hospitalization, he was found to have hypovolemic shock.  He was given IV fluids and pressor.  Chest x-ray also showed bilateral lower lobe aspiration pneumonia.  He was placed on broad-spectrum antibiotics. Condition has improved, transfer out of ICU on 9/6.   Active Problems:   Septic shock (HCC)   Vasovagal syncopes   CKD stage 3a, GFR 45-59 ml/min (HCC)   Functional diarrhea   Hypotension due to hypovolemia   PAD (peripheral artery disease) (HCC)   Wide-complex tachycardia   Syncope and collapse   Hypovolemic shock (HCC)   Pancreatic insufficiency   Aspiration pneumonia of both lower lobes (HCC)   Acute respiratory failure with hypoxia (HCC)   Aspiration into airway   Assessment and Plan: Syncope and collapse. Hypovolemic shock. Nausea vomiting and diarrhea with chronic pancreatic sufficiency. Discussed with patient daughter, patient has chronic diarrhea, at the time of admission, he also developed nausea vomiting, which has caused syncope due to dehydration.  Patient will initially was placed on pressors, condition had improved. Patient also has chronic pancreatic insufficiency, the source of chronic diarrhea.  Placed on pancreatic enzymes chronically. Condition has improved, minimal out of from a rectal tube overnight.  Removed rectal tube today.   Bilateral lower lobe aspiration pneumonia Sepsis ruled out. Acute hypoxemic respiratory failure ruled out. Reviewed the chart, patient  does not meet sepsis criteria.  I initial imaging study, patient has bilateral lower lobe aspiration pneumonia, he also has mild elevation of procalcitonin level.  He does not have a choking episode or dysphagia.  Aspiration pneumonia.  To be secondary to nausea vomiting. He was currently on Zosyn, changed to Unasyn with total antibiotic course of 5 days.  Respiratory status improving, with consistent diarrhea, changed abx to augmentin at evening of 9/8 Reviewed chart, patient does not have documented hypoxemia.   Chronic kidney disease stage IIIa. Mild metabolic acidosis. 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 Acute kidney injury ruled out. Reviewed prior labs, patient does not have acute kidney injury.  Renal function was started higher time admission, had improved to baseline.  It appears that the patient had a urinary retention time of admission, added Flomax.  Consider remove catheter in 1 to 2 days.   Type 2 diabetes with hyperglycemia. A1c 6.4, controlled.  Increased glucose due to stress.  Continue sliding scale insulin.   Pressure ulcers. POA Followed by RN. Pressure Injury 12/12/22 Buttocks Right Deep Tissue Pressure Injury - Purple or maroon localized area of discolored intact skin or blood-filled blister due to damage of underlying soft tissue from pressure and/or shear. (Active)  12/12/22 2200  Location: Buttocks  Location Orientation: Right  Staging: Deep Tissue Pressure Injury - Purple or maroon localized area of discolored intact skin or blood-filled blister due to damage of underlying soft tissue from pressure and/or shear.  Wound Description (Comments):   Present on Admission: Yes     Pressure Injury 12/12/22 Buttocks Left Deep Tissue Pressure Injury - Purple or maroon localized area of discolored intact  skin or blood-filled blister due to damage of underlying soft tissue from pressure and/or shear. 2 holes noted, upper 1x1, lower 1x. (Active)  12/12/22  2200  Location: Buttocks  Location Orientation: Left  Staging: Deep Tissue Pressure Injury - Purple or maroon localized area of discolored intact skin or blood-filled blister due to damage of underlying soft tissue from pressure and/or shear.  Wound Description (Comments): 2 holes noted, upper 1x1, lower 1x.5  Present on Admission: Yes     Pressure Injury 12/12/22 Thigh Posterior;Proximal;Right Deep Tissue Pressure Injury - Purple or maroon localized area of discolored intact skin or blood-filled blister due to damage of underlying soft tissue from pressure and/or shear. (Active)  12/12/22 2200  Location: Thigh  Location Orientation: Posterior;Proximal;Right  Staging: Deep Tissue Pressure Injury - Purple or maroon localized area of discolored intact skin or blood-filled blister due to damage of underlying soft tissue from pressure and/or shear.  Wound Description (Comments):   Present on Admission: Yes            Subjective:  Patient doing better today, good appetite.  Diarrhea is slowing down.  Physical Exam: Vitals:   12/15/22 0031 12/15/22 0500 12/15/22 0525 12/15/22 0924  BP: (!) 148/83  (!) 190/89 (!) 189/89  Pulse: (!) 56  (!) 58 (!) 56  Resp: 16  16 16   Temp: 98.7 F (37.1 C)  98.9 F (37.2 C) 98.4 F (36.9 C)  TempSrc: Oral  Oral   SpO2: 96%  96% 97%  Weight:  102.3 kg    Height:       General exam: Appears calm and comfortable  Respiratory system: Clear to auscultation. Respiratory effort normal. Cardiovascular system: S1 & S2 heard, RRR. No JVD, murmurs, rubs, gallops or clicks. No pedal edema. Gastrointestinal system: Abdomen is nondistended, soft and nontender. No organomegaly or masses felt. Normal bowel sounds heard. Central nervous system: Alert and oriented. No focal neurological deficits. Extremities: Symmetric 5 x 5 power. Skin: No rashes, lesions or ulcers Psychiatry: Judgement and insight appear normal. Mood & affect appropriate.    Data  Reviewed:  Reviewed lab results.  Family Communication: Daughter and wife updated at the bedside.  Disposition: Status is: Inpatient Remains inpatient appropriate because: Severity of disease, IV treatment. Pending nursing home placement.     Time spent: 35 minutes  Author: Marrion Coy, MD 12/15/2022 1:58 PM  For on call review [email or web address].

## 2022-12-15 NOTE — Plan of Care (Signed)

## 2022-12-15 NOTE — Consult Note (Signed)
PHARMACY CONSULT NOTE - ELECTROLYTES  Pharmacy Consult for Electrolyte Monitoring and Replacement   Recent Labs: Height: 6\' 3"  (190.5 cm) Weight: 102.3 kg (225 lb 8.5 oz) IBW/kg (Calculated) : 84.5 Estimated Creatinine Clearance: 47.5 mL/min (A) (by C-G formula based on SCr of 1.5 mg/dL (H)). Potassium (mmol/L)  Date Value  12/14/2022 4.0  04/19/2014 3.0 (L)   Magnesium (mg/dL)  Date Value  60/63/0160 2.1  04/16/2014 2.3   Calcium (mg/dL)  Date Value  10/93/2355 8.8 (L)   Calcium, Total (mg/dL)  Date Value  73/22/0254 8.5   Albumin (g/dL)  Date Value  27/09/2374 2.9 (L)  04/15/2014 2.7 (L)   Phosphorus (mg/dL)  Date Value  28/31/5176 2.8   Sodium (mmol/L)  Date Value  12/14/2022 137  04/19/2014 135 (L)   Assessment Scott Buckley is a 82 y.o. male admitted 12/12/2022 for hypovolemic shock/sepsis with suspected aspiration pneumonia. 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   Goal of Therapy: Electrolytes WNL  Plan:  No replacement needed.  F/u with AM labs on 9/9.   Thank you for involving pharmacy in this patient's care.   Ronnald Ramp, PharmD Clinical Pharmacist 12/15/2022 7:40 AM

## 2022-12-16 DIAGNOSIS — J690 Pneumonitis due to inhalation of food and vomit: Secondary | ICD-10-CM | POA: Diagnosis not present

## 2022-12-16 DIAGNOSIS — K8689 Other specified diseases of pancreas: Secondary | ICD-10-CM | POA: Diagnosis not present

## 2022-12-16 DIAGNOSIS — R571 Hypovolemic shock: Secondary | ICD-10-CM | POA: Diagnosis not present

## 2022-12-16 LAB — GLUCOSE, CAPILLARY
Glucose-Capillary: 104 mg/dL — ABNORMAL HIGH (ref 70–99)
Glucose-Capillary: 105 mg/dL — ABNORMAL HIGH (ref 70–99)
Glucose-Capillary: 111 mg/dL — ABNORMAL HIGH (ref 70–99)
Glucose-Capillary: 113 mg/dL — ABNORMAL HIGH (ref 70–99)
Glucose-Capillary: 118 mg/dL — ABNORMAL HIGH (ref 70–99)
Glucose-Capillary: 122 mg/dL — ABNORMAL HIGH (ref 70–99)
Glucose-Capillary: 98 mg/dL (ref 70–99)

## 2022-12-16 LAB — MAGNESIUM: Magnesium: 1.8 mg/dL (ref 1.7–2.4)

## 2022-12-16 LAB — BASIC METABOLIC PANEL
Anion gap: 6 (ref 5–15)
BUN: 25 mg/dL — ABNORMAL HIGH (ref 8–23)
CO2: 27 mmol/L (ref 22–32)
Calcium: 8.5 mg/dL — ABNORMAL LOW (ref 8.9–10.3)
Chloride: 104 mmol/L (ref 98–111)
Creatinine, Ser: 1.21 mg/dL (ref 0.61–1.24)
GFR, Estimated: 59 mL/min — ABNORMAL LOW (ref >60)
Glucose, Bld: 105 mg/dL — ABNORMAL HIGH (ref 70–99)
Potassium: 3.6 mmol/L (ref 3.5–5.1)
Sodium: 137 mmol/L (ref 135–145)

## 2022-12-16 LAB — PHOSPHORUS: Phosphorus: 2.8 mg/dL (ref 2.5–4.6)

## 2022-12-16 MED ORDER — PANTOPRAZOLE SODIUM 40 MG PO TBEC
40.0000 mg | DELAYED_RELEASE_TABLET | Freq: Every day | ORAL | Status: DC
  Administered 2022-12-16 – 2022-12-17 (×2): 40 mg via ORAL
  Filled 2022-12-16 (×2): qty 1

## 2022-12-16 NOTE — Progress Notes (Signed)
Physical Therapy Treatment Patient Details Name: Scott Buckley MRN: [id number] DOB: February 25, 1941 Today's Date: 12/16/2022   History of Present Illness Pt is a 82 year old gentleman with history of type 2 diabetes, hypertension, chronic kidney disease stage IIIa, prostate cancer, urine retention, iliac artery aneurysm status post endovascular repair, presyncope and syncope, presenting with fall/syncope.  Admitted for management of hypovolemic shock and sepsis related to suspected aspiration PNA.    PT Comments  Patient received in bed, wife at bedside. He is agreeable to PT session. Patient required min A for bed mobility and contact guard for sit to stand from elevated surface. Patient is able to ambulate 60 feet with RW and performed standing balance and strengthening exercises. He will continue to benefit from skilled PT to improve strength and safety with mobility.     If plan is discharge home, recommend the following: A little help with walking and/or transfers;A little help with bathing/dressing/bathroom   Can travel by private vehicle     Yes  Equipment Recommendations  None recommended by PT    Recommendations for Other Services       Precautions / Restrictions Precautions Precautions: Fall Precaution Comments: foley, rectal tube Restrictions Weight Bearing Restrictions: No     Mobility  Bed Mobility Overal bed mobility: Needs Assistance Bed Mobility: Supine to Sit, Sit to Supine     Supine to sit: Min assist Sit to supine: Min assist   General bed mobility comments: min A with LEs on and off bed    Transfers Overall transfer level: Needs assistance Equipment used: Rolling walker (2 wheels) Transfers: Sit to/from Stand Sit to Stand: Contact guard assist, From elevated surface           General transfer comment: cues for safety, bed elevated, wife states he sleeps in lift chair.    Ambulation/Gait Ambulation/Gait assistance: Contact guard assist Gait  Distance (Feet): 70 Feet Assistive device: Rolling walker (2 wheels) Gait Pattern/deviations: Step-through pattern, Decreased step length - right, Decreased step length - left, Decreased stride length, Trunk flexed Gait velocity: decr     General Gait Details: min guard with ambulation using RW   Stairs             Wheelchair Mobility     Tilt Bed    Modified Rankin (Stroke Patients Only)       Balance Overall balance assessment: Needs assistance Sitting-balance support: Feet supported Sitting balance-Leahy Scale: Good     Standing balance support: Bilateral upper extremity supported, During functional activity, Reliant on assistive device for balance Standing balance-Leahy Scale: Good                              Cognition Arousal: Alert Behavior During Therapy: WFL for tasks assessed/performed Overall Cognitive Status: Within Functional Limits for tasks assessed                                          Exercises Other Exercises Other Exercises: standing exercises- marching, heel raises, mini squats, forward and backward ambulation 4'x2 reps without UE support    General Comments        Pertinent Vitals/Pain Pain Assessment Pain Assessment: 0-10 Faces Pain Scale: Hurts a little bit Pain Location: buttocks Pain Descriptors / Indicators: Aching, Grimacing Pain Intervention(s): Monitored during session, Repositioned    Home Living  Prior Function            PT Goals (current goals can now be found in the care plan section) Acute Rehab PT Goals Patient Stated Goal: to return home PT Goal Formulation: With patient/family Time For Goal Achievement: 12/28/22 Potential to Achieve Goals: Good Progress towards PT goals: Progressing toward goals    Frequency    Min 1X/week      PT Plan      Co-evaluation              AM-PAC PT "6 Clicks" Mobility   Outcome Measure  Help  needed turning from your back to your side while in a flat bed without using bedrails?: A Little Help needed moving from lying on your back to sitting on the side of a flat bed without using bedrails?: A Little Help needed moving to and from a bed to a chair (including a wheelchair)?: A Little Help needed standing up from a chair using your arms (e.g., wheelchair or bedside chair)?: A Little Help needed to walk in hospital room?: A Little Help needed climbing 3-5 steps with a railing? : A Lot 6 Click Score: 17    End of Session Equipment Utilized During Treatment: Gait belt Activity Tolerance: Patient tolerated treatment well Patient left: in bed;with call bell/phone within reach;with bed alarm set;with family/visitor present Nurse Communication: Mobility status PT Visit Diagnosis: Muscle weakness (generalized) (M62.81);Difficulty in walking, not elsewhere classified (R26.2)     Time: 2130-8657 PT Time Calculation (min) (ACUTE ONLY): 26 min  Charges:    $Therapeutic Activity: 8-22 mins PT General Charges $$ ACUTE PT VISIT: 1 Visit                     Natashia Roseman, PT, GCS 12/16/22,3:50 PM

## 2022-12-16 NOTE — Progress Notes (Signed)
Progress Note   Patient: Scott Buckley OZH:[id number] DOB: [birth year] DOA: 12/12/2022     4 DOS: the patient was seen and examined on 12/16/2022   Brief hospital course: Scott Buckley is a 82 year old male with history of essential hypertension, type 2 diabetes, dyslipidemia, chronic kidney disease stage IIIa, who came to the hospital with nausea vomiting, was found to have aspiration pneumonia.  He also had a history of pancreatic insufficiency, has chronic diarrhea. He had a syncope prior to the hospitalization, he was found to have hypovolemic shock.  He was given IV fluids and pressor.  Chest x-ray also showed bilateral lower lobe aspiration pneumonia.  He was placed on broad-spectrum antibiotics. Condition has improved, transfer out of ICU on 9/6. Patient condition is improving, antibiotic changed to oral on 9/8.   Active Problems:   Septic shock (HCC)   Vasovagal syncopes   CKD stage 3a, GFR 45-59 ml/min (HCC)   Functional diarrhea   Hypotension due to hypovolemia   PAD (peripheral artery disease) (HCC)   Wide-complex tachycardia   Syncope and collapse   Hypovolemic shock (HCC)   Pancreatic insufficiency   Aspiration pneumonia of both lower lobes (HCC)   Aspiration into airway   Assessment and Plan:  Syncope and collapse. Hypovolemic shock. Nausea vomiting and diarrhea with chronic pancreatic sufficiency. Discussed with patient daughter, patient has chronic diarrhea, at the time of admission, he also developed nausea vomiting, which has caused syncope due to dehydration.  Patient will initially was placed on pressors, condition had improved. Patient also has chronic pancreatic insufficiency, the source of chronic diarrhea.  Placed on pancreatic enzymes chronically. Patient had a more diarrhea yesterday, did not remove rectal tube.  Increase the dose of pancreatic enzyme, continued oral Imodium and Florastor. May consider remove Foley catheter today if diarrhea is slowing  down.   Bilateral lower lobe aspiration pneumonia Sepsis ruled out. Acute hypoxemic respiratory failure ruled out. Reviewed the chart, patient does not meet sepsis criteria.  I initial imaging study, patient has bilateral lower lobe aspiration pneumonia, he also has mild elevation of procalcitonin level.  He does not have a choking episode or dysphagia.  Aspiration pneumonia is secondary to nausea vomiting. He was  on Zosyn, changed to Unasyn.   Respiratory status improving, with consistent diarrhea, changed abx to augmentin at evening of 9/8 Reviewed chart, patient does not have documented hypoxemia. Condition has improved.   Chronic kidney disease stage IIIa. Mild metabolic acidosis. 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 Acute kidney injury ruled out. Reviewed prior labs, patient does not have acute kidney injury.  Renal function was started higher time admission, had improved to baseline.  It appears that the patient had a urinary retention time of admission, added Flomax.  Renal functions better than baseline, will remove Foley catheter in the morning tomorrow.   Type 2 diabetes with hyperglycemia. A1c 6.4, controlled.  Increased glucose due to stress.  Continue sliding scale insulin.   Pressure ulcers. POA Followed by RN. Pressure Injury 12/12/22 Buttocks Right Deep Tissue Pressure Injury - Purple or maroon localized area of discolored intact skin or blood-filled blister due to damage of underlying soft tissue from pressure and/or shear. (Active)  12/12/22 2200  Location: Buttocks  Location Orientation: Right  Staging: Deep Tissue Pressure Injury - Purple or maroon localized area of discolored intact skin or blood-filled blister due to damage of underlying soft tissue from pressure and/or shear.  Wound Description (Comments):   Present on Admission: Yes  Pressure Injury 12/12/22 Buttocks Left Deep Tissue Pressure Injury - Purple or maroon localized area of  discolored intact skin or blood-filled blister due to damage of underlying soft tissue from pressure and/or shear. 2 holes noted, upper 1x1, lower 1x. (Active)  12/12/22 2200  Location: Buttocks  Location Orientation: Left  Staging: Deep Tissue Pressure Injury - Purple or maroon localized area of discolored intact skin or blood-filled blister due to damage of underlying soft tissue from pressure and/or shear.  Wound Description (Comments): 2 holes noted, upper 1x1, lower 1x.5  Present on Admission: Yes     Pressure Injury 12/12/22 Thigh Posterior;Proximal;Right Deep Tissue Pressure Injury - Purple or maroon localized area of discolored intact skin or blood-filled blister due to damage of underlying soft tissue from pressure and/or shear. (Active)  12/12/22 2200  Location: Thigh  Location Orientation: Posterior;Proximal;Right  Staging: Deep Tissue Pressure Injury - Purple or maroon localized area of discolored intact skin or blood-filled blister due to damage of underlying soft tissue from pressure and/or shear.  Wound Description (Comments):   Present on Admission: Yes        Subjective:  Patient feels much improved, still has some loose stools in the rectal tube.  No nausea vomiting, tolerating diet.  Physical Exam: Vitals:   12/16/22 0500 12/16/22 0517 12/16/22 0613 12/16/22 0808  BP:  (!) 169/88 (!) 165/84 (!) 165/79  Pulse:  62  (!) 58  Resp:  16  19  Temp:  98.7 F (37.1 C)  98.5 F (36.9 C)  TempSrc:  Oral    SpO2:  98%  97%  Weight: 102.7 kg     Height:       General exam: Appears calm and comfortable  Respiratory system: Clear to auscultation. Respiratory effort normal. Cardiovascular system: S1 & S2 heard, RRR. No JVD, murmurs, rubs, gallops or clicks. No pedal edema. Gastrointestinal system: Abdomen is nondistended, soft and nontender. No organomegaly or masses felt. Normal bowel sounds heard. Central nervous system: Alert and oriented. No focal neurological  deficits. Extremities: Symmetric 5 x 5 power. Skin: No rashes, lesions or ulcers Psychiatry: Judgement and insight appear normal. Mood & affect appropriate.    Data Reviewed:  Lab results reviewed.  Family Communication: Wife updated at the bedside.  Disposition: Status is: Inpatient Remains inpatient appropriate because: Severity of disease, PT/OT recommended nursing placement, patient and family decided to take patient home tomorrow with home health.     Time spent: 35 minutes  Author: Marrion Coy, MD 12/16/2022 12:06 PM  For on call review [email or web address].

## 2022-12-16 NOTE — Plan of Care (Signed)
  Problem: Education: Goal: Knowledge of General Education information will improve Description: Including pain rating scale, medication(s)/side effects and non-pharmacologic comfort measures Outcome: Progressing   Problem: Health Behavior/Discharge Planning: Goal: Ability to manage health-related needs will improve Outcome: Progressing   Problem: Clinical Measurements: Goal: Ability to maintain clinical measurements within normal limits will improve Outcome: Progressing Goal: Will remain free from infection Outcome: Progressing Goal: Diagnostic test results will improve Outcome: Progressing Goal: Respiratory complications will improve Outcome: Progressing Goal: Cardiovascular complication will be avoided Outcome: Progressing   Problem: Activity: Goal: Risk for activity intolerance will decrease Outcome: Progressing   Problem: Nutrition: Goal: Adequate nutrition will be maintained Outcome: Progressing   Problem: Coping: Goal: Level of anxiety will decrease Outcome: Progressing   Problem: Elimination: Goal: Will not experience complications related to bowel motility Outcome: Progressing Goal: Will not experience complications related to urinary retention Outcome: Progressing   Problem: Skin Integrity: Goal: Risk for impaired skin integrity will decrease Outcome: Progressing   

## 2022-12-16 NOTE — Progress Notes (Signed)
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  Admitted for management of hypovolemic shock and sepsis related to suspected aspiration PNA.   OT comments  Mr Schultheis was seen for OT/PT co-treatment on this date. Upon arrival to room pt reclined in bed, agreeable to tx. Pt requires CGA + RW for ADL t/f and standing grooming tasks. Tolerated standing functional reaching balance tasks with intermittent single UE support. Pt making good progress toward goals, will continue to follow POC. Discharge recommendation updated to reflect pt progress.       If plan is discharge home, recommend the following:  Two people to help with walking and/or transfers;A lot of help with bathing/dressing/bathroom;Assist for transportation;Help with stairs or ramp for entrance;Assistance with cooking/housework   Equipment Recommendations  None recommended by OT    Recommendations for Other Services      Precautions / Restrictions Precautions Precautions: Fall Precaution Comments: foley, rectal tube Restrictions Weight Bearing Restrictions: No       Mobility Bed Mobility Overal bed mobility: Needs Assistance Bed Mobility: Supine to Sit, Sit to Supine     Supine to sit: Min assist Sit to supine: Mod assist        Transfers Overall transfer level: Needs assistance Equipment used: Rolling walker (2 wheels) Transfers: Sit to/from Stand Sit to Stand: Contact guard assist, From elevated surface                 Balance Overall balance assessment: Needs assistance Sitting-balance support: Feet supported Sitting balance-Leahy Scale: Good      Standing balance support: No upper extremity supported, During functional activity Standing balance-Leahy Scale: Fair                             ADL either performed or assessed with clinical judgement   ADL Overall ADL's : Needs assistance/impaired                                       General ADL Comments: CGA + RW for ADL t/f and standing grooming tasks      Cognition Arousal: Alert Behavior During Therapy: WFL for tasks assessed/performed Overall Cognitive Status: Within Functional Limits for tasks assessed                                                     Pertinent Vitals/ Pain       Pain Assessment Pain Assessment: Faces Faces Pain Scale: Hurts a little bit Pain Location: bottom Pain Descriptors / Indicators: Aching, Grimacing Pain Intervention(s): Limited activity within patient's tolerance, Repositioned   Frequency  Min 1X/week        Progress Toward Goals  OT Goals(current goals can now be found in the care plan section)  Progress towards OT goals: Progressing toward goals  Acute Rehab OT Goals Patient Stated Goal: go home OT Goal Formulation: With patient/family Time For Goal Achievement: 12/28/22 Potential to Achieve Goals: Good  ADL Goals Pt Will Perform Lower Body Dressing: with min assist;sit to/from stand Pt Will Transfer to Toilet: with min assist;bedside commode;stand pivot transfer Pt Will Perform Toileting - Clothing Manipulation and hygiene: sitting/lateral leans;with set-up;with supervision Additional ADL Goal #1: Pt will verbalize plan to implement at least 2 learned falls prevention/ECS to incorporate into daily ADL routines to maximize safety and independence.  Plan      Co-evaluation    PT/OT/SLP Co-Evaluation/Treatment: Yes Reason for Co-Treatment: To address functional/ADL transfers PT goals addressed during session: Mobility/safety with mobility OT goals addressed during  session: ADL's and self-care      AM-PAC OT "6 Clicks" Daily Activity     Outcome Measure   Help from another person eating meals?: None Help from another person taking care of personal grooming?: A Little Help from another person toileting, which includes using toliet, bedpan, or urinal?: A Lot Help from another person bathing (including washing, rinsing, drying)?: A Lot Help from another person to put on and taking off regular upper body clothing?: None Help from another person to put on and taking off regular lower body clothing?: A Lot 6 Click Score: 17    End of Session Equipment Utilized During Treatment: Rolling walker (2 wheels)  OT Visit Diagnosis: Other abnormalities of gait and mobility (R26.89);History of falling (Z91.81)   Activity Tolerance Patient tolerated treatment well   Patient Left in bed;with call bell/phone within reach;with family/visitor present   Nurse Communication          Time: 8119-1478 OT Time Calculation (min): 24 min  Charges: OT General Charges $OT Visit: 1 Visit OT Treatments $Self Care/Home Management : 8-22 mins  Kathie Dike, M.S. OTR/L  12/16/22, 4:04 PM  ascom [phone]

## 2022-12-16 NOTE — Progress Notes (Signed)
Per Dr Zhang, dc tele monitoring  

## 2022-12-16 NOTE — Care Management Important Message (Signed)
Important Message  Patient Details  Name: Scott Buckley MRN: [id number] Date of Birth: 07/13/1940   Medicare Important Message Given:  N/A - LOS <3 / Initial given by admissions     [name] [name] 12/16/2022, 9:53 AM

## 2022-12-17 DIAGNOSIS — R55 Syncope and collapse: Secondary | ICD-10-CM | POA: Diagnosis not present

## 2022-12-17 DIAGNOSIS — Z515 Encounter for palliative care: Secondary | ICD-10-CM | POA: Diagnosis not present

## 2022-12-17 DIAGNOSIS — K8689 Other specified diseases of pancreas: Secondary | ICD-10-CM | POA: Diagnosis not present

## 2022-12-17 DIAGNOSIS — J9601 Acute respiratory failure with hypoxia: Secondary | ICD-10-CM | POA: Diagnosis not present

## 2022-12-17 DIAGNOSIS — R571 Hypovolemic shock: Secondary | ICD-10-CM | POA: Diagnosis not present

## 2022-12-17 LAB — CULTURE, BLOOD (ROUTINE X 2)
Culture: NO GROWTH
Culture: NO GROWTH
Special Requests: ADEQUATE

## 2022-12-17 LAB — GLUCOSE, CAPILLARY
Glucose-Capillary: 106 mg/dL — ABNORMAL HIGH (ref 70–99)
Glucose-Capillary: 101 mg/dL — ABNORMAL HIGH (ref 70–99)
Glucose-Capillary: 109 mg/dL — ABNORMAL HIGH (ref 70–99)
Glucose-Capillary: 108 mg/dL — ABNORMAL HIGH (ref 70–99)

## 2022-12-17 MED ORDER — SACCHAROMYCES BOULARDII 250 MG PO CAPS: 250.0000 mg | ORAL_CAPSULE | Freq: Two times a day (BID) | ORAL | 0 refills | Status: DC

## 2022-12-17 MED ORDER — LOPERAMIDE HCL 2 MG PO CAPS
2.0000 mg | ORAL_CAPSULE | Freq: Three times a day (TID) | ORAL | Status: DC
  Administered 2022-12-17: 2 mg via ORAL
  Filled 2022-12-17: qty 1

## 2022-12-17 MED ORDER — CREON 36000-114000 UNITS PO CPEP: 36000.0000 [IU] | ORAL_CAPSULE | Freq: Three times a day (TID) | ORAL | 0 refills | Status: DC

## 2022-12-17 MED ORDER — LOPERAMIDE HCL 2 MG PO CAPS: 2.0000 mg | ORAL_CAPSULE | Freq: Three times a day (TID) | ORAL | 0 refills | Status: DC | PRN

## 2022-12-17 MED ORDER — PANTOPRAZOLE SODIUM 40 MG PO TBEC: 40.0000 mg | DELAYED_RELEASE_TABLET | Freq: Every day | ORAL | 0 refills | Status: AC

## 2022-12-17 NOTE — Progress Notes (Signed)
Palliative Care Progress Note, Assessment & Plan   Patient Name: Scott Buckley       Date: 12/17/2022 DOB: Dec 26, 1940  Age: 82 y.o. MRN#: [id number] Attending Physician: Marrion Coy, MD Primary Care Physician: Wilford Corner, PA-C Admit Date: 12/12/2022  Subjective: Patient is sitting up in bed in no apparent distress.  He acknowledges my presence and is able to make his wishes known.  His wife and caregiver are at bedside during my visit.  HPI: Scott Buckley is a 82 year old male with history of essential hypertension, type 2 diabetes, dyslipidemia, chronic kidney disease stage IIIa, who came to the hospital with nausea vomiting, was found to have aspiration pneumonia.  He also had a history of pancreatic insufficiency, has chronic diarrhea. He had a syncope prior to the hospitalization, he was found to have hypovolemic shock.  He was given IV fluids and pressor.  Chest x-ray also showed bilateral lower lobe aspiration pneumonia.  He was placed on broad-spectrum antibiotics. Condition has improved, transfer out of ICU on 9/6. Patient condition is improving, antibiotic changed to oral on 9/8.   Patient condition had improved, antibiotics completed today. Patient also had a significant diarrhea due to pancreatic insufficiency.  He has been treated with higher dose of Creon, Imodium, Florastor.  Diarrhea much better.   Patient is medically stable to be discharged.  Summary of counseling/coordination of care: After reviewing the patient's chart and assessing the patient at bedside, I spoke with patient in regards to plan and goals of care.  Symptoms assessed.  Patient endorses he has no acute complaints at this time.  Pain assessment completed.  No adjustment to [facility] needed.  I attempted to elicit  values and goals important to the patient.  I highlighted that we had briefly spoken about his wishes while in ICU but patient does not recall.  Advance directives, artificial hydration/nutrition, and boundaries of care discussed.  Full code versus DNR and DNI status discussed in detail.  Opportunity and space provided for patient and family to ask questions.  In the event of a cardiopulmonary arrest, patient endorses that he would not be accepting of resuscitative measures.  Additionally, he shares he would never want to be placed on a ventilator.  He would like to treat the treatable but never have his life artificially sustained by machines.  CODE STATUS changed to DNR with limited interventions.  Education provided to family on DNR goldenrod form.  Attending and RN made aware of CODE STATUS change.  I also attempted to discuss overall prognosis with patient.  Functional, nutritional, and cognitive status discussed is important indicators of patient's overall prognosis.  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  Education provided on aneurysm, Creon, diabetic diet, and syncope/collapse.  Patient shares he begins to feel not well in the hours leading up to a syncopal event.  Encourage patient to notify his family and friends and get to somewhere safe if he begins to feel/experiences signs or symptoms of syncope.  Wife cares concerns of high pill count.  Recommended investigating option  of pill packs to help with pill/medication administration.  Symptom burden low.  Goals clear.  Plan is to discharge home today.  No further palliative needs at this time.  PMT remains available to patient and family throughout his hospitalization.  Please engage with PMT if any acute issues arise before discharge.  Physical Exam Vitals reviewed.  Constitutional:       General: He is not in acute distress.    Appearance: He is normal weight.  HENT:     Head: Normocephalic.     Mouth/Throat:     Mouth: Mucous membranes are moist.  Eyes:     Pupils: Pupils are equal, round, and reactive to light.  Pulmonary:     Effort: Pulmonary effort is normal.  Abdominal:     Palpations: Abdomen is soft.  Skin:    General: Skin is warm and dry.  Neurological:     Mental Status: He is alert and oriented to person, place, and time.  Psychiatric:        Mood and Affect: Mood normal.        Behavior: Behavior normal.             Total Time 50 minutes   Scott Buckley L. Bonita Quin, DNP, FNP-BC Palliative Medicine Team

## 2022-12-17 NOTE — Discharge Summary (Signed)
Physician Discharge Summary   Patient: Scott Buckley MRN: [id number] DOB: 1941/02/15  Admit date:     12/12/2022  Discharge date: 12/17/22  Discharge Physician: Marrion Coy   PCP: Wilford Corner, PA-C   Recommendations at discharge:   Follow-up with PCP in 1 week.  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 *  Hospital Course: Scott Buckley is a 82 year old male with history of essential hypertension, type 2 diabetes, dyslipidemia, chronic kidney disease stage IIIa, who came to the hospital with nausea vomiting, was found to have aspiration pneumonia.  He also had a history of pancreatic insufficiency, has chronic diarrhea. He had a syncope prior to the hospitalization, he was found to have hypovolemic shock.  He was given IV fluids and pressor.  Chest x-ray also showed bilateral lower lobe aspiration pneumonia.  He was placed on broad-spectrum antibiotics. Condition has improved, transfer out of ICU on 9/6. Patient condition is improving, antibiotic changed to oral on 9/8.  Patient condition had improved, antibiotics completed today. Patient also had a significant diarrhea due to pancreatic insufficiency.  He has been treated with higher dose of Creon, Imodium, Florastor.  Diarrhea much better.  Patient is medically stable to be discharged.  Assessment and Plan: Syncope and collapse. Hypovolemic shock. Nausea vomiting and diarrhea with chronic pancreatic sufficiency. Discussed with patient daughter, patient has chronic diarrhea, at the time of admission, he also  developed nausea vomiting, which has caused syncope due to dehydration.  Patient will initially was placed on pressors, condition had improved. Patient also has chronic pancreatic insufficiency, the source of chronic diarrhea.  Placed on pancreatic enzymes chronically. Patient had a more diarrhea yesterday, did not remove rectal tube.  Increase the dose of pancreatic enzyme, continued oral Imodium and Florastor. Diarrhea much improved, rectal tube removed.  Will continue pancreatic enzymes.  Follow-up with PCP as outpatient.   Bilateral lower lobe aspiration pneumonia Sepsis ruled out. Acute hypoxemic respiratory failure ruled out. Reviewed the chart, patient does not meet sepsis criteria.  I initial imaging study, patient has bilateral lower lobe aspiration pneumonia, he also has mild elevation of procalcitonin level.  He does not have a choking episode or dysphagia.  Aspiration pneumonia is secondary to nausea vomiting. He was  on Zosyn, changed to Unasyn.   Respiratory status improving, with consistent diarrhea, changed abx to augmentin at evening of 9/8 Reviewed chart, patient does not have documented hypoxemia. Antibiotics completed.   Chronic kidney disease stage IIIa. Mild metabolic acidosis. 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 Acute kidney injury ruled out. Reviewed prior labs, patient does not have acute kidney injury.  Renal function was started higher time admission, had improved to baseline.  It appears that the patient had a urinary retention time of admission, added Flomax.  Renal functions better than baseline, Foley catheter removed this morning, patient was able to urinate. Type 2 diabetes with hyperglycemia. A1c 6.4, controlled.  Increased glucose due to stress.  Continue sliding scale insulin.   Pressure ulcers. POA Followed by visiting RN. Pressure Injury 12/12/22 Buttocks Right Deep  Tissue Pressure Injury - Purple or maroon localized area of  discolored intact skin or blood-filled blister due to damage of underlying soft tissue from pressure and/or shear. (Active)  12/12/22 2200  Location: Buttocks  Location Orientation: Right  Staging: Deep Tissue Pressure Injury - Purple or maroon localized area of discolored intact skin or blood-filled blister due to damage of underlying soft tissue from pressure and/or shear.  Wound Description (Comments):   Present on Admission: Yes     Pressure Injury 12/12/22 Buttocks Left Deep Tissue Pressure Injury - Purple or maroon localized area of discolored intact skin or blood-filled blister due to damage of underlying soft tissue from pressure and/or shear. 2 holes noted, upper 1x1, lower 1x. (Active)  12/12/22 2200  Location: Buttocks  Location Orientation: Left  Staging: Deep Tissue Pressure Injury - Purple or maroon localized area of discolored intact skin or blood-filled blister due to damage of underlying soft tissue from pressure and/or shear.  Wound Description (Comments): 2 holes noted, upper 1x1, lower 1x.5  Present on Admission: Yes     Pressure Injury 12/12/22 Thigh Posterior;Proximal;Right Deep Tissue Pressure Injury - Purple or maroon localized area of discolored intact skin or blood-filled blister due to damage of underlying soft tissue from pressure and/or shear. (Active)  12/12/22 2200  Location: Thigh  Location Orientation: Posterior;Proximal;Right  Staging: Deep Tissue Pressure Injury - Purple or maroon localized area of discolored intact skin or blood-filled blister due to damage of underlying soft tissue from pressure and/or shear.  Wound Description (Comments):   Present on Admission: Yes            Consultants: ICU Procedures performed: None  Disposition: Home health Diet recommendation:  Discharge Diet Orders (From admission, onward)     Start     Ordered   12/17/22 0000  Diet - low sodium heart healthy        12/17/22 1311           Cardiac  diet DISCHARGE MEDICATION: Allergies as of 12/17/2022       Reactions   Metformin Diarrhea   Sulfa Antibiotics Nausea And Vomiting, Other (See Comments)   Patient states not allergic   Varicella Virus Vaccine Live Other (See Comments)   Cramps and weakness   Zoster Vac Recomb Adjuvanted    Other reaction(s): Not available   Zoster Vaccine Recombinant, Adjuvanted    Other Reaction(s): Unknown Other reaction(s): Not available        Medication List     STOP taking these medications    furosemide 20 MG tablet Commonly known as: LASIX       TAKE these medications    acetaminophen 650 MG CR tablet Commonly known as: TYLENOL Take 650 mg by mouth every 8 (eight) hours as needed.   Creon 36000-114000 units Cpep capsule Generic drug: lipase/protease/amylase Take 1 capsule (36,000 Units total) by mouth 4 (four) times daily -  before meals and at bedtime. What changed: when to take this   cyanocobalamin 1000 MCG tablet Commonly known as: VITAMIN B12 Take by mouth.   finasteride 5 MG tablet Commonly known as: PROSCAR Take 1 tablet (5 mg total) by mouth daily.   hydrocortisone 2.5 % cream Apply topically 2 (two) times daily.   lamoTRIgine 25 MG tablet Commonly known as: LAMICTAL Take 25 mg by mouth.   lisinopril 20 MG tablet Commonly known as: ZESTRIL Take 20 mg by mouth daily.   loperamide 2 MG capsule Commonly known as: IMODIUM Take 1 capsule (  2 mg total) by mouth 3 (three) times daily as needed for diarrhea or loose stools.   multivitamin tablet Take 1 tablet by mouth daily.   mupirocin ointment 2 % Commonly known as: BACTROBAN Apply 1 Application topically 3 (three) times daily.   ondansetron 4 MG disintegrating tablet Commonly known as: ZOFRAN-ODT Take 1 tablet (4 mg total) by mouth every 8 (eight) hours as needed.   pantoprazole 40 MG tablet Commonly known as: PROTONIX Take 1 tablet (40 mg total) by mouth daily for 14 days. Start taking on:  December 18, 2022   rosuvastatin 20 MG tablet Commonly known as: CRESTOR Take 20 mg by mouth daily.   saccharomyces boulardii 250 MG capsule Commonly known as: FLORASTOR Take 1 capsule (250 mg total) by mouth 2 (two) times daily.   tamsulosin 0.4 MG Caps capsule Commonly known as: FLOMAX Take 1 capsule (0.4 mg total) by mouth daily after supper.               Discharge Care Instructions  (From admission, onward)           Start     Ordered   12/17/22 0000  Discharge wound care:       Comments: Follow with RN   12/17/22 1311            Follow-up Information     Whitaker, Barbara Cower Hestle, PA-C Follow up in 1 week(s).   Specialty: Family Medicine Contact information: [address] [place] [address] [phone]                Discharge Exam: Filed Weights   12/14/22 0444 12/15/22 0500 12/16/22 0500  Weight: 101.5 kg 102.3 kg 102.7 kg   General exam: Appears calm and comfortable  Respiratory system: Clear to auscultation. Respiratory effort normal. Cardiovascular system: S1 & S2 heard, RRR. No JVD, murmurs, rubs, gallops or clicks. No pedal edema. Gastrointestinal system: Abdomen is nondistended, soft and nontender. No organomegaly or masses felt. Normal bowel sounds heard. Central nervous system: Alert and oriented. No focal neurological deficits. Extremities: Symmetric 5 x 5 power. Skin: No rashes, lesions or ulcers Psychiatry: Judgement and insight appear normal. Mood & affect appropriate.    Condition at discharge: good  The results of significant diagnostics from this hospitalization (including imaging, microbiology, ancillary and laboratory) are listed below for reference.   Imaging Studies: ECHOCARDIOGRAM LIMITED  Result Date: 12/14/2022    ECHOCARDIOGRAM LIMITED REPORT   Patient Name:   Scott Buckley Date of Exam: 12/14/2022 Medical Rec #:  [id number]       Height:       75.0 in Accession #:    [id number]      Weight:        223.8 lb Date of Birth:  [birth year]        BSA:          2.302 m Patient Age:    82 years        BP:           130/112 mmHg Patient Gender: M               HR:           52 bpm. Exam Location:  [facility] Procedure: 2D Echo, Limited Color Doppler and Color Doppler Indications:     assess LVF  History:         Patient has prior history of Echocardiogram examinations, most  recent 05/20/2022. Risk Factors:Diabetes, Hypertension and                  Dyslipidemia.  Sonographer:     Melissa Morford RDCS (AE, PE) Referring Phys:  [id number] Diagnosing Phys: Julien Nordmann MD IMPRESSIONS  1. Left ventricular ejection fraction, by estimation, is 55 to 60%. The left ventricle has normal function. The left ventricle has no regional wall motion abnormalities. There is mild left ventricular hypertrophy.  2. Right ventricular systolic function is normal. The right ventricular size is normal. There is normal pulmonary artery systolic pressure. The estimated right ventricular systolic pressure is 29.8 mmHg.  3. The mitral valve is normal in structure. No evidence of mitral valve regurgitation. No evidence of mitral stenosis.  4. Tricuspid valve regurgitation is mild to moderate.  5. The aortic valve is tricuspid. There is mild calcification of the aortic valve. Aortic valve regurgitation is mild. Aortic valve sclerosis is present, with no evidence of aortic valve stenosis.  6. The inferior vena cava is normal in size with greater than 50% respiratory variability, suggesting right atrial pressure of 3 mmHg. FINDINGS  Left Ventricle: Left ventricular ejection fraction, by estimation, is 55 to 60%. The left ventricle has normal function. The left ventricle has no regional wall motion abnormalities. The left ventricular internal cavity size was normal in size. There is  mild left ventricular hypertrophy. Right Ventricle: The right ventricular size is normal. No increase in right ventricular wall thickness. Right  ventricular systolic function is normal. There is normal pulmonary artery systolic pressure. The tricuspid regurgitant velocity is 2.49 m/s, and  with an assumed right atrial pressure of 5 mmHg, the estimated right ventricular systolic pressure is 29.8 mmHg. Left Atrium: Left atrial size was normal in size. Right Atrium: Right atrial size was normal in size. Pericardium: There is no evidence of pericardial effusion. Mitral Valve: The mitral valve is normal in structure. No evidence of mitral valve stenosis. Tricuspid Valve: The tricuspid valve is normal in structure. Tricuspid valve regurgitation is mild to moderate. No evidence of tricuspid stenosis. Aortic Valve: The aortic valve is tricuspid. There is mild calcification of the aortic valve. Aortic valve regurgitation is mild. Aortic valve sclerosis is present, with no evidence of aortic valve stenosis. Pulmonic Valve: The pulmonic valve was normal in structure. Pulmonic valve regurgitation is not visualized. No evidence of pulmonic stenosis. Aorta: The aortic root is normal in size and structure. Venous: The inferior vena cava is normal in size with greater than 50% respiratory variability, suggesting right atrial pressure of 3 mmHg. IAS/Shunts: No atrial level shunt detected by color flow Doppler. LEFT VENTRICLE PLAX 2D LVIDd:         5.40 cm LVIDs:         4.10 cm LV PW:         1.10 cm LV IVS:        1.10 cm  LEFT ATRIUM         Index LA diam:    3.20 cm 1.39 cm/m  TRICUSPID VALVE TR Peak grad:   24.8 mmHg TR Vmax:        249.00 cm/s Julien Nordmann MD Electronically signed by Julien Nordmann MD Signature Date/Time: 12/14/2022/4:57:12 PM    Final    CT CHEST ABDOMEN PELVIS W CONTRAST  Result Date: 12/12/2022 CLINICAL DATA:  Fall.  Covered in vomitus. EXAM: CT CHEST, ABDOMEN, AND PELVIS WITH CONTRAST TECHNIQUE: Multidetector CT imaging of the chest, abdomen and pelvis was  performed following the standard protocol during bolus administration of intravenous  contrast. RADIATION DOSE REDUCTION: This exam was performed according to the departmental dose-optimization program which includes automated exposure control, adjustment of the mA and/or kV according to patient size and/or use of iterative reconstruction technique. CONTRAST:  80mL OMNIPAQUE IOHEXOL 300 MG/ML  SOLN COMPARISON:  09/12/2022 FINDINGS: CT CHEST FINDINGS Cardiovascular: Borderline heart size. No pericardial effusion. There is atheromatous calcification of the aorta and coronaries. Mediastinum/Nodes: Negative for mass or adenopathy. No hematoma or pneumomediastinum Lungs/Pleura: Hazy airspace opacity in the dependent lungs especially affecting the lower lobes where there is right more than left airway opacification. No pleural fluid, pneumothorax, or contusion like appearance. Musculoskeletal: No acute fracture or traumatic malalignment CT ABDOMEN PELVIS FINDINGS Hepatobiliary: No hepatic injury or perihepatic hematoma. Gallbladder is unremarkable. Pancreas: No visible injury. Spleen: Low-density fluid around the spleen. No visible laceration or contusion Adrenals/Urinary Tract: Right hydronephrosis without visible obstructing process. Clips are seen around the right kidney and ureter. Thick walled bladder circumferentially suggesting chronic outlet obstruction Stomach/Bowel: No specific signs of injury. Vascular/Lymphatic: Aorta bi-iliac stenting which is diffusely patent. No evidence of injury or adenopathy Reproductive: Enlarged prostate with calcifications and markers, largely obscured due to streak artifact from a left hip prosthesis. Other: Low-density ascites in the pelvis. No pneumoperitoneum. Lax abdominal wall. Musculoskeletal: Left hip prosthesis which is located. Embolization coils at the left sciatic notch. Severe right hip osteoarthritis. Intrinsic back muscle show diffuse atrophy. Advanced lumbar spine degeneration with mild scoliosis and L4-5 anterolisthesis. IMPRESSION: 1. Dependent  airspace disease, pattern and history suggesting aspiration. 2. No definitive signs of trauma. There is peritoneal fluid around the spleen and in the pelvis but low-density and without visible visceral injury. Suggest abdominal exam follow-up. 3. Right hydroureteronephrosis without visible cause. Symmetric renal enhancement and excretion. 4. Chronic findings are stable and described above. Electronically Signed   By: Tiburcio Pea M.D.   On: 12/12/2022 19:01   CT Head Wo Contrast  Result Date: 12/12/2022 CLINICAL DATA:  Head trauma, minor (Age >= 65y); Neck trauma (Age >= 65y) EXAM: CT HEAD WITHOUT CONTRAST CT CERVICAL SPINE WITHOUT CONTRAST TECHNIQUE: Multidetector CT imaging of the head and cervical spine was performed following the standard protocol without intravenous contrast. Multiplanar CT image reconstructions of the cervical spine were also generated. RADIATION DOSE REDUCTION: This exam was performed according to the departmental dose-optimization program which includes automated exposure control, adjustment of the mA and/or kV according to patient size and/or use of iterative reconstruction technique. COMPARISON:  CT Head 08/15/17 FINDINGS: CT HEAD FINDINGS Brain: No hemorrhage. No hydrocephalus. No extra-axial fluid collection. No CT evidence of an acute cortical infarct. 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 This has increased in size compared to 2019 at which time it measured up to 2.5 x 2.0 x 2.1 cm. There is likely also fusiform dilatation of the M1 segment of the left MCA. Skull: Normal. Negative for fracture or focal lesion. Sinuses/Orbits: 2 no middle ear or mastoid effusion. Paranasal sinuses are clear. Bilateral lens replacement. Orbits are otherwise unremarkable. Other: None. CT CERVICAL SPINE FINDINGS Alignment: Trace anterolisthesis of C2 on C3 and retrolisthesis of C3 on C4. Trace anterolisthesis of C4 on C5. Skull base and vertebrae:  No acute fracture. No primary bone lesion or focal pathologic process. Soft tissues and spinal canal: No prevertebral fluid or swelling. No visible canal hematoma. Disc levels:  No evidence of high-grade spinal  canal stenosis Upper chest: Negative. Other: None IMPRESSION: 1. No acute intracranial abnormality. 2. No acute fracture or traumatic subluxation of the cervical spine. 3. 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 This has increased in size compared to 2019 at which time it measured up to 2.5 x 2.0 x 2.1 Cm. There is likely also fusiform dilatation of the M1 segment of the left MCA. Recommend further evaluation with CTA. Neurosurgical consultation is also recommended, if this was not previously performed. Electronically Signed   By: Lorenza Cambridge M.D.   On: 12/12/2022 18:44   CT Cervical Spine Wo Contrast  Result Date: 12/12/2022 CLINICAL DATA:  Head trauma, minor (Age >= 65y); Neck trauma (Age >= 65y) EXAM: CT HEAD WITHOUT CONTRAST CT CERVICAL SPINE WITHOUT CONTRAST TECHNIQUE: Multidetector CT imaging of the head and cervical spine was performed following the standard protocol without intravenous contrast. Multiplanar CT image reconstructions of the cervical spine were also generated. RADIATION DOSE REDUCTION: This exam was performed according to the departmental dose-optimization program which includes automated exposure control, adjustment of the mA and/or kV according to patient size and/or use of iterative reconstruction technique. COMPARISON:  CT Head 08/15/17 FINDINGS: CT HEAD FINDINGS Brain: No hemorrhage. No hydrocephalus. No extra-axial fluid collection. No CT evidence of an acute cortical infarct. 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 This has increased in size compared to 2019 at which time it measured up to 2.5 x 2.0 x 2.1 cm. There is likely also fusiform dilatation of the M1 segment of the left MCA. Skull:  Normal. Negative for fracture or focal lesion. Sinuses/Orbits: 2 no middle ear or mastoid effusion. Paranasal sinuses are clear. Bilateral lens replacement. Orbits are otherwise unremarkable. Other: None. CT CERVICAL SPINE FINDINGS Alignment: Trace anterolisthesis of C2 on C3 and retrolisthesis of C3 on C4. Trace anterolisthesis of C4 on C5. Skull base and vertebrae: No acute fracture. No primary bone lesion or focal pathologic process. Soft tissues and spinal canal: No prevertebral fluid or swelling. No visible canal hematoma. Disc levels:  No evidence of high-grade spinal canal stenosis Upper chest: Negative. Other: None IMPRESSION: 1. No acute intracranial abnormality. 2. No acute fracture or traumatic subluxation of the cervical spine. 3. 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 This has increased in size compared to 2019 at which time it measured up to 2.5 x 2.0 x 2.1 Cm. There is likely also fusiform dilatation of the M1 segment of the left MCA. Recommend further evaluation with CTA. Neurosurgical consultation is also recommended, if this was not previously performed. Electronically Signed   By: Lorenza Cambridge M.D.   On: 12/12/2022 18:44    Microbiology: Results for orders placed or performed during the hospital encounter of 12/12/22  Culture, blood (routine x 2)     Status: None   Collection Time: 12/12/22  4:18 PM   Specimen: BLOOD  Result Value Ref Range Status   Specimen Description BLOOD RIGHT ANTECUBITAL  Final   Special Requests   Final    BOTTLES DRAWN AEROBIC AND ANAEROBIC Blood Culture adequate volume   Culture   Final    NO GROWTH 5 DAYS Performed at [facility], [address] [place], [place] [address]    Report Status 12/17/2022 FINAL  Final  Culture, blood (routine x 2)     Status: None   Collection  Time: 12/12/22  4:20 PM   Specimen: BLOOD  Result Value Ref Range Status   Specimen Description BLOOD LEFT ANTECUBITAL  Final    Special Requests   Final    BOTTLES DRAWN AEROBIC AND ANAEROBIC Blood Culture results may not be optimal due to an excessive volume of blood received in culture bottles   Culture   Final    NO GROWTH 5 DAYS Performed at [facility], [address] [place], [place] [address]    Report Status 12/17/2022 FINAL  Final  Gastrointestinal Panel by PCR , Stool     Status: None   Collection Time: 12/12/22  6:25 PM   Specimen: Stool  Result Value Ref Range Status   Campylobacter species NOT DETECTED NOT DETECTED Final   Plesimonas shigelloides NOT DETECTED NOT DETECTED Final   Salmonella species NOT DETECTED NOT DETECTED Final   Yersinia enterocolitica NOT DETECTED NOT DETECTED Final   Vibrio species NOT DETECTED NOT DETECTED Final   Vibrio cholerae NOT DETECTED NOT DETECTED Final   Enteroaggregative E coli (EAEC) NOT DETECTED NOT DETECTED Final   Enteropathogenic E coli (EPEC) NOT DETECTED NOT DETECTED Final   Enterotoxigenic E coli (ETEC) NOT DETECTED NOT DETECTED Final   Shiga like toxin producing E coli (STEC) NOT DETECTED NOT DETECTED Final   Shigella/Enteroinvasive E coli (EIEC) NOT DETECTED NOT DETECTED Final   Cryptosporidium NOT DETECTED NOT DETECTED Final   Cyclospora cayetanensis NOT DETECTED NOT DETECTED Final   Entamoeba histolytica NOT DETECTED NOT DETECTED Final   Giardia lamblia NOT DETECTED NOT DETECTED Final   Adenovirus F40/41 NOT DETECTED NOT DETECTED Final   Astrovirus NOT DETECTED NOT DETECTED Final   Norovirus GI/GII NOT DETECTED NOT DETECTED Final   Rotavirus A NOT DETECTED NOT DETECTED Final   Sapovirus (I, II, IV, and V) NOT DETECTED NOT DETECTED Final    Comment: Performed at [facility], [address] [place], [place] [address]  C Difficile Quick Screen w PCR reflex     Status: None   Collection Time: 12/12/22  6:25 PM   Specimen: STOOL  Result Value Ref Range Status   C Diff antigen NEGATIVE NEGATIVE Final   C Diff toxin NEGATIVE  NEGATIVE Final   C Diff interpretation No C. difficile detected.  Final    Comment: Performed at [facility] [address] [place], [place] [address]  MRSA Next Gen by PCR, Nasal     Status: None   Collection Time: 12/12/22 10:14 PM   Specimen: Nasal Mucosa; Nasal Swab  Result Value Ref Range Status   MRSA by PCR Next Gen NOT DETECTED NOT DETECTED Final    Comment: (NOTE) The GeneXpert MRSA Assay (FDA approved for NASAL specimens only), is one component of a comprehensive MRSA colonization surveillance program. It is not intended to diagnose MRSA infection nor to guide or monitor treatment for MRSA infections. Test performance is not FDA approved in patients less than 74 years old. Performed at [facility], [address] [place], [place] [address]     Labs: CBC: Recent Labs  Lab 12/12/22 1550 12/13/22 0502 12/14/22 0435  WBC 14.3* 12.3* 8.0  NEUTROABS 8.7*  --   --   HGB 14.2 11.9* 10.3*  HCT 44.8 35.7* 30.9*  MCV 92.4 89.0 89.3  PLT 231 173 156   Basic Metabolic Panel: Recent Labs  Lab 12/12/22 1550 12/12/22 2300 12/13/22 0502 12/14/22 0435 12/16/22 0351  NA 138  --  138 137 137  K 3.5  --  4.4 4.0 3.6  CL 103  --  105 103 104  CO2 21*  --  24 26 27   GLUCOSE 201*  --  171* 132* 105*  BUN 29*  --  28* 27* 25*  CREATININE 1.66*  --  1.36* 1.50* 1.21  CALCIUM 9.1  --  8.4* 8.8* 8.5*  MG 2.1  --  1.7 2.1 1.8  PHOS  --  3.2 2.8 2.8 2.8   Liver Function Tests: Recent Labs  Lab 12/12/22 1550 12/14/22 0435  AST 20  --   ALT 11  --   ALKPHOS 49  --   BILITOT 0.3  --   PROT 6.8  --   ALBUMIN 3.4* 2.9*   CBG: Recent Labs  Lab 12/16/22 2001 12/17/22 0135 12/17/22 0505 12/17/22 0730 12/17/22 1149  GLUCAP 122* 106* 101* 109* 108*    Discharge time spent: greater than 30 minutes.  Signed: Marrion Coy, MD [facility] 12/17/2022

## 2022-12-17 NOTE — Care Management Important Message (Signed)
Important Message  Patient Details  Name: Scott Buckley MRN: [id number] Date of Birth: 05-16-1940   Medicare Important Message Given:  Yes     [name] 12/17/2022, 1:47 PM

## 2022-12-17 NOTE — Progress Notes (Signed)
Patient being discharged home. PIVs removed. Went over discharge instructions and medications with patient and patients wife. They stated that they understood and all questions were answered. Patient going home POV with daughter and wife.

## 2022-12-17 NOTE — Progress Notes (Signed)
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  Admitted for management of hypovolemic shock and sepsis related to suspected aspiration PNA.   OT comments  Chart reviewed to date, pt greeted in bed, agreeable to OT tx session targeting improved functional activity tolerance, safe ADL completion. Improvements noted in overall mobility with pt amb to bathroom with RW with CGA-MIN A, frequent vcs for technique/body mechanics. Toilet transfer completed with MIN A and frequent vcs for technique. Pt requires SET UP for grooming tasks seated on edge of bed with good static sitting balance. Pt is making progress towards goals, will continue to benefit from skilled OT to address deficits.       If plan is discharge home, recommend the following:  Two people to help with walking and/or transfers;A lot of help with bathing/dressing/bathroom;Assist for transportation;Help with stairs or ramp for entrance;Assistance with cooking/housework   Equipment Recommendations  BSC/3in1    Recommendations for Other Services      Precautions / Restrictions Precautions Precautions: Fall Restrictions Weight Bearing Restrictions: No       Mobility Bed Mobility Overal bed mobility: Needs Assistance Bed Mobility: Supine to Sit     Supine to sit: Min assist, Used rails          Transfers Overall transfer level: Needs assistance Equipment used: Rolling walker (2 wheels) Transfers: Sit to/from Stand Sit to Stand: Contact guard assist, From elevated surface           General transfer comment: frequent vcs for body mechanics     Balance Overall  balance assessment: Needs assistance Sitting-balance support: Feet supported Sitting balance-Leahy Scale: Good     Standing balance support: No upper extremity supported, During functional activity Standing balance-Leahy Scale: Fair                             ADL either performed or assessed with clinical judgement   ADL Overall ADL's : Needs assistance/impaired     Grooming: Wash/dry hands;Set up;Sitting                   Toilet Transfer: Minimal assistance;Rolling walker (2 wheels);Ambulation;BSC/3in1 Statistician Details (indicate cue type and reason): frequent vcs for technique Toileting- Clothing Manipulation and Hygiene: Moderate assistance;Sit to/from stand       Functional mobility during ADLs: Contact guard assist;Minimal assistance;Cueing for sequencing;Rolling walker (2 wheels) (approx 15' 2 attempts in room)      Extremity/Trunk Assessment              Vision       Perception     Praxis      Cognition Arousal: Alert Behavior During Therapy: WFL for tasks assessed/performed Overall Cognitive Status: Within Functional Limits for tasks assessed Area of Impairment: Problem solving                             Problem Solving: Requires verbal cues (for safe AE use)          Exercises Other Exercises Other Exercises: edu re: safe use of DME at home for safe ADL completion, caregiver use of gait belt to  decrease fall risk    Shoulder Instructions       General Comments vss throughout, noted ?breakdown on back, nurse notified and in room at completion of session    Pertinent Vitals/ Pain       Pain Assessment Pain Assessment: Faces Faces Pain Scale: Hurts a little bit Pain Location: bottom Pain Descriptors / Indicators: Aching, Grimacing Pain Intervention(s): Limited activity within patient's tolerance, Repositioned  Home Living                                          Prior  Functioning/Environment              Frequency  Min 1X/week        Progress Toward Goals  OT Goals(current goals can now be found in the care plan section)  Progress towards OT goals: Progressing toward goals     Plan      Co-evaluation                 AM-PAC OT "6 Clicks" Daily Activity     Outcome Measure   Help from another person eating meals?: None Help from another person taking care of personal grooming?: A Little Help from another person toileting, which includes using toliet, bedpan, or urinal?: A Lot Help from another person bathing (including washing, rinsing, drying)?: A Lot Help from another person to put on and taking off regular upper body clothing?: None Help from another person to put on and taking off regular lower body clothing?: A Lot 6 Click Score: 17    End of Session Equipment Utilized During Treatment: Rolling walker (2 wheels)  OT Visit Diagnosis: Other abnormalities of gait and mobility (R26.89);History of falling (Z91.81)   Activity Tolerance Patient tolerated treatment well   Patient Left with call bell/phone within reach;with family/visitor present;with bed alarm set (on edge of bed with nurse entering room)   Nurse Communication Mobility status        Time: 1610-9604 OT Time Calculation (min): 23 min  Charges: OT General Charges $OT Visit: 1 Visit OT Treatments $Self Care/Home Management : 23-37 mins Oleta Mouse, OTD OTR/L  12/17/22, 12:36 PM

## 2023-01-01 ENCOUNTER — Telehealth: Payer: Self-pay | Admitting: Urology

## 2023-01-01 NOTE — Telephone Encounter (Signed)
Patient's daughter Erling Cruz) called office today. Patient has an appointment on 01/02/23 with Dr. Richardo Hanks, and she would like to know if patient will need to continue taking Flomax along with Proscar. She said he ran out of Flomax. She is not currently on his DPR, but will be added when patient comes in for his appt. She said to just advise patient's wife or caregiver at his appt. She said her mother may forget to ask about medications.

## 2023-01-01 NOTE — Telephone Encounter (Signed)
All urologic medication concerns will be addressed tomorrow at urologic appointment.

## 2023-01-02 ENCOUNTER — Ambulatory Visit: Payer: Medicare HMO | Admitting: Urology

## 2023-01-02 ENCOUNTER — Encounter: Payer: Self-pay | Admitting: Urology

## 2023-01-02 VITALS — BP 174/81 | HR 64 | Ht 72.0 in | Wt 216.0 lb

## 2023-01-02 DIAGNOSIS — C61 Malignant neoplasm of prostate: Secondary | ICD-10-CM

## 2023-01-02 DIAGNOSIS — R339 Retention of urine, unspecified: Secondary | ICD-10-CM

## 2023-01-02 DIAGNOSIS — Z8546 Personal history of malignant neoplasm of prostate: Secondary | ICD-10-CM | POA: Diagnosis not present

## 2023-01-02 DIAGNOSIS — Z87448 Personal history of other diseases of urinary system: Secondary | ICD-10-CM

## 2023-01-02 DIAGNOSIS — Z87898 Personal history of other specified conditions: Secondary | ICD-10-CM

## 2023-01-02 LAB — BLADDER SCAN AMB NON-IMAGING

## 2023-01-02 MED ORDER — FINASTERIDE 5 MG PO TABS: 5.0000 mg | ORAL_TABLET | Freq: Every day | ORAL | 3 refills | Status: DC

## 2023-01-02 MED ORDER — TAMSULOSIN HCL 0.4 MG PO CAPS: 0.4000 mg | ORAL_CAPSULE | Freq: Every day | ORAL | 3 refills | Status: DC

## 2023-01-02 NOTE — Progress Notes (Addendum)
01/02/2023 10:19 AM   Theora Gianotti 1940-09-05 [id number]  Reason for visit: Follow up incomplete bladder emptying, history of prostate cancer  HPI: Very comorbid and frail 82 year old male with distant history of prostate cancer previously treated with radiation.  He originally presented in October 2023 with overflow incontinence(PVR ), worsening renal function with creatinine 2.0, EGFR 33 and bilateral hydronephrosis.  On cystoscopy there was no evidence of urethral stricture, prostate was moderate in size with obstructing lateral lobes, there are moderate bladder trabeculations.  PSA was normal at 0.64.  Prostate measured 60 g on CT.  He did not tolerate Foley catheter well, and ultimately opted for a UroLift.  He was unwilling to accept potential higher risk of incontinence with HOLEP with his history of radiation.  He underwent a UroLift on 02/22/2022, initially passed a voiding trial with a normal PVR of only .  Since that time PVRs have been relatively stable at 300 to 500 mL, and incontinence has improved significantly from prior to UroLift.  PVR today . He had 1 UTI in February 2024.  He has had multiple hospitalizations for unrelated medical issues, he has had CT scans and renal ultrasounds which I personally viewed and interpreted that have shown intermittent mild right-sided hydronephrosis down to the bladder, most likely secondary to his incomplete emptying.  Renal function has been relatively stable and normal, with most recent creatinine of 1.2, EGFR of 60 from September 2024.  I reviewed the hospital notes at length from his recent hospitalization for pneumonia.  He remains on Flomax and finasteride to try to optimize his emptying.  I again had a frank conversation with the patient and his family.  He remains hesitant to undergo any additional procedures like HOLEP that could result in worsening incontinence, unfortunately I think HOLEP would likely have at least a  30 to 40% chance of permanent significant incontinence with his history of radiation, though would likely improve his emptying.  Using shared decision making, he was amenable to considering HOLEP in the future if he develops frank urinary retention, recurrent UTIs, or worsening renal function.  We discussed the importance of continuing Flomax and finasteride to optimize emptying, as well as timed voiding.  Return precautions were discussed extensively.  Continue Flomax and finasteride, refilled Continue Q6 month follow-up for PVR and BMP Consider HOLEP in the future if recurrent UTIs, urinary retention, or worsening renal function  Sondra Come, MD  [facility] [address] [place], [place] [address] [phone]

## 2023-01-02 NOTE — Patient Instructions (Signed)
Continue Flomax(tamsulosin) and Proscar(finasteride)-these help relax and drink your prostate to help you empty as best as possible.  If you start to get recurrent urinary infections, have worsening kidney function, or are unable to urinate, we may need to think about more invasive treatments like HOLEP to remove more prostate tissue to try to help you empty better.  Unfortunately, with your history of radiation, this would have very high risk of permanent leakage and worsening your urinary symptoms.

## 2023-01-10 ENCOUNTER — Other Ambulatory Visit: Payer: Self-pay

## 2023-01-10 MED ORDER — CREON 36000-114000 UNITS PO CPEP
36000.0000 [IU] | ORAL_CAPSULE | Freq: Four times a day (QID) | ORAL | 11 refills | Status: DC
  Filled 2023-01-10: qty 200, 17d supply, fill #0

## 2023-01-15 ENCOUNTER — Other Ambulatory Visit: Payer: Self-pay

## 2023-01-16 ENCOUNTER — Other Ambulatory Visit: Payer: Self-pay

## 2023-01-23 ENCOUNTER — Other Ambulatory Visit: Payer: Self-pay

## 2023-01-23 MED ORDER — PANCRELIPASE (LIP-PROT-AMYL) 36000-114000 UNITS PO CPEP: ORAL_CAPSULE | Freq: Four times a day (QID) | ORAL | 3 refills | Status: DC

## 2023-01-27 ENCOUNTER — Other Ambulatory Visit: Payer: Self-pay

## 2023-01-28 ENCOUNTER — Other Ambulatory Visit: Payer: Self-pay | Admitting: Student

## 2023-01-28 DIAGNOSIS — R55 Syncope and collapse: Secondary | ICD-10-CM

## 2023-01-28 DIAGNOSIS — G20C Parkinsonism, unspecified: Secondary | ICD-10-CM

## 2023-01-30 ENCOUNTER — Other Ambulatory Visit: Payer: Self-pay

## 2023-02-03 ENCOUNTER — Ambulatory Visit
Admission: RE | Admit: 2023-02-03 | Discharge: 2023-02-03 | Disposition: A | Discharge status: Home / Self Care | Payer: Medicare HMO | Source: Ambulatory Visit | Attending: Student | Admitting: Student

## 2023-02-03 DIAGNOSIS — R55 Syncope and collapse: Secondary | ICD-10-CM | POA: Diagnosis present

## 2023-02-03 DIAGNOSIS — G20C Parkinsonism, unspecified: Secondary | ICD-10-CM | POA: Diagnosis present

## 2023-02-13 ENCOUNTER — Other Ambulatory Visit: Payer: Self-pay

## 2023-02-13 ENCOUNTER — Emergency Department: Payer: Medicare HMO

## 2023-02-13 ENCOUNTER — Emergency Department
Admission: EM | Admit: 2023-02-13 | Discharge: 2023-02-13 | Disposition: A | Discharge status: Other Acute Inpatient Hospital | Payer: Medicare HMO | Attending: Student in an Organized Health Care Education/Training Program | Admitting: Student in an Organized Health Care Education/Training Program

## 2023-02-13 ENCOUNTER — Encounter: Payer: Self-pay | Admitting: Medical Oncology

## 2023-02-13 DIAGNOSIS — I671 Cerebral aneurysm, nonruptured: Secondary | ICD-10-CM | POA: Insufficient documentation

## 2023-02-13 DIAGNOSIS — R531 Weakness: Secondary | ICD-10-CM | POA: Diagnosis present

## 2023-02-13 LAB — CBC
HCT: 35.5 % — ABNORMAL LOW (ref 39.0–52.0)
Hemoglobin: 11.4 g/dL — ABNORMAL LOW (ref 13.0–17.0)
MCH: 29.8 pg (ref 26.0–34.0)
MCHC: 32.1 g/dL (ref 30.0–36.0)
MCV: 92.7 fL (ref 80.0–100.0)
Platelets: 179 10*3/uL (ref 150–400)
RBC: 3.83 MIL/uL — ABNORMAL LOW (ref 4.22–5.81)
RDW: 14.1 % (ref 11.5–15.5)
WBC: 4.9 10*3/uL (ref 4.0–10.5)
nRBC: 0 % (ref 0.0–0.2)

## 2023-02-13 LAB — BASIC METABOLIC PANEL
Anion gap: 12 (ref 5–15)
BUN: 48 mg/dL — ABNORMAL HIGH (ref 8–23)
CO2: 25 mmol/L (ref 22–32)
Calcium: 8.5 mg/dL — ABNORMAL LOW (ref 8.9–10.3)
Chloride: 100 mmol/L (ref 98–111)
Creatinine, Ser: 1.86 mg/dL — ABNORMAL HIGH (ref 0.61–1.24)
GFR, Estimated: 35 mL/min — ABNORMAL LOW (ref >60)
Glucose, Bld: 163 mg/dL — ABNORMAL HIGH (ref 70–99)
Potassium: 3.8 mmol/L (ref 3.5–5.1)
Sodium: 137 mmol/L (ref 135–145)

## 2023-02-13 LAB — TROPONIN I (HIGH SENSITIVITY)
Troponin I (High Sensitivity): 18 ng/L — ABNORMAL HIGH (ref <18)
Troponin I (High Sensitivity): 19 ng/L — ABNORMAL HIGH (ref <18)

## 2023-02-13 LAB — LACTIC ACID, PLASMA
Lactic Acid, Venous: 2 mmol/L (ref 0.5–1.9)
Lactic Acid, Venous: 1.8 mmol/L (ref 0.5–1.9)

## 2023-02-13 MED ORDER — SODIUM CHLORIDE 0.9 % IV BOLUS
500.0000 mL | Freq: Once | INTRAVENOUS | Status: AC
  Administered 2023-02-13: 500 mL via INTRAVENOUS

## 2023-02-13 NOTE — ED Notes (Signed)
Accepted to [facility] ED to ED  by Dr. Drinda Butts  per Vernona Rieger,

## 2023-02-13 NOTE — ED Notes (Signed)
Images powershared to [facility]

## 2023-02-13 NOTE — ED Notes (Signed)
Called [facility] and [facility] for transport all trucks out  will try [facility]

## 2023-02-13 NOTE — ED Notes (Signed)
Called [facility] for transport per Dr. Arnoldo Morale  patient is emergent

## 2023-02-13 NOTE — ED Provider Notes (Signed)
[facility] Provider Note    Event Date/Time   First MD Initiated Contact with Patient 02/13/23 [phone]     (approximate)   History   Weakness   HPI  Scott Buckley is a 82 y.o. male with history of known cerebral aneurysm followed by [facility] presents to the ER for evaluation of slurred speech last night as well as weakness intermittent right arm tingling starting last night.  Family reports that he felt warm to touch last night but no measured fevers.  No recent antibiotics.  Denies any dysuria or diarrhea no chest pain or shortness of breath.  Denies any headache.  No visual changes.  States that is typically able to walk with a walker but became more severe today.     Physical Exam   Triage Vital Signs: ED Triage Vitals  Encounter Vitals Group     BP 02/13/23 0917 101/64     Systolic BP Percentile --      Diastolic BP Percentile --      Pulse Rate 02/13/23 0917 98     Resp 02/13/23 0917 17     Temp 02/13/23 0917 98.7 F (37.1 C)     Temp Source 02/13/23 0917 Oral     SpO2 02/13/23 0917 94 %     Weight 02/13/23 0914 215 lb (97.5 kg)     Height 02/13/23 0914 6\' 2"  (1.88 m)     Head Circumference --      Peak Flow --      Pain Score 02/13/23 0914 0     Pain Loc --      Pain Education --      Exclude from Growth Chart --     Most recent vital signs: Vitals:   02/13/23 1318 02/13/23 1321  BP: 114/68   Pulse: 67   Resp: 16   Temp:  97.6 F (36.4 C)  SpO2: 96%      Constitutional: Alert  Eyes: Conjunctivae are normal.  Head: Atraumatic. Nose: No congestion/rhinnorhea. Mouth/Throat: Mucous membranes are moist.   Neck: Painless ROM.  Cardiovascular:   Good peripheral circulation. Respiratory: Normal respiratory effort.  No retractions.  Gastrointestinal: Soft and nontender.  Musculoskeletal:  no deformity Neurologic:  MAE spontaneously.  Does seem to have slight weakness of the right lower extremity as compared to the left.  Good  strength bilateral uppers.  Subjective decrease sensation in the right upper extremity.  No facial droop. Skin:  Skin is warm, dry and intact. No rash noted. Psychiatric: Mood and affect are normal. Speech and behavior are normal.    ED Results / Procedures / Treatments   Labs (all labs ordered are listed, but only abnormal results are displayed) Labs Reviewed  BASIC METABOLIC PANEL - Abnormal; Notable for the following components:      Result Value   Glucose, Bld 163 (*)    BUN 48 (*)    Creatinine, Ser 1.86 (*)    Calcium 8.5 (*)    GFR, Estimated 35 (*)    All other components within normal limits  CBC - Abnormal; Notable for the following components:   RBC 3.83 (*)    Hemoglobin 11.4 (*)    HCT 35.5 (*)    All other components within normal limits  LACTIC ACID, PLASMA - Abnormal; Notable for the following components:   Lactic Acid, Venous 2.0 (*)    All other components within normal limits  TROPONIN I (HIGH SENSITIVITY) - Abnormal; Notable for  the following components:   Troponin I (High Sensitivity) 18 (*)    All other components within normal limits  TROPONIN I (HIGH SENSITIVITY) - Abnormal; Notable for the following components:   Troponin I (High Sensitivity) 19 (*)    All other components within normal limits  LACTIC ACID, PLASMA  URINALYSIS, ROUTINE W REFLEX MICROSCOPIC     EKG ED ECG REPORT I, Willy Eddy, the attending physician, personally viewed and interpreted this ECG.   Date: 02/13/2023  EKG Time: 9:30  Rate: 90  Rhythm: sinus  Axis: normal  Intervals: rbbb  ST&T Change: no stemi, no depresions     RADIOLOGY Please see ED Course for my review and interpretation.  I personally reviewed all radiographic images ordered to evaluate for the above acute complaints and reviewed radiology reports and findings.  These findings were personally discussed with the patient.  Please see medical record for radiology report.    PROCEDURES:  Critical  Care performed: No  Procedures   MEDICATIONS ORDERED IN ED: Medications  sodium chloride 0.9 % bolus 500 mL (0 mLs Intravenous Stopped 02/13/23 1121)  sodium chloride 0.9 % bolus 500 mL (500 mLs Intravenous New Bag/Given 02/13/23 1325)     IMPRESSION / MDM / ASSESSMENT AND PLAN / ED COURSE  I reviewed the triage vital signs and the nursing notes.                              Differential diagnosis includes, but is not limited to, Dehydration, sepsis, pna, uti, hypoglycemia, cva, drug effect  Patient presenting to the ER for evaluation of symptoms as described above.  Based on symptoms, risk factors and considered above differential, this presenting complaint could reflect a potentially life-threatening illness therefore the patient will be placed on continuous pulse oximetry and telemetry for monitoring.  Laboratory evaluation will be sent to evaluate for the above complaints.       Clinical Course as of 02/13/23 1512  Thu Feb 13, 2023  1010 CT head on my review and interpretation with persistent large supra clinoid aneurysm.  No bleed identified. [PR]  1428 Patient still not provided urine does feel improved after IV fluids but based on CT imaging showing large cerebral aneurysm will consult with neurosurgery over at [facility]. [PR]  1438 Repeat lactate improved after IV fluids.  Awaiting callback from [facility]. [PR]  1508 Known cerebral aneurysm - today weakness and could not walk today - R sided weakness and RUE tingling/slurred speech.  Consulted [facility] for transfer.    [SM]    Clinical Course User Index [PR] Willy Eddy, MD [SM] Corena Herter, MD     FINAL CLINICAL IMPRESSION(S) / ED DIAGNOSES   Final diagnoses:  Weakness  Cerebral aneurysm     Rx / DC Orders   ED Discharge Orders     None        Note:  This document was prepared using Dragon voice recognition software and may include unintentional dictation errors.    Willy Eddy, MD 02/13/23  [phone]

## 2023-02-13 NOTE — ED Notes (Signed)
Called [facility] for transfer spoke to [facility] 1433

## 2023-02-13 NOTE — ED Provider Notes (Signed)
Care assumed of patient from outgoing provider.  See their note for initial history, exam and plan.  Clinical Course as of 02/13/23 1605  Thu Feb 13, 2023  1010 CT head on my review and interpretation with persistent large supra clinoid aneurysm.  No bleed identified. [PR]  1428 Patient still not provided urine does feel improved after IV fluids but based on CT imaging showing large cerebral aneurysm will consult with neurosurgery over at [facility]. [PR]  1438 Repeat lactate improved after IV fluids.  Awaiting callback from [facility]. [PR]  1508 Known cerebral aneurysm - today weakness and could not walk today - R sided weakness and RUE tingling/slurred speech.  Consulted [facility] for transfer.    [SM]    Clinical Course User Index [PR] Willy Eddy, MD [SM] Corena Herter, MD   Patient was accepted to [facility], ED to ED   Corena Herter, MD 02/13/23 [phone]

## 2023-02-13 NOTE — ED Triage Notes (Signed)
Pt from home via ems with reports of generalized weakness. States that he usually can use his walker but this morning both of his legs felt too weak to walk. Denies pain. A/O x 4.   CBG 158 112/68

## 2023-03-25 ENCOUNTER — Emergency Department: Payer: Medicare HMO

## 2023-03-25 ENCOUNTER — Other Ambulatory Visit: Payer: Self-pay

## 2023-03-25 ENCOUNTER — Emergency Department
Admission: EM | Admit: 2023-03-25 | Discharge: 2023-03-26 | Disposition: A | Discharge status: Home / Self Care | Payer: Medicare HMO | Attending: Emergency Medicine | Admitting: Emergency Medicine

## 2023-03-25 DIAGNOSIS — M6208 Separation of muscle (nontraumatic), other site: Secondary | ICD-10-CM | POA: Insufficient documentation

## 2023-03-25 DIAGNOSIS — R112 Nausea with vomiting, unspecified: Secondary | ICD-10-CM | POA: Insufficient documentation

## 2023-03-25 DIAGNOSIS — N132 Hydronephrosis with renal and ureteral calculous obstruction: Secondary | ICD-10-CM | POA: Diagnosis not present

## 2023-03-25 DIAGNOSIS — Z20822 Contact with and (suspected) exposure to covid-19: Secondary | ICD-10-CM | POA: Insufficient documentation

## 2023-03-25 DIAGNOSIS — E1122 Type 2 diabetes mellitus with diabetic chronic kidney disease: Secondary | ICD-10-CM | POA: Diagnosis not present

## 2023-03-25 DIAGNOSIS — R531 Weakness: Secondary | ICD-10-CM | POA: Insufficient documentation

## 2023-03-25 DIAGNOSIS — R55 Syncope and collapse: Secondary | ICD-10-CM | POA: Insufficient documentation

## 2023-03-25 DIAGNOSIS — N1831 Chronic kidney disease, stage 3a: Secondary | ICD-10-CM | POA: Diagnosis not present

## 2023-03-25 DIAGNOSIS — R262 Difficulty in walking, not elsewhere classified: Secondary | ICD-10-CM | POA: Insufficient documentation

## 2023-03-25 DIAGNOSIS — I129 Hypertensive chronic kidney disease with stage 1 through stage 4 chronic kidney disease, or unspecified chronic kidney disease: Secondary | ICD-10-CM | POA: Insufficient documentation

## 2023-03-25 DIAGNOSIS — N3289 Other specified disorders of bladder: Secondary | ICD-10-CM | POA: Insufficient documentation

## 2023-03-25 DIAGNOSIS — N400 Enlarged prostate without lower urinary tract symptoms: Secondary | ICD-10-CM | POA: Insufficient documentation

## 2023-03-25 DIAGNOSIS — R109 Unspecified abdominal pain: Secondary | ICD-10-CM | POA: Insufficient documentation

## 2023-03-25 DIAGNOSIS — E119 Type 2 diabetes mellitus without complications: Secondary | ICD-10-CM | POA: Insufficient documentation

## 2023-03-25 DIAGNOSIS — M6281 Muscle weakness (generalized): Secondary | ICD-10-CM | POA: Insufficient documentation

## 2023-03-25 DIAGNOSIS — I700 Atherosclerosis of aorta: Secondary | ICD-10-CM | POA: Insufficient documentation

## 2023-03-25 DIAGNOSIS — R197 Diarrhea, unspecified: Secondary | ICD-10-CM | POA: Diagnosis not present

## 2023-03-25 DIAGNOSIS — I10 Essential (primary) hypertension: Secondary | ICD-10-CM | POA: Insufficient documentation

## 2023-03-25 LAB — RESP PANEL BY RT-PCR (RSV, FLU A&B, COVID)  RVPGX2
Influenza A by PCR: NEGATIVE
Influenza B by PCR: NEGATIVE
Resp Syncytial Virus by PCR: NEGATIVE
SARS Coronavirus 2 by RT PCR: NEGATIVE

## 2023-03-25 LAB — URINALYSIS, ROUTINE W REFLEX MICROSCOPIC
Bilirubin Urine: NEGATIVE
Glucose, UA: NEGATIVE mg/dL
Hgb urine dipstick: NEGATIVE
Ketones, ur: NEGATIVE mg/dL
Leukocytes,Ua: NEGATIVE
Nitrite: NEGATIVE
Protein, ur: NEGATIVE mg/dL
Specific Gravity, Urine: 1.017 (ref 1.005–1.030)
pH: 5 (ref 5.0–8.0)

## 2023-03-25 LAB — CBC
HCT: 37.4 % — ABNORMAL LOW (ref 39.0–52.0)
Hemoglobin: 11.9 g/dL — ABNORMAL LOW (ref 13.0–17.0)
MCH: 29.8 pg (ref 26.0–34.0)
MCHC: 31.8 g/dL (ref 30.0–36.0)
MCV: 93.5 fL (ref 80.0–100.0)
Platelets: 193 10*3/uL (ref 150–400)
RBC: 4 MIL/uL — ABNORMAL LOW (ref 4.22–5.81)
RDW: 14.3 % (ref 11.5–15.5)
WBC: 10.3 10*3/uL (ref 4.0–10.5)
nRBC: 0 % (ref 0.0–0.2)

## 2023-03-25 LAB — COMPREHENSIVE METABOLIC PANEL
ALT: 11 U/L (ref 0–44)
AST: 19 U/L (ref 15–41)
Albumin: 3.5 g/dL (ref 3.5–5.0)
Alkaline Phosphatase: 51 U/L (ref 38–126)
Anion gap: 12 (ref 5–15)
BUN: 32 mg/dL — ABNORMAL HIGH (ref 8–23)
CO2: 24 mmol/L (ref 22–32)
Calcium: 8.9 mg/dL (ref 8.9–10.3)
Chloride: 102 mmol/L (ref 98–111)
Creatinine, Ser: 1.55 mg/dL — ABNORMAL HIGH (ref 0.61–1.24)
GFR, Estimated: 44 mL/min — ABNORMAL LOW (ref >60)
Glucose, Bld: 150 mg/dL — ABNORMAL HIGH (ref 70–99)
Potassium: 4.1 mmol/L (ref 3.5–5.1)
Sodium: 138 mmol/L (ref 135–145)
Total Bilirubin: 1 mg/dL (ref <1.2)
Total Protein: 6.9 g/dL (ref 6.5–8.1)

## 2023-03-25 LAB — CBG MONITORING, ED: Glucose-Capillary: 144 mg/dL — ABNORMAL HIGH (ref 70–99)

## 2023-03-25 LAB — LIPASE, BLOOD: Lipase: 24 U/L (ref 11–51)

## 2023-03-25 MED ORDER — IOHEXOL 300 MG/ML  SOLN
80.0000 mL | Freq: Once | INTRAMUSCULAR | Status: AC | PRN
  Administered 2023-03-25: 80 mL via INTRAVENOUS

## 2023-03-25 MED ORDER — LACTATED RINGERS IV BOLUS
1000.0000 mL | Freq: Once | INTRAVENOUS | Status: AC
  Administered 2023-03-25: 1000 mL via INTRAVENOUS

## 2023-03-25 MED ORDER — ONDANSETRON HCL 4 MG PO TABS
4.0000 mg | ORAL_TABLET | Freq: Three times a day (TID) | ORAL | Status: DC | PRN
Start: 2023-03-25 — End: 2023-03-26

## 2023-03-25 NOTE — ED Notes (Addendum)
Per Wife's request. RN checked CBG.

## 2023-03-25 NOTE — ED Triage Notes (Signed)
Pt to ED via [facility] from home. Pt reports N/V/D and chills x2 days.   EMS VS: 120/61 99.1 HR 710 RR 18

## 2023-03-25 NOTE — ED Notes (Signed)
Attempted to obtain urine sample from patient. Patient stated "I don't have to pee right now".

## 2023-03-25 NOTE — ED Notes (Signed)
ED Provider at bedside. 

## 2023-03-25 NOTE — ED Provider Notes (Signed)
[facility] Provider Note   Event Date/Time   First MD Initiated Contact with Patient 03/25/23 1740     (approximate) History  No chief complaint on file.  HPI Scott Buckley is a 82 y.o. male with a stated past medical history of type 2 diabetes, hypertension, and renal disorder who presents complaining of of generalized weakness after 24 hours of nausea/vomiting/diarrhea.  Patient states that the symptoms have resolved at this time however he is unable to care for himself at home and is currently being cared for by an 82 year old caregiver who is unable to help him around the house. ROS: Patient currently denies any vision changes, tinnitus, difficulty speaking, facial droop, sore throat, chest pain, shortness of breath, abdominal pain, nausea/vomiting/diarrhea, dysuria, or numbness/paresthesias in any extremity   Physical Exam  Triage Vital Signs: ED Triage Vitals [03/25/23 1104]  Encounter Vitals Group     BP (!) 102/56     Systolic BP Percentile      Diastolic BP Percentile      Pulse Rate 91     Resp 20     Temp 99.7 F (37.6 C)     Temp Source Oral     SpO2 98 %     Weight      Height      Head Circumference      Peak Flow      Pain Score 3     Pain Loc      Pain Education      Exclude from Growth Chart    Most recent vital signs: Vitals:   03/25/23 2200 03/25/23 2230  BP: 120/70 109/72  Pulse: 70 71  Resp: 13 13  Temp:    SpO2: 97% 97%   General: Awake, oriented x4. CV:  Good peripheral perfusion.  Resp:  Normal effort.  Abd:  No distention.  Other:  Elderly well-developed, well-nourished Caucasian male resting comfortably in no acute distress.  Patient unable to stand on his own ED Results / Procedures / Treatments  Labs (all labs ordered are listed, but only abnormal results are displayed) Labs Reviewed  COMPREHENSIVE METABOLIC PANEL - Abnormal; Notable for the following components:      Result Value   Glucose, Bld 150 (*)     BUN 32 (*)    Creatinine, Ser 1.55 (*)    GFR, Estimated 44 (*)    All other components within normal limits  CBC - Abnormal; Notable for the following components:   RBC 4.00 (*)    Hemoglobin 11.9 (*)    HCT 37.4 (*)    All other components within normal limits  URINALYSIS, ROUTINE W REFLEX MICROSCOPIC - Abnormal; Notable for the following components:   Color, Urine YELLOW (*)    APPearance HAZY (*)    All other components within normal limits  CBG MONITORING, ED - Abnormal; Notable for the following components:   Glucose-Capillary 144 (*)    All other components within normal limits  RESP PANEL BY RT-PCR (RSV, FLU A&B, COVID)  RVPGX2  LIPASE, BLOOD   RADIOLOGY ED MD interpretation: CT of the abdomen and pelvis with IV contrast interpreted independently and shows stable moderate right and new mild left hydroureteronephrosis to the level of the bladder with a tiny 1 mm layering calculi in the distal right ureter.  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 PROCEDURES: Critical Care performed: No .1-3 Lead EKG Interpretation  Performed by: Merwyn Katos, MD Authorized by: Merwyn Katos, MD     Interpretation: normal     ECG rate:  71   ECG rate assessment: normal     Rhythm: sinus rhythm     Ectopy: none     Conduction: normal    MEDICATIONS ORDERED IN ED: Medications  ondansetron (ZOFRAN) tablet 4 mg (has no administration in time range)  lactated ringers bolus 1,000 mL (0 mLs Intravenous Stopped 03/25/23 2222)  iohexol (OMNIPAQUE) 300 MG/ML solution 80 mL (80 mLs Intravenous Contrast Given 03/25/23 2038)   IMPRESSION / MDM / ASSESSMENT AND PLAN / ED COURSE  I reviewed the triage vital signs and the nursing notes.                             The patient is on the cardiac monitor to evaluate  for evidence of arrhythmia and/or significant heart rate changes. Patient's presentation is most consistent with acute presentation with potential threat to life or bodily function. Patient presents for acute nausea/vomiting The cause of the patient's symptoms is not clear, but the patient is overall well appearing and is suspected to have a transient course of illness.  Given History and Exam there does not appear to be an emergent cause of the symptoms such as small bowel obstruction, coronary syndrome, bowel ischemia, DKA, pancreatitis, appendicitis, other acute abdomen or other emergent problem.  Reassessment: After treatment, the patient is feeling much better, tolerating PO fluids, and shows no signs of dehydration however, patient is still unable to take care of himself including being unable to walk without the assistance of at least 2-3 other people.  Given the symptoms, patient will require PT and OT evaluation for possible placement in a higher level of care.  Disposition: [facility]   FINAL CLINICAL IMPRESSION(S) / ED DIAGNOSES   Final diagnoses:  Nausea vomiting and diarrhea  Generalized weakness   Rx / DC Orders   ED Discharge Orders     None      Note:  This document was prepared using Dragon voice recognition software and may include unintentional dictation errors.   Merwyn Katos, MD 03/25/23 [phone]

## 2023-03-26 NOTE — ED Notes (Signed)
Patient was cleaned after having a large bowel movement. Linens changed as well. Pants that were soiled are in a bag on the bag of his bed.

## 2023-03-26 NOTE — Evaluation (Signed)
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  Recently admitted for management of hypovolemic shock and sepsis related to suspected aspiration PNA.  Now here after nausea, vomiting and weakness   Clinical Impression   Pt seen for OT evaluation. Pt pleasant, agreeable, and follows commands with increased processing time noted. Pt completes ADL transfers and mobility with some effort and increased time, CGA with RW and fair balance, no notable LOB. Pt requires MIN A for LB ADL tasks. Wife present near end of session, very pleased to learn he was able to walk without assistance. She notes it took 4 people to get him up out of the chair at home to come here 2/2 weakness. Pt reports feeling much better. Pt and wife educated in [facility] use overnight to minimize falls risk, as wife endorses pt has diarrhea occasionally. Recommend BSC and additional skilled OT services to maximize pt's independence and safety at home.        If plan is discharge home, recommend the following: A little help with walking and/or transfers;A little help with bathing/dressing/bathroom;Assistance with cooking/housework;Assist for transportation;Help with stairs or ramp for entrance    Functional Status Assessment  Patient has had a recent decline in their functional status and demonstrates the ability to make significant improvements in function in a reasonable and predictable amount of time.  Equipment Recommendations  BSC/3in1    Recommendations for Other Services       Precautions / Restrictions Precautions Precautions: Fall Restrictions Weight Bearing  Restrictions Per Provider Order: No      Mobility Bed Mobility Overal bed mobility: Needs Assistance Bed Mobility: Supine to Sit, Sit to Supine     Supine to sit: Contact guard Sit to supine: Min assist   General bed mobility comments: slow transitions, increased time/effort    Transfers Overall transfer level: Needs assistance Equipment used: Rolling walker (2 wheels) Transfers: Sit to/from Stand Sit to Stand: Contact guard assist                  Balance Overall balance assessment: Needs assistance Sitting-balance support: No upper extremity supported, Feet supported, Single extremity supported Sitting balance-Leahy Scale: Fair     Standing balance support: Bilateral upper extremity supported Standing balance-Leahy Scale: Fair                             ADL either performed or assessed with clinical judgement   ADL                                         General ADL Comments: Pt requires MIN A for LB ADL tasks, CGA to complete LB dressing over hips in standing. CGA for ADL transfers and mobility with 2WW     Vision         Perception         Praxis         Pertinent Vitals/Pain Pain Assessment Pain Assessment: No/denies pain     Extremity/Trunk Assessment Upper Extremity Assessment Upper Extremity Assessment: Generalized weakness   Lower Extremity Assessment Lower  Extremity Assessment: Generalized weakness       Communication Communication Communication: Difficulty following commands/understanding Following commands: Follows one step commands with increased time   Cognition Arousal: Alert Behavior During Therapy: WFL for tasks assessed/performed Overall Cognitive Status: No family/caregiver present to determine baseline cognitive functioning                                 General Comments: Alert, pleasant, follows commands well, delayed problem solving noted (unsure if this is baseline)      General Comments  Pt displayed some age appropriate weakness and had slight difficulty getting LEs back up into bed post ambulation but ultimately moved relatively well and reports feeling close to his basleine    Exercises Other Exercises Other Exercises: Pt and wife educated in [facility] use overnight to minimize falls risk, as wife endorses pt has diarrhea occasionally   Shoulder Instructions      Home Living Family/patient expects to be discharged to:: Private residence Living Arrangements: Spouse/significant other;Other relatives Available Help at Discharge: Family;Available 24 hours/day Type of Home: House Home Access: Stairs to enter [employer] of Steps: 2 per wife Entrance Stairs-Rails: Right Home Layout: Two level;Able to live on main level with bedroom/bathroom     Bathroom Shower/Tub: Arts development officer Toilet: Handicapped height     Home Equipment: Agricultural consultant (2 wheels);Cane - single point;Grab bars - tub/shower;Shower seat - built in   Additional Comments: wife notes she has a BSC but pt does not      Prior Functioning/Environment Prior Level of Function : Independent/Modified Independent             Mobility Comments: Mod Ind amb with a RW in the home and a SPC in the community, able to amb community distances, endorses fall history in past 74mo 2/2 turning too quickly and losing balance. Wife reports need to help push him up the 2nd step to get into the house in the past 3 weeks, but was able to without her held prior to that. ADLs Comments: Occasional min A with donning socks and drying back after bathing. Wife reports increased difficulty with bathing.        OT Problem List: Decreased strength;Decreased activity tolerance;Impaired balance (sitting and/or standing);Decreased knowledge of use of DME or AE      OT Treatment/Interventions: Self-care/ADL training;Therapeutic exercise;Therapeutic activities;Patient/family education;DME  and/or AE instruction;Balance training    OT Goals(Current goals can be found in the care plan section) Acute Rehab OT Goals Patient Stated Goal: get therapy at home to get better OT Goal Formulation: With patient/family Time For Goal Achievement: 04/09/23 Potential to Achieve Goals: Good ADL Goals Pt Will Perform Lower Body Dressing: with modified independence;sit to/from stand Pt Will Transfer to Toilet: with modified independence;ambulating (LRAD) Pt Will Perform Toileting - Clothing Manipulation and hygiene: with modified independence Additional ADL Goal #1: Pt/family will verbalize plan to implement at least 1 learned falls prevention strategy to maximize safety.  OT Frequency: Min 1X/week    Co-evaluation              AM-PAC OT "6 Clicks" Daily Activity     Outcome Measure Help from another person eating meals?: None Help from another person taking care of personal grooming?: None Help from another person toileting, which includes using toliet, bedpan, or urinal?: A Little Help from another person bathing (including washing, rinsing, drying)?: A Little Help from another person  to put on and taking off regular upper body clothing?: None Help from another person to put on and taking off regular lower body clothing?: A Little 6 Click Score: 21   End of Session Equipment Utilized During Treatment: Rolling walker (2 wheels) Nurse Communication: Mobility status  Activity Tolerance: Patient tolerated treatment well Patient left: in bed;with call bell/phone within reach;with family/visitor present  OT Visit Diagnosis: Other abnormalities of gait and mobility (R26.89);Muscle weakness (generalized) (M62.81);History of falling (Z91.81)                Time: 2956-2130 OT Time Calculation (min): 19 min Charges:  OT General Charges $OT Visit: 1 Visit OT Evaluation $OT Eval Low Complexity: 1 Low OT Treatments $Self Care/Home Management : 8-22 mins  Arman Filter., MPH, MS,  OTR/L ascom [phone] 03/26/23, 11:29 AM

## 2023-03-26 NOTE — ED Provider Notes (Signed)
-----------------------------------------   5:51 AM on 03/26/2023 -----------------------------------------   Blood pressure (!) 168/82, pulse 61, temperature 97.9 F (36.6 C), temperature source Oral, resp. rate 15, SpO2 100%.  The patient is calm and cooperative at this time.  There have been no acute events since the last update.  Awaiting disposition plan from Social Work team.   Irean Hong, MD 03/26/23 [phone]

## 2023-03-26 NOTE — ED Notes (Signed)
RN

## 2023-03-26 NOTE — ED Provider Notes (Signed)
Pt cleared by SW for dc- was up and ambulatory D/w wife and patient and agreeable with plan.    Concha Se, MD 03/26/23 1226

## 2023-03-26 NOTE — Discharge Instructions (Signed)
Cleared by SW/PT for discharge home. Return for any other concerns

## 2023-03-26 NOTE — ED Notes (Signed)
Patient had incontinence of bowels. Patient ambulated to restroom with walker, got cleaned up and got clean brief and and pants.  Patient tolerated well.

## 2023-03-26 NOTE — ED Notes (Signed)
PT with patient

## 2023-03-26 NOTE — Evaluation (Signed)
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  Recently admitted for management of hypovolemic shock and sepsis related to suspected aspiration PNA.  Now here with weakness after episode of nausea, vomiting.  Clinical Impression  Pt pleasant and willing to work with PT, had some general confusion that did appear to improve as he woke up more.  He was able to move relatively well from supine to sit, and then stood w/o assist ambulated ~200 ft with walker and did not have any LOBs or overt safety issues.  Pt with some difficulty getting LEs back up into bed after the effort, reports he sleeps in recliner and does not typically have to do so.  Pt reports feeling close to but weaker than his baseline.  He will benefit from continued PT to address functional limitations.       If plan is discharge home, recommend the following: Help with stairs or ramp for entrance;Assistance with cooking/housework;A little help with walking and/or transfers;A little help with bathing/dressing/bathroom   Can travel by private vehicle        Equipment Recommendations None recommended by PT  Recommendations for Other Services       Functional Status Assessment Patient has had a recent decline in their functional status and demonstrates the ability to make significant improvements in function in a reasonable and predictable amount of time.     Precautions / Restrictions Precautions Precautions: Fall Restrictions Weight Bearing Restrictions Per Provider Order: No      Mobility  Bed Mobility Overal bed mobility: Needs Assistance Bed Mobility: Supine to  Sit, Sit to Supine     Supine to sit: Contact guard Sit to supine: Min assist   General bed mobility comments: Pt slow with transitions but was able to go supine to sit w/o phyiscal assist, extra time and effort for getting back up into bed, ultimately needing light assist to get LEs fully up into bed    Transfers Overall transfer level: Needs assistance Equipment used: Rolling walker (2 wheels) Transfers: Sit to/from Stand Sit to Stand: Contact guard assist           General transfer comment: On first attempt pt leaning back of legs aggressively on bed, struggled with forward weight shift - on second attempt he did well and managed to transition to standing in walker w/o assist or safety concerns    Ambulation/Gait Ambulation/Gait assistance: Contact guard assist Gait Distance (Feet): 180 Feet Assistive device: Rolling walker (2 wheels)         General Gait Details: Pt was able to take relatively confident and consistent steps with appropriate walker use and with multiple 180* turns w/o LOBs, minimal need for more than directional cuing.  Stairs            Wheelchair Mobility     Tilt Bed    Modified Rankin (Stroke Patients Only)       Balance Overall balance assessment: Modified Independent                                           Pertinent Vitals/Pain Pain Assessment  Pain Assessment: No/denies pain    Home Living Family/patient expects to be discharged to:: Private residence Living Arrangements: Spouse/significant other;Other relatives Available Help at Discharge: Family;Available 24 hours/day Type of Home: House Home Access: Stairs to enter Entrance Stairs-Rails: Right Entrance Stairs-Number of Steps: 1   Home Layout: Two level;Able to live on main level with bedroom/bathroom Home Equipment: Rolling Walker (2 wheels);Cane - single point;BSC/3in1;Grab bars - tub/shower;Shower seat - built in      Prior Function Prior Level  of Function : Independent/Modified Independent             Mobility Comments: Mod Ind amb with a RW in the home and a SPC in the community, able to amb community distances, does endorse previous fall history, but not recently ADLs Comments: Occasional min A with donning socks and drying back after bathing     Extremity/Trunk Assessment   Upper Extremity Assessment Upper Extremity Assessment: Generalized weakness    Lower Extremity Assessment Lower Extremity Assessment: Generalized weakness       Communication   Communication Communication: Difficulty following commands/understanding Following commands: Follows one step commands consistently  Cognition Arousal: Alert   Overall Cognitive Status: Within Functional Limits for tasks assessed                                 General Comments: pt was aware of location, confused on date, consistently off by 1 digit on his birthday (correct when repeatedly cued)        General Comments General comments (skin integrity, edema, etc.): Pt displayed some age appropriate weakness and had slight difficulty getting LEs back up into bed post ambulation but ultimately moved relatively well and reports feeling close to his basleine    Exercises     Assessment/Plan    PT Assessment Patient needs continued PT services  PT Problem List Decreased strength;Decreased activity tolerance;Decreased range of motion;Decreased balance;Decreased mobility;Decreased knowledge of precautions;Decreased knowledge of use of DME;Decreased safety awareness;Decreased cognition       PT Treatment Interventions DME instruction;Functional mobility training;Therapeutic activities;Therapeutic exercise;Balance training;Neuromuscular re-education;Cognitive remediation;Patient/family education    PT Goals (Current goals can be found in the Care Plan section)  Acute Rehab PT Goals Patient Stated Goal: go home PT Goal Formulation: With patient Time  For Goal Achievement: 04/08/23 Potential to Achieve Goals: Good    Frequency Min 1X/week     Co-evaluation               AM-PAC PT "6 Clicks" Mobility  Outcome Measure Help needed turning from your back to your side while in a flat bed without using bedrails?: A Little Help needed moving from lying on your back to sitting on the side of a flat bed without using bedrails?: A Little Help needed moving to and from a bed to a chair (including a wheelchair)?: A Little Help needed standing up from a chair using your arms (e.g., wheelchair or bedside chair)?: A Little Help needed to walk in hospital room?: A Little Help needed climbing 3-5 steps with a railing? : A Little 6 Click Score: 18    End of Session Equipment Utilized During Treatment: Gait belt Activity Tolerance: Patient tolerated treatment well Patient left: with call bell/phone within reach;in bed (ED hallway) Nurse Communication: Mobility status PT Visit Diagnosis: Muscle weakness (generalized) (M62.81);Difficulty in walking, not elsewhere classified (R26.2)    Time: 1610-9604 PT Time Calculation (min) (ACUTE ONLY): 21 min  Charges:   PT Evaluation $PT Eval Low Complexity: 1 Low PT Treatments $Gait Training: 8-22 mins PT General Charges $$ ACUTE PT VISIT: 1 Visit         Malachi Pro, DPT 03/26/2023, 10:36 AM

## 2023-03-26 NOTE — ED Notes (Signed)
Patient resting quietly at this time.  Watching TV.  No needs expressed.

## 2023-04-01 ENCOUNTER — Inpatient Hospital Stay
Admission: EM | Admit: 2023-04-01 | Discharge: 2023-04-09 | DRG: 951 | Disposition: E | Payer: Medicare HMO | Attending: Internal Medicine | Admitting: Internal Medicine

## 2023-04-01 ENCOUNTER — Emergency Department: Payer: Medicare HMO

## 2023-04-01 DIAGNOSIS — Z85820 Personal history of malignant melanoma of skin: Secondary | ICD-10-CM

## 2023-04-01 DIAGNOSIS — I606 Nontraumatic subarachnoid hemorrhage from other intracranial arteries: Principal | ICD-10-CM | POA: Diagnosis present

## 2023-04-01 DIAGNOSIS — J449 Chronic obstructive pulmonary disease, unspecified: Secondary | ICD-10-CM | POA: Diagnosis present

## 2023-04-01 DIAGNOSIS — N400 Enlarged prostate without lower urinary tract symptoms: Secondary | ICD-10-CM | POA: Diagnosis present

## 2023-04-01 DIAGNOSIS — I615 Nontraumatic intracerebral hemorrhage, intraventricular: Secondary | ICD-10-CM | POA: Diagnosis present

## 2023-04-01 DIAGNOSIS — N183 Chronic kidney disease, stage 3 unspecified: Secondary | ICD-10-CM | POA: Diagnosis present

## 2023-04-01 DIAGNOSIS — Z66 Do not resuscitate: Secondary | ICD-10-CM | POA: Diagnosis present

## 2023-04-01 DIAGNOSIS — G20A1 Parkinson's disease without dyskinesia, without mention of fluctuations: Secondary | ICD-10-CM | POA: Diagnosis present

## 2023-04-01 DIAGNOSIS — K8689 Other specified diseases of pancreas: Secondary | ICD-10-CM | POA: Diagnosis present

## 2023-04-01 DIAGNOSIS — G9349 Other encephalopathy: Secondary | ICD-10-CM | POA: Diagnosis present

## 2023-04-01 DIAGNOSIS — Z888 Allergy status to other drugs, medicaments and biological substances status: Secondary | ICD-10-CM

## 2023-04-01 DIAGNOSIS — I671 Cerebral aneurysm, nonruptured: Secondary | ICD-10-CM | POA: Diagnosis present

## 2023-04-01 DIAGNOSIS — R4020 Unspecified coma: Secondary | ICD-10-CM | POA: Diagnosis present

## 2023-04-01 DIAGNOSIS — G914 Hydrocephalus in diseases classified elsewhere: Secondary | ICD-10-CM | POA: Diagnosis present

## 2023-04-01 DIAGNOSIS — Z887 Allergy status to serum and vaccine status: Secondary | ICD-10-CM

## 2023-04-01 DIAGNOSIS — I2510 Atherosclerotic heart disease of native coronary artery without angina pectoris: Secondary | ICD-10-CM | POA: Diagnosis present

## 2023-04-01 DIAGNOSIS — Z79899 Other long term (current) drug therapy: Secondary | ICD-10-CM

## 2023-04-01 DIAGNOSIS — J9601 Acute respiratory failure with hypoxia: Secondary | ICD-10-CM | POA: Diagnosis present

## 2023-04-01 DIAGNOSIS — I609 Nontraumatic subarachnoid hemorrhage, unspecified: Secondary | ICD-10-CM | POA: Diagnosis not present

## 2023-04-01 DIAGNOSIS — I129 Hypertensive chronic kidney disease with stage 1 through stage 4 chronic kidney disease, or unspecified chronic kidney disease: Secondary | ICD-10-CM | POA: Diagnosis present

## 2023-04-01 DIAGNOSIS — Z833 Family history of diabetes mellitus: Secondary | ICD-10-CM

## 2023-04-01 DIAGNOSIS — R29730 NIHSS score 30: Secondary | ICD-10-CM | POA: Diagnosis present

## 2023-04-01 DIAGNOSIS — Z87891 Personal history of nicotine dependence: Secondary | ICD-10-CM

## 2023-04-01 DIAGNOSIS — F0280 Dementia in other diseases classified elsewhere without behavioral disturbance: Secondary | ICD-10-CM | POA: Diagnosis present

## 2023-04-01 DIAGNOSIS — Z882 Allergy status to sulfonamides status: Secondary | ICD-10-CM

## 2023-04-01 DIAGNOSIS — Z8546 Personal history of malignant neoplasm of prostate: Secondary | ICD-10-CM

## 2023-04-01 DIAGNOSIS — Z96652 Presence of left artificial knee joint: Secondary | ICD-10-CM | POA: Diagnosis present

## 2023-04-01 DIAGNOSIS — R404 Transient alteration of awareness: Secondary | ICD-10-CM | POA: Diagnosis present

## 2023-04-01 DIAGNOSIS — Z8249 Family history of ischemic heart disease and other diseases of the circulatory system: Secondary | ICD-10-CM | POA: Diagnosis not present

## 2023-04-01 DIAGNOSIS — Z515 Encounter for palliative care: Secondary | ICD-10-CM | POA: Diagnosis not present

## 2023-04-01 DIAGNOSIS — E785 Hyperlipidemia, unspecified: Secondary | ICD-10-CM | POA: Diagnosis present

## 2023-04-01 DIAGNOSIS — Z96643 Presence of artificial hip joint, bilateral: Secondary | ICD-10-CM | POA: Diagnosis present

## 2023-04-01 DIAGNOSIS — Z83438 Family history of other disorder of lipoprotein metabolism and other lipidemia: Secondary | ICD-10-CM

## 2023-04-01 DIAGNOSIS — E1122 Type 2 diabetes mellitus with diabetic chronic kidney disease: Secondary | ICD-10-CM | POA: Diagnosis present

## 2023-04-01 DIAGNOSIS — I480 Paroxysmal atrial fibrillation: Secondary | ICD-10-CM | POA: Diagnosis present

## 2023-04-01 LAB — CBC
HCT: 35.8 % — ABNORMAL LOW (ref 39.0–52.0)
Hemoglobin: 11.5 g/dL — ABNORMAL LOW (ref 13.0–17.0)
MCH: 29.6 pg (ref 26.0–34.0)
MCHC: 32.1 g/dL (ref 30.0–36.0)
MCV: 92.3 fL (ref 80.0–100.0)
Platelets: 235 10*3/uL (ref 150–400)
RBC: 3.88 MIL/uL — ABNORMAL LOW (ref 4.22–5.81)
RDW: 14.2 % (ref 11.5–15.5)
WBC: 15.9 10*3/uL — ABNORMAL HIGH (ref 4.0–10.5)
nRBC: 0 % (ref 0.0–0.2)

## 2023-04-01 LAB — COMPREHENSIVE METABOLIC PANEL
ALT: 14 U/L (ref 0–44)
AST: 23 U/L (ref 15–41)
Albumin: 3.3 g/dL — ABNORMAL LOW (ref 3.5–5.0)
Alkaline Phosphatase: 53 U/L (ref 38–126)
Anion gap: 11 (ref 5–15)
BUN: 22 mg/dL (ref 8–23)
CO2: 24 mmol/L (ref 22–32)
Calcium: 8.3 mg/dL — ABNORMAL LOW (ref 8.9–10.3)
Chloride: 106 mmol/L (ref 98–111)
Creatinine, Ser: 1.23 mg/dL (ref 0.61–1.24)
GFR, Estimated: 58 mL/min — ABNORMAL LOW (ref >60)
Glucose, Bld: 186 mg/dL — ABNORMAL HIGH (ref 70–99)
Potassium: 2.9 mmol/L — ABNORMAL LOW (ref 3.5–5.1)
Sodium: 141 mmol/L (ref 135–145)
Total Bilirubin: 0.6 mg/dL (ref <1.2)
Total Protein: 6.4 g/dL — ABNORMAL LOW (ref 6.5–8.1)

## 2023-04-01 LAB — DIFFERENTIAL
Abs Immature Granulocytes: 0.27 10*3/uL — ABNORMAL HIGH (ref 0.00–0.07)
Basophils Absolute: 0.1 10*3/uL (ref 0.0–0.1)
Basophils Relative: 0 %
Eosinophils Absolute: 0.2 10*3/uL (ref 0.0–0.5)
Eosinophils Relative: 1 %
Immature Granulocytes: 2 %
Lymphocytes Relative: 24 %
Lymphs Abs: 3.8 10*3/uL (ref 0.7–4.0)
Monocytes Absolute: 1 10*3/uL (ref 0.1–1.0)
Monocytes Relative: 6 %
Neutro Abs: 10.6 10*3/uL — ABNORMAL HIGH (ref 1.7–7.7)
Neutrophils Relative %: 67 %

## 2023-04-01 LAB — ETHANOL: Alcohol, Ethyl (B): <10 mg/dL (ref <10)

## 2023-04-01 LAB — PROTIME-INR
INR: 1.2 (ref 0.8–1.2)
Prothrombin Time: 15.1 s (ref 11.4–15.2)

## 2023-04-01 LAB — APTT: aPTT: 28 s (ref 24–36)

## 2023-04-01 MED ORDER — ETOMIDATE 2 MG/ML IV SOLN
INTRAVENOUS | Status: AC | PRN
  Administered 2023-04-01: 20 mg via INTRAVENOUS

## 2023-04-01 MED ORDER — FENTANYL CITRATE (PF) 100 MCG/2ML IJ SOLN: INTRAMUSCULAR | Status: AC | PRN

## 2023-04-01 MED ORDER — CLEVIDIPINE BUTYRATE 0.5 MG/ML IV EMUL
0.0000 mg/h | INTRAVENOUS | Status: DC
  Filled 2023-04-01: qty 50

## 2023-04-01 MED ORDER — NIMODIPINE 30 MG PO CAPS
60.0000 mg | ORAL_CAPSULE | Freq: Once | ORAL | Status: DC
Start: 2023-04-01 — End: 2023-04-01
  Filled 2023-04-01: qty 2

## 2023-04-01 MED ORDER — IOHEXOL 350 MG/ML SOLN
75.0000 mL | Freq: Once | INTRAVENOUS | Status: AC | PRN
  Administered 2023-04-01: 75 mL via INTRAVENOUS

## 2023-04-01 MED ORDER — IOHEXOL 350 MG/ML SOLN: 75.0000 mL | Freq: Once | INTRAVENOUS | Status: DC | PRN

## 2023-04-01 MED ORDER — ROCURONIUM BROMIDE 10 MG/ML (PF) SYRINGE
PREFILLED_SYRINGE | INTRAVENOUS | Status: AC | PRN
  Administered 2023-04-01: 80 mg via INTRAVENOUS

## 2023-04-01 NOTE — Progress Notes (Signed)
  Chaplain On-Call responded to Code Stroke notification at 1903 hours.  The patient was at the CT Scan area for tests. No family is present.  Chaplain assured Staff of availability as needed.  Chaplain Evelena Peat M.Div., [facility]

## 2023-04-01 NOTE — ED Notes (Signed)
80mg  of roc given

## 2023-04-01 NOTE — IPAL (Signed)
  INTERDISCIPLINARY GOALS OF CARE FAMILY MEETING  Patient Name: Scott Buckley   MRN: [id number]   Date of Birth/ Sex: Dec 09, 1940 , male      Admission Date: 04/01/2023  Attending Provider: Sharman Cheek, MD  Primary Diagnosis: Ruptured Cerebral Aneurysm     Date carried out: 04/01/23 Location of the meeting: Bedside   Member's involved: NP and Family Member or next of kin Wife and son   Durable Power of Pensions consultant or Environmental health practitioner:Scott Buckley, Scott Buckley (wife)   Discussion:  Advance Care Planning/Goals of Care discussion was performed during the course of treatment to decide on type of care right for this patient following following presentation to the ED.   I met with patient's wife and son to discuss goals of care in details following  acute change in patient's current status. 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 I also discussed with family that due to ICH patient not a candidate for thrombolytics or thrombectomy per neurologist recommendations. Discussed likelihood of progression to brain death/herniation with overall poor neurological outcome with the family at the bedside and answered all their question.   Discussed poor prognosis, expected outcome with or without ongoing aggressive treatments and the options for de-escalation of care.   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 Prognosis: Poor Code Status: DNR Disposition: ICU Next Steps:  Family understands the situation. They have consented and agreed to DNR/DNI and would not wish to pursue any aggressive treatment.  Patient's family would like to proceed with full comfort care including terminal extubation.   Family are  satisfied with Plan of action and management. All questions answered     Total Time Spent Face to Face addressing advance care planning in the presence of the Patient: 35 minutes       Webb Silversmith, DNP, FNP-C, AGACNP-BC Acute Care Nurse Practitioner  Pulmonary & Critical Care Medicine Pager: [phone] [facility]

## 2023-04-01 NOTE — Consult Note (Signed)
[facility] Telemedicine Consult   Requesting Provider: Scotty Court Consult Participants: Nurse, EMT Location of the provider: [facility], [place] Location of the patient: [facility] ED  This consult was provided via telemedicine with 2-way video and audio communication. The patient unable to be informed that care would be provided in this way and unable to give consent to receive care in this manner due to mental status    Chief Complaint: Unresponsive  HPI: 82 y.o. male who is unable to provide any history due to being unresponsive.  Family not available.  History obtained from EMS and chart.  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  EMS called.  On their arrival pupils reactive and with upper extremity movement with deep sternal rub.  On arrival the ED was unresponsive to deep sternal rub and hypoxic.  Left pupil was dilated and unresponsive.  Right pupil was constricted.  Code stroke activated.    LKW: 04/01/2023 @ 1800 tpa given?: No, SAH IR Thrombectomy? No, SAH Modified Rankin Scale: 1-No significant post stroke disability and can perform usual duties with stroke symptoms Time of teleneurologist evaluation: 1908  ICH Score: 5       Exam: Vitals:   04/01/23 1857 04/01/23 1905  BP: (!) 170/91   Pulse: 84   Resp: 19   SpO2: 100% 100%    General: Intubated  1A: Level of Consciousness - 3 1B: Ask Month and Age - 2 1C: 'Blink Eyes' & 'Squeeze Hands' - 2 2: Test Horizontal Extraocular Movements - 0 3: Test Visual Fields - 0 4: Test Facial Palsy - 0 5A: Test Left Arm Motor Drift - 4 5B: Test Right Arm Motor Drift - 4 6A: Test Left Leg Motor Drift - 4 6B: Test Right Leg Motor Drift - 4 7: Test Limb Ataxia - 0 8: Test Sensation - 2 9: Test Language/Aphasia- 3 10: Test Dysarthria - 2 11: Test Extinction/Inattention - 0 NIHSS score:  30   Imaging Reviewed:  CT HEAD WITHOUT CONTRAST  TECHNIQUE: Contiguous axial images were obtained from the base of the skull through the vertex without intravenous contrast.  RADIATION DOSE REDUCTION: This exam was performed according to the departmental dose-optimization program which includes automated exposure control, adjustment of the mA and/or kV according to patient size and/or use of iterative reconstruction technique.  COMPARISON: Head CT 02/13/2023.  FINDINGS: Brain:  Generalized cerebral atrophy.  Moderate-volume acute subarachnoid hemorrhage, greatest within the basal cisterns, MCA cisterns and interhemispheric fissure. 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 There is intraventricular extension of hemorrhage which is moderate-volume within the lateral ventricles and large-volume within the third and fourth ventricles. Moderate lateral and third ventriculomegaly consistent with hydrocephalus. As before, the giant aneurysm exerts mass effect upon the surrounding brain parenchyma, particularly upon the midbrain.  No demarcated cortical infarct.  No evidence of an intracranial mass.  Vascular: No hyperdense vessel. Atherosclerotic calcifications. 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  Skull: No calvarial fracture or aggressive osseous lesion.  Sinuses/Orbits: No mass or acute finding within the imaged orbits.  Other: Mild mucosal thickening within the bilateral ethmoid sinuses. Moderate mucosal thickening within the right maxillary sinus. Mild-to-moderate polypoid mucosal thickening within the left maxillary sinus.  ASPECTS [facility] Stroke Program Early CT Score)  - Ganglionic level infarction (caudate, lentiform nuclei, internal  capsule, insula, M1-M3 cortex): 7  - Supraganglionic infarction (M4-M6 cortex): 3  Total score (0-10 with 10 being normal): 10  These results  were called by telephone at the time of interpretation on 04/01/2023 at 7:32 pm to provider PHILLIP STAFFORD , who verbally acknowledged these results.  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 Intraventricular extension of hemorrhage which is moderate-volume in the lateral ventricles and large-volume within the third and fourth ventricles. Moderate lateral and third ventriculomegaly consistent with hydrocephalus. As before, the giant aneurysm exerts mass effect upon the surrounding brain parenchyma. Most notably, there is prominent mass effect upon the midbrain (which could contribute to the patient's hydrocephalus).   CT ANGIOGRAPHY HEAD AND NECK WITH AND WITHOUT CONTRAST   TECHNIQUE: Multidetector CT imaging of the head and neck was performed using the standard protocol during bolus administration of intravenous contrast. Multiplanar CT image reconstructions and MIPs were obtained to evaluate the vascular anatomy. Carotid stenosis measurements (when applicable) are obtained utilizing NASCET criteria, using the distal internal carotid diameter as the denominator.   RADIATION DOSE REDUCTION: This exam was performed according to the departmental dose-optimization program which includes automated exposure control, adjustment of the mA and/or kV according to patient size and/or use of iterative reconstruction technique.   CONTRAST:  75mL OMNIPAQUE IOHEXOL 350 MG/ML SOLN   COMPARISON:  Noncontrast head CT performed earlier today 04/01/2023.   FINDINGS: CTA NECK FINDINGS   Aortic arch: Standard aortic branching. Atherosclerotic plaque within the visualized thoracic aorta and proximal major branch vessels of the neck. No hemodynamically significant innominate or proximal subclavian artery stenosis. The   Right carotid system: CCA and ICA patent within the neck  without stenosis. Mild atherosclerotic plaque scattered within the CCA, about the carotid bifurcation and within the ICA.   Left carotid system: CCA and ICA patent within the neck without stenosis. Mild for age atherosclerotic plaque at the carotid bifurcation. Fusiform dilation of the mid cervical ICA (measuring up to 10 mm in diameter) (for instance as seen on series 6, image 172).   Vertebral arteries: Patent within the neck. The right vertebral artery is dominant. Mild atherosclerotic narrowing at the origin of the left vertebral artery.   Skeleton: Spondylosis of the cervical and visualized upper thoracic levels. No acute fracture or aggressive osseous lesion.   Other neck: Atrophy of the left thyroid lobe. No neck mass or cervical lymphadenopathy.   Upper chest: Irregular opacity within the partially imaged right lower lobe suspicious for aspiration. An ET tube terminates above the level of the carina.   Review of the MIP images confirms the above findings   CTA HEAD FINDINGS   Anterior circulation:   The intracranial internal carotid arteries are patent.   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 The left middle cerebral artery proximal and mid M1 segment is dilated to 9 mm in diameter. There is diminished enhancement within the left middle cerebral artery and bilateral anterior cerebral arteries, presumably due to altered flow dynamics. The right middle cerebral artery M1 segment is patent. No right M2 proximal branch occlusion or high-grade proximal stenosis. The anterior cerebral arteries are patent. 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   Posterior circulation:   The intracranial vertebral arteries  are patent. The non dominant left vertebral artery is developmentally diminutive beyond the  PICA origin. The basilar artery is patent. Moderate relative narrowing of the mid and distal basilar artery, which may reflect vasospasm or anatomic variation. The posterior cerebral arteries are patent. Fetal origin PCA on the right. A left posterior communicating artery is present.   Venous sinuses: There is limited evaluation for dural venous sinus thrombosis due to contrast timing.   Anatomic variants: As described.   Review of the MIP images confirms the above findings   CTA head findings called by telephone at the time of interpretation on 04/01/2023 at 8:06 pm to provider Dr. Scotty Court, who verbally acknowledged these results.   IMPRESSION: CTA neck:   1. The common carotid and internal carotid arteries are patent within the neck without stenosis. Atherosclerotic plaque bilaterally as described. 2. Fusiform dilation of the mid cervical left ICA (measuring up to 10 mm in diameter). 3. Vertebral arteries patent within the neck. Mild atherosclerotic narrowing at the left vertebral artery origin. 4. Patchy pulmonary opacities within the partially imaged right lower lobe, suspicious for aspiration.   CTA head:   1. 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 The left middle cerebral artery proximal and mid M1 segment is dilated to 9 mm in diameter. There is diminished enhancement within the left middle cerebral artery, and bilateral anterior cerebral arteries, presumably due to altered flow dynamics (related to the giant aneurysm). 2. 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 3. The patient's acute subarachnoid hemorrhage may be due to rupture of either of the above described aneurysms. 4. Moderate relative narrowing of the mid and distal basilar artery, which may reflect vasospasm or anatomic variation.  Labs reviewed in epic and pertinent values follow: Glucose  186   Assessment: 82 y.o. male with a history of PAF on no anticoagulation, DM, HLD, HTN and giant cerebral aneurysm who was sitting with family and acutely became unresponsive with sonorous respirations.  EMS called.  On their arrival pupils reactive and with upper extremity movement with deep sternal rub.  On arrival the ED was unresponsive to deep sternal rub and hypoxic.  Left pupil was dilated and unresponsive.  Right pupil was constricted.  Patient required intubation.  Head CT personally reviewed and reveals SAH with intraventricular extension and hydrocephalus.  Giant aneurysm noted on CT and CTA of the head and neck as well that was personally reviewed as well as 6mm anterior communicating artery aneurysm..   Due to ICH patient not a candidate for thrombolytics or thrombectomy.     Recommendations:  BP control as per [facility] protocol order set Neurosurgery consult   Case discussed with Dr. Scotty Court    This patient is receiving care for possible acute neurological changes. There was 45 minutes of care by this provider at the time of service, including time for direct evaluation via telemedicine, review of medical records, imaging studies and discussion of findings with providers.  Thana Farr, MD Neurology   If 7pm- 7am, please page neurology on call as listed in AMION.

## 2023-04-01 NOTE — ED Notes (Signed)
MD stated not to place OG, Pt is comfort care

## 2023-04-01 NOTE — ED Triage Notes (Addendum)
BIBMES, coming from home. LKN: 1 hour ago-6pm. Pt went unresponsive out of nowhere with snoring respirations witnessed by spouse. Pt vomited x1 with EMS. Pt not responsive to painful stimuli. 170/100, HR 90, sats 70%  on RA, 15L NRB 100%. BGL: 199

## 2023-04-01 NOTE — ED Notes (Signed)
Code stroke  called to  carelink  at  6:58 pm  per  Dr  Modesto Charon  MD

## 2023-04-01 NOTE — ED Provider Notes (Signed)
[facility] Provider Note    Event Date/Time   First MD Initiated Contact with Patient 04/01/23 1902     (approximate)   History   Chief Complaint: Loss of consciousness  HPI  Scott Buckley is a 82 y.o. male with a history of diabetes, hypertension, known cerebral aneurysms, early dementia and Parkinson's disease who is brought to the ED due to rapid onset unresponsiveness.  Patient was in his usual state of health today, when he had deteriorating level of consciousness, vomited once.  Afterward has been having deep respirations but not arousable per EMS.  They note that on scene with their initial evaluation of the patient, pupils were equal.          Physical Exam   Triage Vital Signs: ED Triage Vitals [04/01/23 1857]  Encounter Vitals Group     BP (!) 170/91     Systolic BP Percentile      Diastolic BP Percentile      Pulse Rate 84     Resp 19     Temp      Temp src      SpO2 100 %     Weight      Height      Head Circumference      Peak Flow      Pain Score      Pain Loc      Pain Education      Exclude from Growth Chart     Most recent vital signs: Vitals:   04/01/23 1857 04/01/23 1905  BP: (!) 170/91   Pulse: 84   Resp: 19   SpO2: 100% 100%    General: Obtunded.  Breathing spontaneously, flexor posturing to pain with left lower extremity.  No response to pain in other extremities.  Right pupil 2 mm, left pupil 6 mm, unreactive.  Intact corneal reflex bilaterally.  No response to confrontation. GCS equals E1, V1, M3 = 5 CV:  Good peripheral perfusion.  Regular rate and rhythm Resp:  Normal effort.  Deep respirations Abd:  No distention.  Soft nontender Other:  1+ pitting edema bilateral lower extremities.  No signs of trauma   ED Results / Procedures / Treatments   Labs (all labs ordered are listed, but only abnormal results are displayed) Labs Reviewed  CBC - Abnormal; Notable for the following components:       Result Value   WBC 15.9 (*)    RBC 3.88 (*)    Hemoglobin 11.5 (*)    HCT 35.8 (*)    All other components within normal limits  DIFFERENTIAL - Abnormal; Notable for the following components:   Neutro Abs 10.6 (*)    Abs Immature Granulocytes 0.27 (*)    All other components within normal limits  COMPREHENSIVE METABOLIC PANEL - Abnormal; Notable for the following components:   Potassium 2.9 (*)    Glucose, Bld 186 (*)    Calcium 8.3 (*)    Total Protein 6.4 (*)    Albumin 3.3 (*)    GFR, Estimated 58 (*)    All other components within normal limits  ETHANOL  PROTIME-INR  APTT  URINE DRUG SCREEN, QUALITATIVE ([facility] ONLY)  URINALYSIS, ROUTINE W REFLEX MICROSCOPIC     EKG    RADIOLOGY CT head interpreted by me, shows large intracranial hemorrhage.  Radiology report reviewed.   PROCEDURES:  .Critical Care  Performed by: Sharman Cheek, MD Authorized by: Sharman Cheek, MD   Critical  care provider statement:    Critical care time (minutes):  35   Critical care time was exclusive of:  Separately billable procedures and treating other patients   Critical care was necessary to treat or prevent imminent or life-threatening deterioration of the following conditions:  Sepsis and respiratory failure   Critical care was time spent personally by me on the following activities:  Development of treatment plan with patient or surrogate, discussions with consultants, evaluation of patient's response to treatment, examination of patient, obtaining history from patient or surrogate, ordering and performing treatments and interventions, ordering and review of laboratory studies, ordering and review of radiographic studies, pulse oximetry, re-evaluation of patient's condition and review of old charts Comments:        Procedure Name: Intubation Date/Time: 04/01/2023 7:10 PM  Performed by: Sharman Cheek, MDPre-anesthesia Checklist: Patient identified, Patient being monitored,  Emergency Drugs available, Timeout performed and Suction available Oxygen Delivery Method: Non-rebreather mask Preoxygenation: Pre-oxygenation with 100% oxygen Induction Type: Rapid sequence Ventilation: Mask ventilation without difficulty Laryngoscope Size: Glidescope and 3 Grade View: Grade I Tube size: 7.5 mm Number of attempts: 1 Airway Equipment and Method: Video-laryngoscopy Placement Confirmation: ETT inserted through vocal cords under direct vision, CO2 detector and Breath sounds checked- equal and bilateral Secured at: 23 cm Tube secured with: ETT holder Dental Injury: Teeth and Oropharynx as per pre-operative assessment        MEDICATIONS ORDERED IN ED: Medications  etomidate (AMIDATE) injection (20 mg Intravenous Given 04/01/23 1859)  fentaNYL (SUBLIMAZE) injection (80 mcg Intravenous Not Given 04/01/23 1859)  niMODipine (NIMOTOP) capsule 60 mg (60 mg Oral Not Given 04/01/23 2015)  rocuronium (ZEMURON) injection (80 mg Intravenous Given 04/01/23 1859)  iohexol (OMNIPAQUE) 350 MG/ML injection 75 mL (75 mLs Intravenous Contrast Given 04/01/23 1925)     IMPRESSION / MDM / ASSESSMENT AND PLAN / ED COURSE  I reviewed the triage vital signs and the nursing notes.  DDx: Intracranial hemorrhage, acute stroke, intoxication, electrolyte abnormality  Patient's presentation is most consistent with acute presentation with potential threat to life or bodily function.  Patient presents with sudden altered mental status, arrives comatose with unequal pupils, minimal pain response.  Intubated on arrival for airway protection, code stroke initiated   Clinical Course as of 04/01/23 2047  Tue Apr 01, 2023  1941 Discussed with neurology, noting acute hemorrhage, will start antihypertensives.  Discussed with radiology, noting rupture of left ICA aneurysm with hydrocephalus and mass effect on the midbrain. [PS]    Clinical Course User Index [PS] Sharman Cheek, MD      ----------------------------------------- 8:02 PM on 04/01/2023 ----------------------------------------- After discussion with neurosurgery, I discussed the patient's condition and outlook with his son and spouse.  They note that he has previously been evaluated at [facility] for intervention of his multiple cerebral aneurysms, which have been found to be nonintervenable.  They had been told that there was nothing they could do and that he would have to live with these aneurysms and the risk of rupture.  They agree with transitioning to comfort care at this time, understanding that meaningful recovery is a very remote possibility.  ----------------------------------------- 8:47 PM on 04/01/2023 ----------------------------------------- Case discussed with ICU team who will evaluate and discuss with family regarding timing of extubation and further comfort measures.  FINAL CLINICAL IMPRESSION(S) / ED DIAGNOSES   Final diagnoses:  Nontraumatic ruptured cerebral aneurysm (HCC)     Rx / DC Orders   ED Discharge Orders     None  Note:  This document was prepared using Dragon voice recognition software and may include unintentional dictation errors.   Sharman Cheek, MD 04/01/23 2047

## 2023-04-01 NOTE — ED Notes (Signed)
Etomidate 20 mg given

## 2023-04-01 NOTE — ED Notes (Signed)
23 @lip , 7.5 tube. + color change

## 2023-04-02 ENCOUNTER — Encounter: Payer: Self-pay | Admitting: Pulmonary Disease

## 2023-04-02 ENCOUNTER — Other Ambulatory Visit: Payer: Self-pay

## 2023-04-02 DIAGNOSIS — I606 Nontraumatic subarachnoid hemorrhage from other intracranial arteries: Secondary | ICD-10-CM

## 2023-04-02 DIAGNOSIS — I609 Nontraumatic subarachnoid hemorrhage, unspecified: Secondary | ICD-10-CM | POA: Diagnosis not present

## 2023-04-02 DIAGNOSIS — Z66 Do not resuscitate: Secondary | ICD-10-CM

## 2023-04-02 DIAGNOSIS — Z515 Encounter for palliative care: Secondary | ICD-10-CM

## 2023-04-02 MED ORDER — ACETAMINOPHEN 650 MG RE SUPP: 650.0000 mg | Freq: Four times a day (QID) | RECTAL | Status: DC | PRN

## 2023-04-02 MED ORDER — ONDANSETRON HCL 4 MG/2ML IJ SOLN: 4.0000 mg | Freq: Four times a day (QID) | INTRAMUSCULAR | Status: DC | PRN

## 2023-04-02 MED ORDER — ONDANSETRON 4 MG PO TBDP: 4.0000 mg | ORAL_TABLET | Freq: Four times a day (QID) | ORAL | Status: DC | PRN

## 2023-04-02 MED ORDER — MORPHINE 100MG IN NS 100ML (1MG/ML) PREMIX INFUSION
0.0000 mg/h | INTRAVENOUS | Status: DC
  Administered 2023-04-02: 12 mg/h via INTRAVENOUS
  Administered 2023-04-02: 5 mg/h via INTRAVENOUS
  Administered 2023-04-02: 10 mg/h via INTRAVENOUS
  Filled 2023-04-02 (×3): qty 100

## 2023-04-02 MED ORDER — GLYCOPYRROLATE 0.2 MG/ML IJ SOLN: 0.2000 mg | INTRAMUSCULAR | Status: DC | PRN

## 2023-04-02 MED ORDER — LORAZEPAM 2 MG/ML IJ SOLN: 2.0000 mg | INTRAMUSCULAR | Status: DC | PRN

## 2023-04-02 MED ORDER — GLYCOPYRROLATE 1 MG PO TABS: 1.0000 mg | ORAL_TABLET | ORAL | Status: DC | PRN

## 2023-04-02 MED ORDER — ACETAMINOPHEN 325 MG PO TABS: 650.0000 mg | ORAL_TABLET | Freq: Four times a day (QID) | ORAL | Status: DC | PRN

## 2023-04-02 MED ORDER — GLYCOPYRROLATE 0.2 MG/ML IJ SOLN
0.2000 mg | INTRAMUSCULAR | Status: DC | PRN
  Administered 2023-04-02: 0.2 mg via INTRAVENOUS
  Filled 2023-04-02: qty 1

## 2023-04-02 MED ORDER — MORPHINE BOLUS VIA INFUSION
5.0000 mg | INTRAVENOUS | Status: DC | PRN
  Administered 2023-04-02: 5 mg via INTRAVENOUS

## 2023-04-02 MED ORDER — ALBUTEROL SULFATE (2.5 MG/3ML) 0.083% IN NEBU
2.5000 mg | INHALATION_SOLUTION | RESPIRATORY_TRACT | Status: DC | PRN
  Administered 2023-04-02: 2.5 mg via RESPIRATORY_TRACT
  Filled 2023-04-02: qty 3

## 2023-04-02 MED ORDER — POLYVINYL ALCOHOL 1.4 % OP SOLN: 1.0000 [drp] | Freq: Four times a day (QID) | OPHTHALMIC | Status: DC | PRN

## 2023-04-02 NOTE — Progress Notes (Signed)
    OVERNIGHT PROGRESS REPORT  Notified by RN that patient is deceased as of 04/13/2023 Apr 18, 2223  Patient was comfort care  2 RN verified.  Family was at bedside.     Abdulrahim Siddiqi Lamin Geradine Girt, MSN, APRN, AGACNP-BC [facility] [place] Pager: [phone]. Check Amion for Availability

## 2023-04-02 NOTE — ED Notes (Addendum)
Pt repositioned and neck roll applied. 4 liter's [address] applied for comfort per family request.

## 2023-04-02 NOTE — H&P (Signed)
History and Physical    [name] WJX:[id number] DOB: 1941/03/05 DOA: 04/01/2023  PCP: Wilford Corner, PA-C (Confirm with patient/family/NH records and if not entered, this has to be entered at [facility] point of entry) Patient coming from: Home  I have personally briefly reviewed patient's old medical records in [facility]  Chief Complaint: Patient in coma  HPI: Scott Buckley is a 82 y.o. male with medical history significant of brain aneurysm, HTN, HLD, IIDM, BPH, chronic pancreatic insufficiency, brought in by family for mentation changes.  Patient was found sitting in the couch unresponsive with heavy snoring yesterday evening.  EMS arrived and found the patient unresponsive to painful stimuli, blood pressure significant elevated to 170s by 100, hypoxic O2 saturation 70% on room air and patient was put on nonrebreather with saturation recovered to 100%.  ED found patient hypoxic and performed intubation and patient connected to ventilator.  Initial ED workup screening CT head showed massive SAH, likely secondary to ruptured known brain aneurysm.  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  Goals of care discussed with family by ICU team, family agreed with not pursuing aggressive management but switch to full comfort care.  Patient made DNR.  Subsequently patient was extubated.  Review of Systems: Unable to perform, patient is in comatose  Past Medical History:  Diagnosis Date   Aneurysm (HCC)    Cancer (HCC)    Diabetes mellitus without complication (HCC)    Hypertension    Renal disorder     Past Surgical History:  Procedure Laterality Date   CYSTOSCOPY WITH INSERTION OF UROLIFT N/A 02/22/2022   Procedure: CYSTOSCOPY WITH INSERTION OF UROLIFT;  Surgeon: Sondra Come, MD;  Location: [facility] ORS;  Service: Urology;  Laterality: N/A;   HERNIA REPAIR     JOINT REPLACEMENT Bilateral    hip replaced and Left knee   TOTAL HIP  REVISION       reports that he has been smoking cigars. He has been exposed to tobacco smoke. He has never used smokeless tobacco. He reports that he does not use drugs. No history on file for alcohol use.  Allergies  Allergen Reactions   Metformin Diarrhea   Pancrelipase (Lip-Prot-Amyl)     Other Reaction(s): Dizziness   Sulfa Antibiotics Nausea And Vomiting and Other (See Comments)    Patient states not allergic   Varicella Virus Vaccine Live Other (See Comments)    Cramps and weakness   Zoster Vac Recomb Adjuvanted     Other reaction(s): Not available   Zoster Vaccine Recombinant, Adjuvanted     Other Reaction(s): Unknown  Other reaction(s): Not available    Family History  Problem Relation Age of Onset   Hyperlipidemia Mother    Hypertension Mother    Diabetes Mother    Hypertension Father    Heart disease Father    Diabetes Brother      Prior to Admission medications   Medication Sig Start Date End Date Taking? Authorizing Provider  acetaminophen (TYLENOL) 650 MG CR tablet Take 650 mg by mouth every 8 (eight) hours as needed.    [provider]  CREON 36000-114000 units CPEP capsule Take 1 capsule (36,000 Units total) by mouth 4 (four) times daily -  before meals and at bedtime. 12/17/22   Marrion Coy, MD  CREON [phone] units CPEP capsule Take 1-3 capsules (36,000-108,000 Units total) by mouth 4 (four) times daily. 01/10/23  cyanocobalamin (VITAMIN B12) 1000 MCG tablet Take by mouth.    [provider]  Cysteamine Bitartrate (PROCYSBI) 300 MG PACK Use 1 each once daily Use as instructed. 12/25/22 12/25/23  [provider]  finasteride (PROSCAR) 5 MG tablet Take 1 tablet (5 mg total) by mouth daily. 01/02/23   Sondra Come, MD  hydrocortisone 2.5 % cream Apply topically 2 (two) times daily. 10/11/22 10/11/23  [provider]  lamoTRIgine (LAMICTAL) 25 MG tablet Take 50 mg by mouth at bedtime. 07/01/22 03/26/23  [provider]  Lancets Letta Pate DELICA PLUS LANCET30G) MISC  12/25/22   [provider]  lipase/protease/amylase (CREON) 36000 UNITS CPEP capsule Take1-3 capsules by mouth 4 (four) times daily. 01/10/23     lisinopril (PRINIVIL,ZESTRIL) 20 MG tablet Take 10 mg by mouth daily.    [provider]  loperamide (IMODIUM) 2 MG capsule Take 1 capsule (2 mg total) by mouth 3 (three) times daily as needed for diarrhea or loose stools. 12/17/22   Marrion Coy, MD  Multiple Vitamin (MULTIVITAMIN) tablet Take 1 tablet by mouth daily.    [provider]  mupirocin ointment (BACTROBAN) 2 % Apply 1 Application topically 3 (three) times daily. 11/25/22   [provider]  ondansetron (ZOFRAN-ODT) 4 MG disintegrating tablet Take 1 tablet (4 mg total) by mouth every 8 (eight) hours as needed. 09/12/22   Delton Prairie, MD  pantoprazole (PROTONIX) 40 MG tablet Take 1 tablet (40 mg total) by mouth daily for 14 days. 12/18/22 01/01/23  Marrion Coy, MD  PRECISION QID TEST test strip Use 2 (two) times daily 12/25/22   [provider]  rosuvastatin (CRESTOR) 20 MG tablet Take 20 mg by mouth daily.    [provider]  saccharomyces boulardii (FLORASTOR) 250 MG capsule Take 1 capsule (250 mg total) by mouth 2 (two) times daily. 12/17/22   Marrion Coy, MD  tamsulosin (FLOMAX) 0.4 MG CAPS capsule Take 1 capsule (0.4 mg total) by mouth daily after supper. 01/02/23   Sondra Come, MD    Physical Exam: Vitals:   03/26/2023 0615 04/04/2023 0630 03/17/2023 0645 03/23/2023 0700  BP:      Pulse: (!) 39 (!) 38 (!) 39 (!) 40  Resp:   11 16  SpO2:        Constitutional: NAD, calm, comfortable Vitals:   03/17/2023 0615 04/05/2023 0630 03/17/2023 0645 03/18/2023 0700  BP:      Pulse: (!) 39 (!) 38 (!) 39 (!) 40  Resp:   11 16  SpO2:       Eyes: Pupils are sluggish, lids and conjunctivae normal ENMT: Mucous membranes are moist. Posterior pharynx clear of any exudate or lesions.Normal dentition.  Neck:  normal, supple, no masses, no thyromegaly Respiratory: clear to auscultation bilaterally, no wheezing, no crackles. Normal respiratory effort. No accessory muscle use.  Deep and slow breathing Cardiovascular: Regular rate and rhythm, no murmurs / rubs / gallops. No extremity edema. 2+ pedal pulses. No carotid bruits.  Abdomen: no tenderness, no masses palpated. No hepatosplenomegaly. Bowel sounds positive.  Musculoskeletal: no clubbing / cyanosis. No joint deformity upper and lower extremities. Good ROM, no contractures. Normal muscle tone.  Skin: no rashes, lesions, ulcers. No induration Neurologic: No facial droops, not moving limbs Psychiatric: in Coma    Labs on Admission: I have personally reviewed following labs and imaging studies  CBC: Recent Labs  Lab 04/01/23 1904  WBC 15.9*  NEUTROABS 10.6*  HGB 11.5*  HCT 35.8*  MCV 92.3  PLT 235   Basic Metabolic Panel: Recent Labs  Lab 04/01/23 1904  NA 141  K 2.9*  CL 106  CO2 24  GLUCOSE 186*  BUN 22  CREATININE 1.23  CALCIUM 8.3*   GFR: CrCl cannot be calculated (Unknown ideal weight.). Liver Function Tests: Recent Labs  Lab 04/01/23 1904  AST 23  ALT 14  ALKPHOS 53  BILITOT 0.6  PROT 6.4*  ALBUMIN 3.3*   No results for input(s): "LIPASE", "AMYLASE" in the last 168 hours. No results for input(s): "AMMONIA" in the last 168 hours. Coagulation Profile: Recent Labs  Lab 04/01/23 1904  INR 1.2   Cardiac Enzymes: No results for input(s): "CKTOTAL", "CKMB", "CKMBINDEX", "TROPONINI" in the last 168 hours. BNP (last 3 results) No results for input(s): "PROBNP" in the last 8760 hours. HbA1C: No results for input(s): "HGBA1C" in the last 72 hours. CBG: No results for input(s): "GLUCAP" in the last 168 hours. Lipid Profile: No results for input(s): "CHOL", "HDL", "LDLCALC", "TRIG", "CHOLHDL", "LDLDIRECT" in the last 72 hours. Thyroid Function Tests: No results for input(s): "TSH", "T4TOTAL", "FREET4",  "T3FREE", "THYROIDAB" in the last 72 hours. Anemia Panel: No results for input(s): "VITAMINB12", "FOLATE", "FERRITIN", "TIBC", "IRON", "RETICCTPCT" in the last 72 hours. Urine analysis:    Component Value Date/Time   COLORURINE YELLOW (A) 03/25/2023 1853   APPEARANCEUR HAZY (A) 03/25/2023 1853   APPEARANCEUR Clear 09/27/2022 1308   LABSPEC 1.017 03/25/2023 1853   LABSPEC 1.058 04/14/2014 0212   PHURINE 5.0 03/25/2023 1853   GLUCOSEU NEGATIVE 03/25/2023 1853   GLUCOSEU 50 mg/dL 34/74/2595 6387   HGBUR NEGATIVE 03/25/2023 1853   BILIRUBINUR NEGATIVE 03/25/2023 1853   BILIRUBINUR Negative 09/27/2022 1308   BILIRUBINUR Negative 04/14/2014 0212   KETONESUR NEGATIVE 03/25/2023 1853   PROTEINUR NEGATIVE 03/25/2023 1853   NITRITE NEGATIVE 03/25/2023 1853   LEUKOCYTESUR NEGATIVE 03/25/2023 1853   LEUKOCYTESUR Negative 04/14/2014 0212    Radiological Exams on Admission: CT ANGIO HEAD NECK W WO CM (CODE STROKE) Result Date: 04/01/2023 CLINICAL DATA:  Provided history: Neuro deficit, acute, stroke suspected. EXAM: CT ANGIOGRAPHY HEAD AND NECK WITH AND WITHOUT CONTRAST TECHNIQUE: Multidetector CT imaging of the head and neck was performed using the standard protocol during bolus administration of intravenous contrast. Multiplanar CT image reconstructions and MIPs were obtained to evaluate the vascular anatomy. Carotid stenosis measurements (when applicable) are obtained utilizing NASCET criteria, using the distal internal carotid diameter as the denominator. RADIATION DOSE REDUCTION: This exam was performed according to the departmental dose-optimization program which includes automated exposure control, adjustment of the mA and/or kV according to patient size and/or use of iterative reconstruction technique. CONTRAST:  75mL OMNIPAQUE IOHEXOL 350 MG/ML SOLN COMPARISON:  Noncontrast head CT performed earlier today 04/01/2023. FINDINGS: CTA NECK FINDINGS Aortic arch: Standard aortic branching.  Atherosclerotic plaque within the visualized thoracic aorta and proximal major branch vessels of the neck. No hemodynamically significant innominate or proximal subclavian artery stenosis. The Right carotid system: CCA and ICA patent within the neck without stenosis. Mild atherosclerotic plaque scattered within the CCA, about the carotid bifurcation and within the ICA. Left carotid system: CCA and ICA patent within the neck without stenosis. Mild for age atherosclerotic plaque at the carotid bifurcation. Fusiform dilation of the mid cervical ICA (measuring up to 10 mm in diameter) (for instance as seen on series 6, image 172). Vertebral arteries: Patent within the neck. The right vertebral artery is dominant. Mild atherosclerotic narrowing at the origin of the left vertebral  artery. Skeleton: Spondylosis of the cervical and visualized upper thoracic levels. No acute fracture or aggressive osseous lesion. Other neck: Atrophy of the left thyroid lobe. No neck mass or cervical lymphadenopathy. Upper chest: Irregular opacity within the partially imaged right lower lobe suspicious for aspiration. An ET tube terminates above the level of the carina. Review of the MIP images confirms the above findings CTA HEAD FINDINGS Anterior circulation: The intracranial internal carotid arteries are patent. 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 The left middle cerebral artery proximal and mid M1 segment is dilated to 9 mm in diameter. There is diminished enhancement within the left middle cerebral artery and bilateral anterior cerebral arteries, presumably due to altered flow dynamics. The right middle cerebral artery M1 segment is patent. No right M2 proximal branch occlusion or high-grade proximal stenosis. The anterior cerebral arteries are patent. 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 Posterior circulation: The intracranial vertebral arteries are patent. The non dominant left vertebral artery is developmentally diminutive beyond the PICA origin. The basilar artery is patent. Moderate relative narrowing of the mid and distal basilar artery, which may reflect vasospasm or anatomic variation. The posterior cerebral arteries are patent. Fetal origin PCA on the right. A left posterior communicating artery is present. Venous sinuses: There is limited evaluation for dural venous sinus thrombosis due to contrast timing. Anatomic variants: As described. Review of the MIP images confirms the above findings CTA head findings called by telephone at the time of interpretation on 04/01/2023 at 8:06 pm to provider Dr. Scotty Court, who verbally acknowledged these results. IMPRESSION: CTA neck: 1. The common carotid and internal carotid arteries are patent within the neck without stenosis. Atherosclerotic plaque bilaterally as described. 2. Fusiform dilation of the mid cervical left ICA (measuring up to 10 mm in diameter). 3. Vertebral arteries patent within the neck. Mild atherosclerotic narrowing at the left vertebral artery origin. 4. Patchy pulmonary opacities within the partially imaged right lower lobe, suspicious for aspiration. CTA head: 1. 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 The left middle cerebral artery proximal and mid M1 segment is dilated to 9 mm in diameter. There is diminished enhancement within the left middle cerebral artery, and bilateral anterior cerebral arteries, presumably due to altered flow dynamics (related to the giant aneurysm). 2. 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 3. The patient's acute subarachnoid hemorrhage may be due to rupture of either of the above described aneurysms. 4. Moderate relative narrowing of the mid and distal basilar artery,  which may reflect vasospasm or anatomic variation. Electronically Signed   By: Jackey Loge D.O.   On: 04/01/2023 20:11   CT HEAD CODE STROKE WO CONTRAST Result Date: 04/01/2023 CLINICAL DATA:  Code stroke. Neuro deficit, acute, stroke suspected. EXAM: CT HEAD WITHOUT CONTRAST TECHNIQUE: Contiguous axial images were obtained from the base of the skull through the vertex without intravenous contrast. RADIATION DOSE REDUCTION: This exam was performed according to the departmental dose-optimization program which includes automated exposure control, adjustment of the mA and/or kV according to patient size and/or use of iterative reconstruction technique. COMPARISON:  Head CT 02/13/2023. FINDINGS: Brain: Generalized cerebral atrophy. Moderate-volume acute subarachnoid hemorrhage, greatest within the basal cisterns, MCA cisterns and  interhemispheric fissure. 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 There is intraventricular extension of hemorrhage which is moderate-volume within the lateral ventricles and large-volume within the third and fourth ventricles. Moderate lateral and third ventriculomegaly consistent with hydrocephalus. As before, the giant aneurysm exerts mass effect upon the surrounding brain parenchyma, particularly upon the midbrain. No demarcated cortical infarct. No evidence of an intracranial mass. Vascular: No hyperdense vessel. Atherosclerotic calcifications. 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 Skull: No calvarial fracture or aggressive osseous lesion. Sinuses/Orbits: No mass or acute finding within the imaged orbits. Other: Mild mucosal thickening within the bilateral ethmoid sinuses. Moderate mucosal thickening within the right maxillary sinus. Mild-to-moderate polypoid mucosal thickening within the left maxillary sinus. ASPECTS (Alberta Stroke Program Early CT Score) - Ganglionic level infarction (caudate,  lentiform nuclei, internal capsule, insula, M1-M3 cortex): 7 - Supraganglionic infarction (M4-M6 cortex): 3 Total score (0-10 with 10 being normal): 10 These results were called by telephone at the time of interpretation on 04/01/2023 at 7:32 pm to provider PHILLIP STAFFORD , who verbally acknowledged these results. 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 Intraventricular extension of hemorrhage which is moderate-volume in the lateral ventricles and large-volume within the third and fourth ventricles. Moderate lateral and third ventriculomegaly consistent with hydrocephalus. As before, the giant aneurysm exerts mass effect upon the surrounding brain parenchyma. Most notably, there is prominent mass effect upon the midbrain (which could contribute to the patient's hydrocephalus). Electronically Signed   By: Jackey Loge D.O.   On: 04/01/2023 19:39   DG Chest Portable 1 View Result Date: 04/01/2023 CLINICAL DATA:  Post intubation EXAM: PORTABLE CHEST 1 VIEW COMPARISON:  02/13/2023 FINDINGS: Endotracheal tube with tip measuring 5.8 cm above the carina. Shallow inspiration. Cardiac enlargement. Lungs are clear. No pleural effusions. No pneumothorax. Mediastinal contours appear intact. Degenerative changes in the spine. IMPRESSION: Endotracheal tube appears in satisfactory position. Cardiac enlargement. No active pulmonary disease. Electronically Signed   By: Burman Nieves M.D.   On: 04/01/2023 19:25    EKG: Independently reviewed.  Chronic RBBB, no acute ST changes.  Assessment/Plan Principal Problem:   SAH (subarachnoid hemorrhage) (HCC)  (please populate well all problems here in Problem List. (For example, if patient is on BP meds at home and you resume or decide to hold them, it is a problem that needs to be her. Same for CAD, COPD, HLD and so on)  Acute encephalopathy, GCS=5 Massive  SAH -Secondary to rupture of unknown giant cell aneurysm -Prognosis grave, expected patient will pass away in few hours to 1 to 2 days.  Family made aware and family agreed with pursuing full comfort care.  Continue morphine drip and as needed Ativan.   -Admit to inpatient hospice, hospice team to follow this morning.  HTN BPH IIDM -Discontinue home medications  DVT prophylaxis: None Code Status: DNR/DNI Family Communication: Wife and children at bedside Disposition Plan: Patient is sick with massive SAH and comatose, expect patient will possibly possibly in few hours to 1 to 2 days. Consults called: Neurology, ICU, palliative care Admission status: MedSurg admission   Emeline General MD [facility] Pager [phone]  03/09/2023, 8:08 AM

## 2023-04-02 NOTE — ED Notes (Signed)
RT at bedside to extubate.

## 2023-04-02 NOTE — ED Notes (Signed)
Patient's family asked for a couple of recliners, some shasta cola, socks, and warm blankets and were provided with the same.

## 2023-04-02 NOTE — Progress Notes (Signed)
   03/18/2023 2300  Spiritual Encounters  Type of Visit Follow up  Care provided to: Pt and family  Conversation partners present during encounter Nurse  Referral source Nurse (RN/NT/LPN)  Reason for visit Patient death  OnCall Visit Yes  Spiritual Framework  Presenting Themes Rituals and practive;Impactful experiences and emotions;Courage hope and growth   Chaplain received a call for EOL. Chaplain went to offer a comforting presence and words of hope. Family was happy to see the chaplain again and glad for her visit. Chaplain had to leave for an emergency call. Chaplain will try to follow up with the family before they leave.

## 2023-04-02 NOTE — ED Notes (Signed)
Provider contacted to inform that family is ready to extubate.

## 2023-04-02 NOTE — ED Notes (Signed)
Pt extubated at this time

## 2023-04-02 NOTE — Consult Note (Signed)
BRIEF CONSULT NOTE  Patient Name: Scott Buckley   MRN: [id number]   Date of Birth/ Sex: 06/20/1940 , male      Admission Date: 04/01/2023  Attending Provider: Janann Colonel, MD  Primary Diagnosis: SAH (subarachnoid hemorrhage) (HCC)    Brysun Dylla is a 82 y.o. male with a history of grade 1 diastolic dysfunction, PAF, HLD, multiple cerebral, carotid and iliac artery aneurysms, T2DM, hypertension, CKD3, recurrent diarrhea, Parkinson Disease, seizure, urinary retention, and a distant history of prostate cancer and melanoma in remission who presented to the ED with   Per family, patient was feeling well this morning until about 6 pm last evening when he suddenly became unresponsive with sonorous respirations.  On EMS arrival, pt was found unresponsive to noxious stimuli. He vomited x 1 per EMS. Initial VS 170/100, HR 90, sats 70% on RA, 15L NRB 100%. BGL: 199.  On arrival to the ED, patient was obtunded.  Breathing spontaneously, flexor posturing to pain with left lower extremity.  No response to pain in other extremities.  Right pupil 2 mm, left pupil 6 mm, unreactive.  Intact corneal reflex bilaterally.  No response to confrontation. GCS equals E1, V1, M3 = 5. Code stroke was called and patient evaluated by Neurologist. He was emergently intubated for airway protection. STAT CT and CTA head and Neck obtained which showed acute subarachnoid hemorrhage due to rupture of either the 1. 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 The left middle cerebral artery proximal and mid M1 segment is dilated to 9 mm in diameter. There is diminished enhancement within the left middle cerebral artery, and bilateral anterior cerebral arteries, presumably due to altered flow dynamics (related to the giant aneurysm). 2. 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 Due to ICH patient was deemed not a candidate  for thrombolytics or thrombectomy.  PCCM consulted to assist with disposition given he was intubated with devasting finding on CTH.  Goals of care discussed with patient's family at the bedside. Patient's family wishes not to pursue ongoing treatment, and concurred that if deteriorated to pulselessness, patient would prefer a natural death as opposed to invasive measures such as CPR/Defibrillation. The requested that patient be made DNR and transitioned to full comfort care including terminal extubation. See IPAL note. Patient extubated  at 2:55 am and transitioned to comfort care. Family at the bedside.   Webb Silversmith, DNP, CCRN, FNP-C, AGACNP-BC Acute Care & Family Nurse Practitioner  [facility]  See Amion for personal pager PCCM on call pager [phone] until 7 am

## 2023-04-02 NOTE — Progress Notes (Signed)
Pt was unresponsive in bed, no spontaneous respiratory or cardiac activity noted. Heart and lung sounds were absent. Pt's family and this nurse was at bedside during this time. 2 Rns were present for death assessment. Time of death was 2223-05-05.Hospitalist on-call was notified of death. Chaplain was also contacted to speak with pt's family.

## 2023-04-02 NOTE — Progress Notes (Signed)
   03/28/2023 1100  Spiritual Encounters  Type of Visit Initial  Care provided to: Pt and family  Referral source Chaplain assessment  Reason for visit End-of-life  OnCall Visit Yes  Spiritual Framework  Presenting Themes Meaning/purpose/sources of inspiration;Values and beliefs;Coping tools;Impactful experiences and emotions;Courage hope and growth  Community/Connection Family;Significant other  Patient Stress Factors Health changes   Chaplain received a spiritual consult for EOL. Chaplain went and met with the family and offered them words of hope and comfort. Chaplain prayed with the family and told them that chaplain services will continue to be available for emotional and spiritual support.

## 2023-04-02 NOTE — Consult Note (Signed)
Palliative Care Consult Note                                  Date: 03/26/2023   Patient Name: Scott Buckley  DOB: [birth year]  MRN: [id number]  Age / Sex: 82 y.o., male  PCP: Wilford Corner, PA-C Referring Physician: No att. providers found  Reason for Consultation: Terminal Care  HPI/Patient Profile: 82 y.o. male  with past medical history of  brain aneurysm, HTN, HLD, IIDM, BPH, chronic pancreatic insufficiency, brought in by family for mentation changes.  Patient was found sitting in the couch unresponsive with heavy snoring yesterday evening.  EMS arrived and found the patient unresponsive to painful stimuli, blood pressure significant elevated to 170s by 100, hypoxic O2 saturation 70% on room air and patient was put on nonrebreather with saturation recovered to 100%.    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  Family was clear but they did not desire curative treatment but rather comfort care.  He was admitted on 04/01/2023 with acute encephalopathy, massive SAH, comfort care.   Palliative medicine was consulted for comfort care and end-of-life care.  Past Medical History:  Diagnosis Date   Aneurysm (HCC)    Cancer (HCC)    Diabetes mellitus without complication (HCC)    Hypertension    Renal disorder     Subjective:   This NP [name] reviewed medical records, received report from team, assessed the patient and then meet at the patient's bedside to discuss diagnosis, prognosis, GOC, EOL wishes disposition and options.  I met with the patient at the bedside, although he is unable to communicate.  Also present at the bedside with the patient's wife Okey Regal, son [name], daughter [name], [place] husband.   We meet to discuss diagnosis prognosis, GOC, EOL wishes, disposition and options. Concept of Palliative Care was introduced as specialized medical care for people and  their families living with serious illness.  If focuses on providing relief from the symptoms and stress of a serious illness.  The goal is to improve quality of life for both the patient and the family. Values and goals of care important to patient and family were attempted to be elicited.  Created space and opportunity for patient  and family to explore thoughts and feelings regarding current medical situation   Natural trajectory and current clinical status were discussed. Questions and concerns addressed. Patient  encouraged to call with questions or concerns.    Patient/Family Understanding of Illness: They know that he has had a brain aneurysm and a new that they rupture was coming, but did not know when.  They understand he has had a very large brain bleed and this is not survivable.  Life Review: The patient and his wife Okey Regal have been married for 61 years.  They met the [employer] (now [employer] where the patient was on scholarship for baseball and basketball.  They have 3 children and his wife describes that they have had a "wonderful life".  She notes jokingly that he is quite a bit stubborn and she does spoil him.  She states that she is cared for him diligently for home and has not had much sleep over the past 2 years because she refused to let him go to a rest home.  The patient ran [employer] for many years.  His wife works at [employer]  industries, later for a Clinical research associate, and then finally for [employer] where she retired at age 60.  The patient enjoyed watching sports, being involved with family activities such as children's sports, he played recreational softball and basketball for a number of years.  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  Patient Values: Family, faith  Goals: Comfort, peace, dignity at the end of life  Today's Discussion: In addition to  discussions described above we had extensive discussion on various topics.  We discussed what is entailed with comfort care. I explained comfort care as care where the patient would no longer receive aggressive medical interventions such as continuous vital signs, lab work, radiology testing, or medications not focused on comfort, peace, and dignity. This includes stopping antibiotics and weaning oxygen to room air, as these are generally not accepted as providing comfort but only prolonging the dying process artificially. All care would focus on how the patient is looking and feeling. This would include management of any symptoms that may cause discomfort, pain, shortness of breath/air hunger, increased work of breathing, cough, nausea, agitation/restlessness, anxiety, and/or secretions etc. Symptoms would be managed with medications and other non-pharmacological interventions such as spiritual support if requested, repositioning, music therapy, or therapeutic listening. Family verbalized understanding and agreement.   We discussed what can be expected as the patient approaches end-of-life.  To help support these conversations I provided a copy of the book "gone from my sight: The dying experience".  I answered questions around his variable heart rate and bradycardia, intermittently irregular breathing rhythms, and explained that these are all part of the natural dying process.  We spoke with the patient and his wife [name].  She shares that she has been praying to God quite a lot lately.  We spent some time reminiscing about their life together as well.  I offered support through a spiritual care consult and they have excepted.  I shared that palliative medicine will continue to follow as long as the patient is admitted to the hospital.  We will help promote peace, comfort, dignity.  I shared that they should reach out to the nursing staff if they see anything with the patient that they feel is "not right" so  that appropriate medications and interventions can be provided.  I provided emotional and general support through therapeutic listening, empathy, sharing of stories, therapeutic touch, and other techniques. I answered all questions and addressed all concerns to the best of my ability.  Review of Systems  Unable to perform ROS: Acuity of condition    Objective:   Primary Diagnoses: Present on Admission: **None**   Physical Exam Vitals and nursing note reviewed.  Constitutional:      General: He is sleeping. He is not in acute distress. HENT:     Head: Normocephalic and atraumatic.  Cardiovascular:     Comments: Variable heart rate as low as 28 bpm Pulmonary:     Effort: Pulmonary effort is normal. No respiratory distress.     Comments: Intermittently irregular breathing pattern, occasional brief apneic pauses, no respiratory distress apparent Abdominal:     General: Abdomen is flat.  Skin:    General: Skin is warm and dry.  Neurological:     Mental Status: He is unresponsive.     Vital Signs:  BP (!) 170/91   Pulse (!) 45   Temp 97.9 F (36.6 C)   Resp 16  SpO2 97%   Palliative Assessment/Data: 10%    Advanced Care Planning:   Existing Vynca/ACP Documentation: DNR effective 12/17/2022  Primary Decision Maker: NEXT OF KIN  Code Status/Advance Care Planning: DNR-comfort  A discussion was had today regarding advanced directives. Concepts specific to code status, artifical feeding and hydration, continued IV antibiotics and rehospitalization was had.  The difference between a aggressive medical intervention path and a palliative comfort care path for this patient at this time was had.   Decisions/Changes to ACP: None today  Assessment & Plan:   Impression: 82 year old male with acute presentation chronic comorbidities as described above.  The patient has unfortunately suffered a devastating subarachnoid hemorrhage related to likely rupture of known  aneurysm.  Family is not surprised that this is happening, they were expecting it.  However, we discussed that this is not make end-of-life any easier.  The patient appears comfortable, family has been prepped for what may be seen and heard as he approaches end-of-life.  They have elected for comfort care and end-of-life care.  Palliative medicine will continue to follow.  Prognosis is grave.  SUMMARY OF RECOMMENDATIONS   DNR-comfort For comfort care See symptom management orders below Spiritual care consult for spiritual support Continued emotional support of patient's family Palliative medicine will continue to follow daily  Symptom Management:  Tylenol 650 mg PR every 6 hours as needed fever Robinul 0.2 mg IV every 4 hours as needed excessive secretions Ativan 2 to 4 mg IV every 4 hours as needed anxiety or seizure Morphine infusion 0 to 20 mg/h titrate for pain, respiratory distress Morphine bolus via infusion 5 mg IV every 5 minutes as needed uncontrolled pain, tachypnea greater than 18 breaths/min Zofran 4 mg IV every 6 hours as needed nausea Polyvinyl alcohol 1.4% ophthalmic 1 drop OU 4 times daily as needed dry eyes  Prognosis:  Hours - Days  Discharge Planning:  Anticipated Hospital Death   Discussed with: Patient's family, medical team, nursing team    Thank you for allowing [place] to participate in the care of AARONJAMES ENCISO PMT will continue to support holistically.  Time Total: 60 min  Detailed review of medical records (labs, imaging, vital signs), medically appropriate exam, discussed with treatment team, counseling and education to patient, family, & staff, documenting clinical information, medication management, coordination of care  Signed by: [name], NP Palliative Medicine Team  Team Phone # [phone] (Nights/Weekends)  03/31/2023, 10:39 AM

## 2023-04-02 NOTE — ED Notes (Signed)
ED transport requested at this time

## 2023-04-02 NOTE — ED Notes (Signed)
Assumed patient care at approximately 0705 and received report from the previous nurse. Spoke with the family and checked on the patient.

## 2023-04-02 NOTE — ED Notes (Signed)
Med at bedside to speak with family. Pt is becoming brady between 30-45. MD is aware.

## 2023-04-08 ENCOUNTER — Other Ambulatory Visit: Payer: Self-pay

## 2023-04-09 NOTE — Discharge Summary (Signed)
Physician Discharge Summary   Patient: Scott Buckley MRN: [id number] DOB: 1941-03-12  Admit date:     26-Apr-2023  Discharge date: 04-28-23  Discharge Physician: Emeline General   PCP: Wilford Corner, PA-C     Discharge Diagnoses: Principal Problem:   SAH (subarachnoid hemorrhage) [facility])    Hospital Course:  83 year old male patient with history of brain aneurysm, HTN, HLD, IIDM, BPH, brought in by family for after mentations.  Patient quickly deteriorated to coma on arrival in ED. stat CT head showed moderate volume of acute subarachnoid hemorrhage likely secondary to acute brain aneurysm rupture with significant hydrocephalus and mass effect on midbrain.  Goal of care was discussed with family by ICU team and family agreed with not pursuing aggressive management and patient switched to full comfort care and patient passed away on 22:24 2023/04/28.   Consultants: Neurology, ICU team Procedures performed: None Disposition: Patient deceased  DISCHARGE MEDICATION: Allergies as of 2023-04-28       Reactions   Metformin Diarrhea   Pancrelipase (lip-prot-amyl)    Other Reaction(s): Dizziness   Sulfa Antibiotics Nausea And Vomiting, Other (See Comments)   Patient states not allergic   Varicella Virus Vaccine Live Other (See Comments)   Cramps and weakness   Zoster Vac Recomb Adjuvanted    Other reaction(s): Not available   Zoster Vaccine Recombinant, Adjuvanted    Other Reaction(s): Unknown Other reaction(s): Not available        Medication List     ASK your doctor about these medications    acetaminophen 650 MG CR tablet Commonly known as: TYLENOL Take 650 mg by mouth every 8 (eight) hours as needed.   cyanocobalamin 1000 MCG tablet Commonly known as: VITAMIN B12 Take by mouth.   hydrocortisone 2.5 % cream Apply topically 2 (two) times daily.   lamoTRIgine 25 MG tablet Commonly known as: LAMICTAL Take 50 mg by mouth at bedtime.   lisinopril 20 MG  tablet Commonly known as: ZESTRIL Take 10 mg by mouth daily.   multivitamin tablet Take 1 tablet by mouth daily.   mupirocin ointment 2 % Commonly known as: BACTROBAN Apply 1 Application topically 3 (three) times daily.   OneTouch Delica Plus Lancet30G Misc   pantoprazole 40 MG tablet Commonly known as: PROTONIX Take 1 tablet (40 mg total) by mouth daily for 14 days.   Precision QID Test test strip Generic drug: glucose blood Use 2 (two) times daily   Procysbi 300 MG Pack Generic drug: Cysteamine Bitartrate Use 1 each once daily Use as instructed.   rosuvastatin 20 MG tablet Commonly known as: CRESTOR Take 20 mg by mouth daily.        Discharge Exam: There were no vitals filed for this visit. Pulseless, pupils are dilated and fixed.  No bruising some or heartbeat appreciated on physical exam  Condition at discharge: Deceased  The results of significant diagnostics from this hospitalization (including imaging, microbiology, ancillary and laboratory) are listed below for reference.   Imaging Studies: CT ANGIO HEAD NECK W WO CM (CODE STROKE) Result Date: April 26, 2023 CLINICAL DATA:  Provided history: Neuro deficit, acute, stroke suspected. EXAM: CT ANGIOGRAPHY HEAD AND NECK WITH AND WITHOUT CONTRAST TECHNIQUE: Multidetector CT imaging of the head and neck was performed using the standard protocol during bolus administration of intravenous contrast. Multiplanar CT image reconstructions and MIPs were obtained to evaluate the vascular anatomy. Carotid stenosis measurements (when applicable) are obtained utilizing NASCET criteria, using the distal internal carotid diameter as the denominator.  RADIATION DOSE REDUCTION: This exam was performed according to the departmental dose-optimization program which includes automated exposure control, adjustment of the mA and/or kV according to patient size and/or use of iterative reconstruction technique. CONTRAST:  75mL OMNIPAQUE IOHEXOL 350  MG/ML SOLN COMPARISON:  Noncontrast head CT performed earlier today 04-30-2023. FINDINGS: CTA NECK FINDINGS Aortic arch: Standard aortic branching. Atherosclerotic plaque within the visualized thoracic aorta and proximal major branch vessels of the neck. No hemodynamically significant innominate or proximal subclavian artery stenosis. The Right carotid system: CCA and ICA patent within the neck without stenosis. Mild atherosclerotic plaque scattered within the CCA, about the carotid bifurcation and within the ICA. Left carotid system: CCA and ICA patent within the neck without stenosis. Mild for age atherosclerotic plaque at the carotid bifurcation. Fusiform dilation of the mid cervical ICA (measuring up to 10 mm in diameter) (for instance as seen on series 6, image 172). Vertebral arteries: Patent within the neck. The right vertebral artery is dominant. Mild atherosclerotic narrowing at the origin of the left vertebral artery. Skeleton: Spondylosis of the cervical and visualized upper thoracic levels. No acute fracture or aggressive osseous lesion. Other neck: Atrophy of the left thyroid lobe. No neck mass or cervical lymphadenopathy. Upper chest: Irregular opacity within the partially imaged right lower lobe suspicious for aspiration. An ET tube terminates above the level of the carina. Review of the MIP images confirms the above findings CTA HEAD FINDINGS Anterior circulation: The intracranial internal carotid arteries are patent. 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 The left middle cerebral artery proximal and mid M1 segment is dilated to 9 mm in diameter. There is diminished enhancement within the left middle cerebral artery and bilateral anterior cerebral arteries, presumably due to altered flow dynamics. The right middle cerebral artery M1 segment is patent. No right M2 proximal branch occlusion or  high-grade proximal stenosis. The anterior cerebral arteries are patent. 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 Posterior circulation: The intracranial vertebral arteries are patent. The non dominant left vertebral artery is developmentally diminutive beyond the PICA origin. The basilar artery is patent. Moderate relative narrowing of the mid and distal basilar artery, which may reflect vasospasm or anatomic variation. The posterior cerebral arteries are patent. Fetal origin PCA on the right. A left posterior communicating artery is present. Venous sinuses: There is limited evaluation for dural venous sinus thrombosis due to contrast timing. Anatomic variants: As described. Review of the MIP images confirms the above findings CTA head findings called by telephone at the time of interpretation on Apr 30, 2023 at 8:06 pm to provider Dr. Scotty Court, who verbally acknowledged these results. IMPRESSION: CTA neck: 1. The common carotid and internal carotid arteries are patent within the neck without stenosis. Atherosclerotic plaque bilaterally as described. 2. Fusiform dilation of the mid cervical left ICA (measuring up to 10 mm in diameter). 3. Vertebral arteries patent within the neck. Mild atherosclerotic narrowing at the left vertebral artery origin. 4. Patchy pulmonary opacities within the partially imaged right lower lobe, suspicious for aspiration. CTA head: 1. 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 The left middle cerebral artery proximal and mid M1 segment is dilated to 9 mm in diameter. There is diminished enhancement within the  left middle cerebral artery, and bilateral anterior cerebral arteries, presumably due to altered flow dynamics (related to the giant aneurysm). 2. 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 3. The patient's acute  subarachnoid hemorrhage may be due to rupture of either of the above described aneurysms. 4. Moderate relative narrowing of the mid and distal basilar artery, which may reflect vasospasm or anatomic variation. Electronically Signed   By: Jackey Loge D.O.   On: 04/25/23 20:11   CT HEAD CODE STROKE WO CONTRAST Result Date: 04-25-23 CLINICAL DATA:  Code stroke. Neuro deficit, acute, stroke suspected. EXAM: CT HEAD WITHOUT CONTRAST TECHNIQUE: Contiguous axial images were obtained from the base of the skull through the vertex without intravenous contrast. RADIATION DOSE REDUCTION: This exam was performed according to the departmental dose-optimization program which includes automated exposure control, adjustment of the mA and/or kV according to patient size and/or use of iterative reconstruction technique. COMPARISON:  Head CT 02/13/2023. FINDINGS: Brain: Generalized cerebral atrophy. Moderate-volume acute subarachnoid hemorrhage, greatest within the basal cisterns, MCA cisterns and interhemispheric fissure. 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 There is intraventricular extension of hemorrhage which is moderate-volume within the lateral ventricles and large-volume within the third and fourth ventricles. Moderate lateral and third ventriculomegaly consistent with hydrocephalus. As before, the giant aneurysm exerts mass effect upon the surrounding brain parenchyma, particularly upon the midbrain. No demarcated cortical infarct. No evidence of an intracranial mass. Vascular: No hyperdense vessel. Atherosclerotic calcifications. 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 Skull: No calvarial fracture or aggressive osseous lesion. Sinuses/Orbits: No mass or acute finding within the imaged orbits. Other: Mild mucosal thickening within the bilateral ethmoid sinuses. Moderate mucosal thickening within the right maxillary sinus.  Mild-to-moderate polypoid mucosal thickening within the left maxillary sinus. ASPECTS (Alberta Stroke Program Early CT Score) - Ganglionic level infarction (caudate, lentiform nuclei, internal capsule, insula, M1-M3 cortex): 7 - Supraganglionic infarction (M4-M6 cortex): 3 Total score (0-10 with 10 being normal): 10 These results were called by telephone at the time of interpretation on 04/25/23 at 7:32 pm to provider PHILLIP STAFFORD , who verbally acknowledged these results. 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 Intraventricular extension of hemorrhage which is moderate-volume in the lateral ventricles and large-volume within the third and fourth ventricles. Moderate lateral and third ventriculomegaly consistent with hydrocephalus. As before, the giant aneurysm exerts mass effect upon the surrounding brain parenchyma. Most notably, there is prominent mass effect upon the midbrain (which could contribute to the patient's hydrocephalus). Electronically Signed   By: Jackey Loge D.O.   On: April 25, 2023 19:39   DG Chest Portable 1 View Result Date: 04-25-23 CLINICAL DATA:  Post intubation EXAM: PORTABLE CHEST 1 VIEW COMPARISON:  02/13/2023 FINDINGS: Endotracheal tube with tip measuring 5.8 cm above the carina. Shallow inspiration. Cardiac enlargement. Lungs are clear. No pleural effusions. No pneumothorax. Mediastinal contours appear intact. Degenerative changes in the spine. IMPRESSION: Endotracheal tube appears in satisfactory position. Cardiac enlargement. No active pulmonary disease. Electronically Signed   By: Burman Nieves M.D.   On: 04/25/23 19:25   CT ABDOMEN PELVIS W CONTRAST Result Date: 03/25/2023 CLINICAL DATA:  Acute abdominal pain EXAM: CT ABDOMEN AND PELVIS WITH CONTRAST TECHNIQUE: Multidetector CT imaging of the abdomen and pelvis was performed using the standard protocol  following bolus administration of intravenous contrast. RADIATION DOSE REDUCTION: This exam  was performed according to the departmental dose-optimization program which includes automated exposure control, adjustment of the mA and/or kV according to patient size and/or use of iterative reconstruction technique. CONTRAST:  80mL OMNIPAQUE IOHEXOL 300 MG/ML  SOLN COMPARISON:  CT chest abdomen and pelvis 12/12/2022. FINDINGS: Lower chest: There is atelectasis in the lung bases. Hepatobiliary: No focal liver abnormality is seen. No gallstones, gallbladder wall thickening, or biliary dilatation. Pancreas: Unremarkable. No pancreatic ductal dilatation or surrounding inflammatory changes. Spleen: Normal in size without focal abnormality. Adrenals/Urinary Tract: Moderate right hydronephrosis to the level of the bladder appears unchanged from prior study. There some tiny 1 mm layering calculi in the distal right ureter image 2/79, but the ureter remains dilated distal to this level. There is also mild left-sided hydroureteronephrosis to the level of the bladder without obstructing calculus identified. There are bilateral renal cysts similar to the prior examination measuring up to 1.7 cm. The bladder is distended with trabeculated bladder wall. This is unchanged. The adrenal glands are within normal limits. Stomach/Bowel: Stomach is within normal limits. Appendix appears normal. No evidence of bowel wall thickening, distention, or inflammatory changes. There is a large amount of stool throughout the colon. Vascular/Lymphatic: Aorto bi-iliac graft is present. No evidence for aneurysm. There are atherosclerotic calcifications throughout the aorta. No enlarged lymph nodes are seen. Reproductive: Prostate gland is enlarged and heterogeneous containing calcifications, unchanged. Other: There are small fat containing inguinal hernias. Patient is status post right inguinal hernia repair. There is thinning of the anterior abdominal  wall with bulging and rectus diastasis similar to the prior study. There is no ascites. Musculoskeletal: Degenerative changes affect the spine. There severe degenerative changes of the right hip. Left hip arthroplasty is present. IMPRESSION: 1. Stable moderate right and new mild left hydroureteronephrosis to the level of the bladder. There are tiny 1 mm layering calculi in the distal right ureter, but the ureter remains dilated distal to this level. This is of uncertain etiology. 2. Distended bladder with trabeculated bladder wall, unchanged. Findings may be related to chronic bladder outlet obstruction. 3. Enlarged prostate gland. 4. Large amount of stool throughout the colon. 5. Aortic atherosclerosis. Aortic Atherosclerosis (ICD10-I70.0). Electronically Signed   By: Darliss Cheney M.D.   On: 03/25/2023 21:50    Microbiology: Results for orders placed or performed during the hospital encounter of 03/25/23  Resp panel by RT-PCR (RSV, Flu A&B, Covid) Anterior Nasal Swab     Status: None   Collection Time: 03/25/23  3:20 PM   Specimen: Anterior Nasal Swab  Result Value Ref Range Status   SARS Coronavirus 2 by RT PCR NEGATIVE NEGATIVE Final    Comment: (NOTE) SARS-CoV-2 target nucleic acids are NOT DETECTED.  The SARS-CoV-2 RNA is generally detectable in upper respiratory specimens during the acute phase of infection. The lowest concentration of SARS-CoV-2 viral copies this assay can detect is 138 copies/mL. A negative result does not preclude SARS-Cov-2 infection and should not be used as the sole basis for treatment or other patient management decisions. A negative result may occur with  improper specimen collection/handling, submission of specimen other than nasopharyngeal swab, presence of viral mutation(s) within the areas targeted by this assay, and inadequate number of viral copies(<138 copies/mL). A negative result must be combined with clinical observations, patient history, and  epidemiological information. The expected result is Negative.  Fact Sheet for Patients:  BloggerCourse.com  Fact Sheet for Healthcare Providers:  SeriousBroker.it  This test is no t yet approved or cleared by  the [employer] and  has been authorized for detection and/or diagnosis of SARS-CoV-2 by FDA under an Emergency Use Authorization (EUA). This EUA will remain  in effect (meaning this test can be used) for the duration of the COVID-19 declaration under Section 564(b)(1) of the Act, 21 U.S.C.section 360bbb-3(b)(1), unless the authorization is terminated  or revoked sooner.       Influenza A by PCR NEGATIVE NEGATIVE Final   Influenza B by PCR NEGATIVE NEGATIVE Final    Comment: (NOTE) The Xpert Xpress SARS-CoV-2/FLU/RSV plus assay is intended as an aid in the diagnosis of influenza from Nasopharyngeal swab specimens and should not be used as a sole basis for treatment. Nasal washings and aspirates are unacceptable for Xpert Xpress SARS-CoV-2/FLU/RSV testing.  Fact Sheet for Patients: BloggerCourse.com  Fact Sheet for Healthcare Providers: SeriousBroker.it  This test is not yet approved or cleared by the [place] FDA and has been authorized for detection and/or diagnosis of SARS-CoV-2 by FDA under an Emergency Use Authorization (EUA). This EUA will remain in effect (meaning this test can be used) for the duration of the COVID-19 declaration under Section 564(b)(1) of the Act, 21 U.S.C. section 360bbb-3(b)(1), unless the authorization is terminated or revoked.     Resp Syncytial Virus by PCR NEGATIVE NEGATIVE Final    Comment: (NOTE) Fact Sheet for Patients: BloggerCourse.com  Fact Sheet for Healthcare Providers: SeriousBroker.it  This test is not yet approved or cleared by the [place] FDA and has been  authorized for detection and/or diagnosis of SARS-CoV-2 by FDA under an Emergency Use Authorization (EUA). This EUA will remain in effect (meaning this test can be used) for the duration of the COVID-19 declaration under Section 564(b)(1) of the Act, 21 U.S.C. section 360bbb-3(b)(1), unless the authorization is terminated or revoked.  Performed at [facility], [address] [place], [place] [address]     Labs: CBC: Recent Labs  Lab 04/01/23 1904  WBC 15.9*  NEUTROABS 10.6*  HGB 11.5*  HCT 35.8*  MCV 92.3  PLT 235   Basic Metabolic Panel: Recent Labs  Lab 04/01/23 1904  NA 141  K 2.9*  CL 106  CO2 24  GLUCOSE 186*  BUN 22  CREATININE 1.23  CALCIUM 8.3*   Liver Function Tests: Recent Labs  Lab 04/01/23 1904  AST 23  ALT 14  ALKPHOS 53  BILITOT 0.6  PROT 6.4*  ALBUMIN 3.3*   CBG: No results for input(s): "GLUCAP" in the last 168 hours.  Discharge time spent: less than 30 minutes.  Signed: Emeline General, MD [facility] 04/07/2023

## 2023-04-09 DEATH — deceased

## 2023-04-10 ENCOUNTER — Other Ambulatory Visit: Payer: Self-pay

## 2023-07-02 ENCOUNTER — Other Ambulatory Visit: Payer: Medicare HMO

## 2023-07-03 ENCOUNTER — Ambulatory Visit: Payer: Medicare HMO | Admitting: Urology

## 2023-07-21 ENCOUNTER — Other Ambulatory Visit: Payer: Self-pay

## 2023-10-21 ENCOUNTER — Ambulatory Visit (INDEPENDENT_AMBULATORY_CARE_PROVIDER_SITE_OTHER): Payer: Medicare HMO | Admitting: Vascular Surgery

## 2023-10-21 ENCOUNTER — Other Ambulatory Visit (INDEPENDENT_AMBULATORY_CARE_PROVIDER_SITE_OTHER): Payer: Medicare HMO
